# Patient Record
Sex: Female | Born: 1935 | Race: White | Hispanic: No | Smoking: Former smoker
Health system: Southern US, Community
[De-identification: ages and names within clinical notes are randomized; demographics above are authoritative.]

## PROBLEM LIST (undated history)

## (undated) DIAGNOSIS — F329 Major depressive disorder, single episode, unspecified: Secondary | ICD-10-CM

## (undated) DIAGNOSIS — Z860100 Personal history of colon polyps, unspecified: Secondary | ICD-10-CM

## (undated) DIAGNOSIS — Z87440 Personal history of urinary (tract) infections: Secondary | ICD-10-CM

## (undated) DIAGNOSIS — M1990 Unspecified osteoarthritis, unspecified site: Secondary | ICD-10-CM

## (undated) DIAGNOSIS — Z8619 Personal history of other infectious and parasitic diseases: Secondary | ICD-10-CM

## (undated) DIAGNOSIS — F32A Depression, unspecified: Secondary | ICD-10-CM

## (undated) DIAGNOSIS — H409 Unspecified glaucoma: Secondary | ICD-10-CM

## (undated) DIAGNOSIS — K219 Gastro-esophageal reflux disease without esophagitis: Secondary | ICD-10-CM

## (undated) HISTORY — DX: Major depressive disorder, single episode, unspecified: F32.9

## (undated) HISTORY — DX: Depression, unspecified: F32.A

## (undated) HISTORY — PX: OOPHORECTOMY: SHX86

## (undated) HISTORY — DX: Personal history of colonic polyps: Z86.010

## (undated) HISTORY — DX: Unspecified osteoarthritis, unspecified site: M19.90

## (undated) HISTORY — DX: Personal history of urinary (tract) infections: Z87.440

## (undated) HISTORY — DX: Personal history of colon polyps, unspecified: Z86.0100

## (undated) HISTORY — DX: Unspecified glaucoma: H40.9

## (undated) HISTORY — DX: Personal history of other infectious and parasitic diseases: Z86.19

## (undated) HISTORY — PX: EYE SURGERY: SHX253

## (undated) HISTORY — DX: Gastro-esophageal reflux disease without esophagitis: K21.9

---
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
## 1972-05-20 HISTORY — PX: APPENDECTOMY: SHX54

## 1972-05-20 HISTORY — PX: ABDOMINAL HYSTERECTOMY: SHX81

## 1986-05-20 HISTORY — PX: BREAST SURGERY: SHX581

## 1986-05-20 HISTORY — PX: BREAST BIOPSY: SHX20

## 1994-05-20 HISTORY — PX: CHOLECYSTECTOMY: SHX55

## 1994-05-20 HISTORY — PX: NISSEN FUNDOPLICATION: SHX2091

## 1996-05-20 HISTORY — PX: ANKLE SURGERY: SHX546

## 1998-05-20 HISTORY — PX: LAPAROSCOPIC GASTRIC BANDING WITH HIATAL HERNIA REPAIR: SHX6351

## 2004-03-27 ENCOUNTER — Ambulatory Visit: Payer: Self-pay | Admitting: Internal Medicine

## 2005-04-23 ENCOUNTER — Ambulatory Visit: Payer: Self-pay | Admitting: Internal Medicine

## 2005-09-09 ENCOUNTER — Ambulatory Visit: Payer: Self-pay | Admitting: Unknown Physician Specialty

## 2006-04-29 ENCOUNTER — Ambulatory Visit: Payer: Self-pay | Admitting: Internal Medicine

## 2007-05-04 ENCOUNTER — Ambulatory Visit: Payer: Self-pay | Admitting: Internal Medicine

## 2007-05-07 ENCOUNTER — Ambulatory Visit: Payer: Self-pay | Admitting: Internal Medicine

## 2007-11-06 ENCOUNTER — Ambulatory Visit: Payer: Self-pay | Admitting: Internal Medicine

## 2008-05-05 ENCOUNTER — Ambulatory Visit: Payer: Self-pay | Admitting: Internal Medicine

## 2008-12-23 ENCOUNTER — Ambulatory Visit: Payer: Self-pay | Admitting: Unknown Physician Specialty

## 2009-05-08 ENCOUNTER — Ambulatory Visit: Payer: Self-pay | Admitting: Internal Medicine

## 2009-12-26 ENCOUNTER — Ambulatory Visit: Payer: Self-pay | Admitting: Unknown Physician Specialty

## 2010-03-22 ENCOUNTER — Ambulatory Visit: Payer: Self-pay | Admitting: Otolaryngology

## 2010-04-25 ENCOUNTER — Encounter: Payer: Self-pay | Admitting: Otolaryngology

## 2010-05-20 ENCOUNTER — Encounter: Payer: Self-pay | Admitting: Otolaryngology

## 2010-06-11 ENCOUNTER — Ambulatory Visit: Payer: Self-pay | Admitting: Internal Medicine

## 2011-06-13 ENCOUNTER — Ambulatory Visit: Payer: Self-pay | Admitting: Internal Medicine

## 2012-06-15 ENCOUNTER — Ambulatory Visit: Payer: Self-pay | Admitting: Internal Medicine

## 2012-06-18 ENCOUNTER — Ambulatory Visit: Payer: Self-pay | Admitting: Internal Medicine

## 2013-01-15 ENCOUNTER — Ambulatory Visit (INDEPENDENT_AMBULATORY_CARE_PROVIDER_SITE_OTHER): Payer: Medicare Other | Admitting: Internal Medicine

## 2013-01-15 ENCOUNTER — Encounter: Payer: Self-pay | Admitting: Internal Medicine

## 2013-01-15 VITALS — BP 120/60 | HR 80 | Temp 98.2°F | Ht 64.0 in | Wt 178.0 lb

## 2013-01-15 DIAGNOSIS — H409 Unspecified glaucoma: Secondary | ICD-10-CM

## 2013-01-15 DIAGNOSIS — I6529 Occlusion and stenosis of unspecified carotid artery: Secondary | ICD-10-CM

## 2013-01-15 DIAGNOSIS — F3289 Other specified depressive episodes: Secondary | ICD-10-CM

## 2013-01-15 DIAGNOSIS — F329 Major depressive disorder, single episode, unspecified: Secondary | ICD-10-CM

## 2013-01-15 DIAGNOSIS — Z86010 Personal history of colonic polyps: Secondary | ICD-10-CM

## 2013-01-15 DIAGNOSIS — K219 Gastro-esophageal reflux disease without esophagitis: Secondary | ICD-10-CM

## 2013-01-15 DIAGNOSIS — F32A Depression, unspecified: Secondary | ICD-10-CM

## 2013-01-15 DIAGNOSIS — M129 Arthropathy, unspecified: Secondary | ICD-10-CM

## 2013-01-15 DIAGNOSIS — M1990 Unspecified osteoarthritis, unspecified site: Secondary | ICD-10-CM

## 2013-01-18 ENCOUNTER — Encounter: Payer: Self-pay | Admitting: Internal Medicine

## 2013-01-18 DIAGNOSIS — I6529 Occlusion and stenosis of unspecified carotid artery: Secondary | ICD-10-CM | POA: Insufficient documentation

## 2013-01-18 DIAGNOSIS — M1990 Unspecified osteoarthritis, unspecified site: Secondary | ICD-10-CM | POA: Insufficient documentation

## 2013-01-18 DIAGNOSIS — I779 Disorder of arteries and arterioles, unspecified: Secondary | ICD-10-CM | POA: Insufficient documentation

## 2013-01-18 DIAGNOSIS — Z860100 Personal history of colon polyps, unspecified: Secondary | ICD-10-CM | POA: Insufficient documentation

## 2013-01-18 DIAGNOSIS — F329 Major depressive disorder, single episode, unspecified: Secondary | ICD-10-CM | POA: Insufficient documentation

## 2013-01-18 DIAGNOSIS — H409 Unspecified glaucoma: Secondary | ICD-10-CM | POA: Insufficient documentation

## 2013-01-18 DIAGNOSIS — F32A Depression, unspecified: Secondary | ICD-10-CM | POA: Insufficient documentation

## 2013-01-18 DIAGNOSIS — K219 Gastro-esophageal reflux disease without esophagitis: Secondary | ICD-10-CM | POA: Insufficient documentation

## 2013-01-18 NOTE — Progress Notes (Signed)
Subjective:    Patient ID: Bridget Briggs, female    DOB: November 15, 1930, 79 y.o.   MRN: [id number]  HPI 79 year old female with past history of GERD, colonic polyps, glaucoma and depression. She comes in today to follow up on these issues as well as to establish care.  She has been followed by Dr Randa Lynn. Also sees Dr Markham Jordan.  Reflux is controlled.  She has some arthritis.  Right hand is worse.  Is exercising.  Goes to the [facility] three days per week and is walking.  No cardiac symptoms with increased activity or exertion.  Breathing stable.  Sees Dr Alvester Morin for her glaucoma.  No bowel change or urine change.     Past Medical History  Diagnosis Date  . GERD (gastroesophageal reflux disease)   . Arthritis   . Depression   . History of chicken pox   . Glaucoma   . Hx: UTI (urinary tract infection)   . History of colon polyps     Outpatient Encounter Prescriptions as of 01/15/2013  Medication Sig Dispense Refill  . beta carotene w/minerals (OCUVITE) tablet Take 1 tablet by mouth daily.      . calcium-vitamin D (OSCAL WITH D) 500-200 MG-UNIT per tablet Take 1 tablet by mouth daily.      . diphenhydramine-acetaminophen (TYLENOL PM) 25-500 MG TABS Take 1 tablet by mouth at bedtime as needed.      . dorzolamide-timolol (COSOPT) 22.3-6.8 MG/ML ophthalmic solution Place 1 drop into both eyes 2 (two) times daily.      . fluocinolone (SYNALAR) 0.01 % external solution Apply topically 2 (two) times daily as needed.      . latanoprost (XALATAN) 0.005 % ophthalmic solution Place 1 drop into both eyes at bedtime.      . raloxifene (EVISTA) 60 MG tablet Take 60 mg by mouth daily.      . vitamin D, CHOLECALCIFEROL, 400 UNITS tablet Take 400 Units by mouth daily.       No facility-administered encounter medications on file as of 01/15/2013.    Review of Systems Patient denies any headache, lightheadedness or dizziness. No sinus or allergy symptoms.   No chest pain, tightness or palpitations.  No increased shortness  of breath, cough or congestion.  No nausea or vomiting.  Acid reflux controlled.  No abdominal pain or cramping.  No bowel change, such as diarrhea, constipation, BRBPR or melana.  No urine change.   Exercising.  Sees Dr Alvester Morin for her glaucoma.  Previously smoked.  Quit smoking 24 years ago.  Last colonoscopy 2011.  Had one small polyp removed.       Objective:   Physical Exam Filed Vitals:   01/15/13 1535  BP: 120/60  Pulse: 80  Temp: 98.2 F (28.50 C)   79 year old female in no acute distress.   HEENT:  Nares- clear.  Oropharynx - without lesions. NECK:  Supple.  Nontender.  No audible bruit.  HEART:  Appears to be regular. LUNGS:  No crackles or wheezing audible.  Respirations even and unlabored.  RADIAL PULSE:  Equal bilaterally.   ABDOMEN:  Soft, nontender.  Bowel sounds present and normal.  No audible abdominal bruit.    EXTREMITIES:  No increased edema present.  DP pulses palpable and equal bilaterally.          Assessment & Plan:  HEALTH MAINTENANCE.  Schedule her for a physical when due.  Obtain outside records for review.  Mammogram 06/18/12 (per her report - ok).  I spent 30 minutes with this patient and more than 50% of the time was spent in consultation regarding the above.

## 2013-01-18 NOTE — Assessment & Plan Note (Signed)
Reflux controlled

## 2013-01-18 NOTE — Assessment & Plan Note (Signed)
Previously noted to have some carotid stenosis.  Will schedule a carotid ultrasound to further evaluate.

## 2013-01-18 NOTE — Assessment & Plan Note (Signed)
Followed by Dr Bell.   

## 2013-01-18 NOTE — Assessment & Plan Note (Signed)
Right hand is worse.  Tylenol as directed.  Desires no further w/up at this point.

## 2013-01-18 NOTE — Assessment & Plan Note (Signed)
Doing well on no medications.  Follow.

## 2013-01-18 NOTE — Assessment & Plan Note (Signed)
Last colonoscopy 2011.  One polyp removed.  Followed by Dr Elliot.   

## 2013-03-23 ENCOUNTER — Encounter: Payer: Self-pay | Admitting: Internal Medicine

## 2013-04-19 ENCOUNTER — Ambulatory Visit (INDEPENDENT_AMBULATORY_CARE_PROVIDER_SITE_OTHER): Payer: Medicare Other | Admitting: Adult Health

## 2013-04-19 ENCOUNTER — Telehealth: Payer: Self-pay | Admitting: *Deleted

## 2013-04-19 ENCOUNTER — Other Ambulatory Visit (INDEPENDENT_AMBULATORY_CARE_PROVIDER_SITE_OTHER): Payer: Medicare Other

## 2013-04-19 VITALS — BP 126/74 | HR 82 | Temp 98.3°F | Resp 16 | Wt 178.0 lb

## 2013-04-19 DIAGNOSIS — K219 Gastro-esophageal reflux disease without esophagitis: Secondary | ICD-10-CM

## 2013-04-19 DIAGNOSIS — F32A Depression, unspecified: Secondary | ICD-10-CM

## 2013-04-19 DIAGNOSIS — I6529 Occlusion and stenosis of unspecified carotid artery: Secondary | ICD-10-CM

## 2013-04-19 DIAGNOSIS — Z86010 Personal history of colonic polyps: Secondary | ICD-10-CM

## 2013-04-19 DIAGNOSIS — F329 Major depressive disorder, single episode, unspecified: Secondary | ICD-10-CM

## 2013-04-19 DIAGNOSIS — Z9109 Other allergy status, other than to drugs and biological substances: Secondary | ICD-10-CM | POA: Insufficient documentation

## 2013-04-19 DIAGNOSIS — J329 Chronic sinusitis, unspecified: Secondary | ICD-10-CM

## 2013-04-19 DIAGNOSIS — F3289 Other specified depressive episodes: Secondary | ICD-10-CM

## 2013-04-19 DIAGNOSIS — M1990 Unspecified osteoarthritis, unspecified site: Secondary | ICD-10-CM

## 2013-04-19 LAB — COMPREHENSIVE METABOLIC PANEL
ALT: 17 U/L (ref 0–35)
Alkaline Phosphatase: 55 U/L (ref 39–117)
BUN: 14 mg/dL (ref 6–23)
Chloride: 106 mEq/L (ref 96–112)
Creatinine, Ser: 0.9 mg/dL (ref 0.4–1.2)
Glucose, Bld: 91 mg/dL (ref 70–99)
Total Bilirubin: 0.6 mg/dL (ref 0.3–1.2)

## 2013-04-19 LAB — LIPID PANEL
Cholesterol: 191 mg/dL (ref 0–200)
HDL: 60.5 mg/dL (ref >39.00)
LDL Cholesterol: 107 mg/dL — ABNORMAL HIGH (ref 0–99)
VLDL: 23.2 mg/dL (ref 0.0–40.0)

## 2013-04-19 LAB — CBC WITH DIFFERENTIAL/PLATELET
Basophils Absolute: 0 10*3/uL (ref 0.0–0.1)
Basophils Relative: 0.4 % (ref 0.0–3.0)
Eosinophils Relative: 1.1 % (ref 0.0–5.0)
HCT: 43.9 % (ref 36.0–46.0)
Lymphs Abs: 2.1 10*3/uL (ref 0.7–4.0)
MCV: 87.3 fl (ref 78.0–100.0)
Monocytes Absolute: 0.6 10*3/uL (ref 0.1–1.0)
RBC: 5.03 Mil/uL (ref 3.87–5.11)
WBC: 9.1 10*3/uL (ref 4.5–10.5)

## 2013-04-19 LAB — TSH: TSH: 4.14 u[IU]/mL (ref 0.35–5.50)

## 2013-04-19 MED ORDER — FLUTICASONE PROPIONATE 50 MCG/ACT NA SUSP: 2.0000 | Freq: Every day | NASAL | Status: DC

## 2013-04-19 NOTE — Patient Instructions (Signed)
  Start flonase nasal spray 2 sprays into each nostril daily. Use only for one week.  Gargle with salt water solution or use chloraseptic spray for your throat. You can also try the lozenges.  Tylenol for general discomfort.  If you develop a cough you can use either Robitussin or Delsym  If you develop a fever or if your secretions become green colored please let us know.

## 2013-04-19 NOTE — Assessment & Plan Note (Signed)
Suspect post nasal drip causing sore throat. Salt water gargles, chloraseptic spray or lozenges for sore throat relief, tylenol for general discomfort. May take OTC cough medication. Will try flonase nasal spray for short term.

## 2013-04-19 NOTE — Progress Notes (Signed)
Pre visit review using our clinic review tool, if applicable. No additional management support is needed unless otherwise documented below in the visit note. 

## 2013-04-19 NOTE — Progress Notes (Signed)
   Subjective:    Patient ID: Bridget Briggs, female    DOB: 08-09-1930, 79 y.o.   MRN: [id number]  HPI Patient is a pleasant 79 year old female who presents to clinic with complaints of a sore throat and nasal congestion. She reports that the pain in her throat woke her up during the night. She has a mild cough. She denies fever or chills.   Current Outpatient Prescriptions on File Prior to Visit  Medication Sig Dispense Refill  . beta carotene w/minerals (OCUVITE) tablet Take 1 tablet by mouth daily.      . calcium-vitamin D (OSCAL WITH D) 500-200 MG-UNIT per tablet Take 1 tablet by mouth daily.      . diphenhydramine-acetaminophen (TYLENOL PM) 25-500 MG TABS Take 1 tablet by mouth at bedtime as needed.      . dorzolamide-timolol (COSOPT) 22.3-6.8 MG/ML ophthalmic solution Place 1 drop into both eyes 2 (two) times daily.      . fluocinolone (SYNALAR) 0.01 % external solution Apply topically 2 (two) times daily as needed.      . latanoprost (XALATAN) 0.005 % ophthalmic solution Place 1 drop into both eyes at bedtime.      . raloxifene (EVISTA) 60 MG tablet Take 60 mg by mouth daily.      . vitamin D, CHOLECALCIFEROL, 400 UNITS tablet Take 400 Units by mouth daily.       No current facility-administered medications on file prior to visit.    Review of Systems  Constitutional: Negative for fever and chills.  HENT: Positive for congestion, postnasal drip, rhinorrhea and sore throat.   Respiratory: Positive for cough. Negative for wheezing.        Objective:   Physical Exam  Constitutional: She is oriented to person, place, and time. She appears well-developed and well-nourished. No distress.  HENT:  Head: Normocephalic and atraumatic.  Left Ear: External ear normal.  Right ear canal with cerumen build up. TM not visualized. Mild pharyngeal erythema. No exudate.  Cardiovascular: Normal rate, regular rhythm and normal heart sounds.   Pulmonary/Chest: Effort normal and breath sounds  normal. No respiratory distress. She has no wheezes. She has no rales.  Lymphadenopathy:    She has no cervical adenopathy.  Neurological: She is alert and oriented to person, place, and time.  Skin: Skin is warm and dry.  Psychiatric: She has a normal mood and affect. Her behavior is normal. Judgment and thought content normal.    BP 126/74  Pulse 82  Temp(Src) 98.3 F (36.8 C) (Oral)  Resp 16  Wt 178 lb (80.74 kg)  SpO2 94%       Assessment & Plan:

## 2013-04-19 NOTE — Telephone Encounter (Signed)
What labs and dx?  

## 2013-04-19 NOTE — Telephone Encounter (Signed)
Orders placed for labs

## 2013-04-20 ENCOUNTER — Encounter: Payer: Self-pay | Admitting: *Deleted

## 2013-04-29 ENCOUNTER — Encounter: Payer: Self-pay | Admitting: Internal Medicine

## 2013-04-29 ENCOUNTER — Ambulatory Visit (INDEPENDENT_AMBULATORY_CARE_PROVIDER_SITE_OTHER): Payer: Medicare Other | Admitting: Internal Medicine

## 2013-04-29 VITALS — BP 130/60 | HR 86 | Temp 97.8°F | Ht 64.0 in | Wt 179.2 lb

## 2013-04-29 DIAGNOSIS — F3289 Other specified depressive episodes: Secondary | ICD-10-CM

## 2013-04-29 DIAGNOSIS — F329 Major depressive disorder, single episode, unspecified: Secondary | ICD-10-CM

## 2013-04-29 DIAGNOSIS — K219 Gastro-esophageal reflux disease without esophagitis: Secondary | ICD-10-CM

## 2013-04-29 DIAGNOSIS — Z9109 Other allergy status, other than to drugs and biological substances: Secondary | ICD-10-CM

## 2013-04-29 DIAGNOSIS — Z860100 Personal history of colon polyps, unspecified: Secondary | ICD-10-CM

## 2013-04-29 DIAGNOSIS — I6529 Occlusion and stenosis of unspecified carotid artery: Secondary | ICD-10-CM

## 2013-04-29 DIAGNOSIS — M129 Arthropathy, unspecified: Secondary | ICD-10-CM

## 2013-04-29 DIAGNOSIS — M1990 Unspecified osteoarthritis, unspecified site: Secondary | ICD-10-CM

## 2013-04-29 DIAGNOSIS — H409 Unspecified glaucoma: Secondary | ICD-10-CM

## 2013-04-29 DIAGNOSIS — F32A Depression, unspecified: Secondary | ICD-10-CM

## 2013-04-29 DIAGNOSIS — Z1239 Encounter for other screening for malignant neoplasm of breast: Secondary | ICD-10-CM

## 2013-04-29 MED ORDER — RALOXIFENE HCL 60 MG PO TABS: 60.0000 mg | ORAL_TABLET | Freq: Every day | ORAL | Status: DC

## 2013-04-29 NOTE — Progress Notes (Signed)
Pre-visit discussion using our clinic review tool. No additional management support is needed unless otherwise documented below in the visit note.  

## 2013-04-29 NOTE — Patient Instructions (Signed)
Robitussin as directed.  Saline nasal spray - flush nose at least 2-3x/day.  Flonase nasal spray.  2 sprays each nostril one time per day.  Take in the evening.

## 2013-04-29 NOTE — Progress Notes (Signed)
Subjective:    Patient ID: Bridget Briggs, female    DOB: 01-12-1931, 79 y.o.   MRN: [id number]  HPI 79 year old female with past history of GERD, colonic polyps, glaucoma and depression. She comes in today to follow up on these issues as well as for a complete physical exam.   Sees Dr Markham Jordan.  Reflux is controlled.  She has some arthritis.  Right hand is worse.  Is exercising.  Goes to the [facility] three days per week and is walking.  No cardiac symptoms with increased activity or exertion.  Breathing stable.  Sees Dr Alvester Morin for her glaucoma.  No bowel change or urine change.  Some increased drainage.  Minimal cough.  Clear mucus.  No fever.  No chest congestion or tightness.  No sob.  No wheezing.     Past Medical History  Diagnosis Date  . GERD (gastroesophageal reflux disease)   . Arthritis   . Depression   . History of chicken pox   . Glaucoma   . Hx: UTI (urinary tract infection)   . History of colon polyps     Outpatient Encounter Prescriptions as of 04/29/2013  Medication Sig  . beta carotene w/minerals (OCUVITE) tablet Take 1 tablet by mouth daily.  . calcium-vitamin D (OSCAL WITH D) 500-200 MG-UNIT per tablet Take 1 tablet by mouth daily.  . diphenhydramine-acetaminophen (TYLENOL PM) 25-500 MG TABS Take 1 tablet by mouth at bedtime as needed.  . dorzolamide-timolol (COSOPT) 22.3-6.8 MG/ML ophthalmic solution Place 1 drop into both eyes 2 (two) times daily.  . fluocinolone (SYNALAR) 0.01 % external solution Apply topically 2 (two) times daily as needed.  . fluticasone (FLONASE) 50 MCG/ACT nasal spray Place 2 sprays into both nostrils daily.  Marland Kitchen latanoprost (XALATAN) 0.005 % ophthalmic solution Place 1 drop into both eyes at bedtime.  . raloxifene (EVISTA) 60 MG tablet Take 60 mg by mouth daily.  . vitamin D, CHOLECALCIFEROL, 400 UNITS tablet Take 400 Units by mouth daily.    Review of Systems Patient denies any headache, lightheadedness or dizziness. Some drainage and cough as  outlined.   No chest pain, tightness or palpitations.  No increased shortness of breath.  No nausea or vomiting.  Acid reflux controlled.  No abdominal pain or cramping.  No bowel change, such as diarrhea, constipation, BRBPR or melana.  No urine change.   Exercising.  Sees Dr Alvester Morin for her glaucoma.  Previously smoked.  Quit smoking 24 years ago.  Last colonoscopy 2011.  Had one small polyp removed.       Objective:   Physical Exam  Filed Vitals:   04/29/13 1331  BP: 130/60  Pulse: 86  Temp: 97.8 F (36.6 C)   Blood pressure recheck:  132/64, pulse 41  79 year old female in no acute distress.   HEENT:  Nares- clear.  Oropharynx - without lesions. NECK:  Supple.  Nontender.  No audible bruit.  HEART:  Appears to be regular. LUNGS:  No crackles or wheezing audible.  Respirations even and unlabored.  RADIAL PULSE:  Equal bilaterally.    BREASTS:  No nipple discharge or nipple retraction present.  Could not appreciate any distinct nodules or axillary adenopathy.  ABDOMEN:  Soft, nontender.  Bowel sounds present and normal.  No audible abdominal bruit.  GU: not performed.  EXTREMITIES:  No increased edema present.  DP pulses palpable and equal bilaterally.          Assessment & Plan:  HEALTH MAINTENANCE.  Physical  today.  Mammogram 06/18/12 (per her report - ok).  Schedule a f/u mammogram.

## 2013-05-02 ENCOUNTER — Encounter: Payer: Self-pay | Admitting: Internal Medicine

## 2013-05-02 NOTE — Assessment & Plan Note (Signed)
Right hand is worse.  Tylenol as directed.  Desires no further w/up at this point.

## 2013-05-02 NOTE — Assessment & Plan Note (Signed)
Doing well on no medications.  Follow.

## 2013-05-02 NOTE — Assessment & Plan Note (Signed)
Followed by Dr Bell.   

## 2013-05-02 NOTE — Assessment & Plan Note (Signed)
Reflux controlled

## 2013-05-02 NOTE — Assessment & Plan Note (Signed)
Last colonoscopy 2011.  One polyp removed.  Followed by Dr Elliot.   

## 2013-05-02 NOTE — Assessment & Plan Note (Addendum)
Previously noted to have some carotid stenosis.  Follow up carotid ultrasound 02/06/13 - revealed no hemodynamically significant stenosis.  (left internal 1-49%).     

## 2013-05-02 NOTE — Assessment & Plan Note (Signed)
Robitussin as directed.  Saline nasal spray and Flonase as directed.  Follow.  Notify me if symptoms worsen or do not resolve.

## 2013-06-22 ENCOUNTER — Ambulatory Visit: Payer: Self-pay | Admitting: Internal Medicine

## 2013-06-22 LAB — HM MAMMOGRAPHY: HM Mammogram: NEGATIVE

## 2013-06-24 ENCOUNTER — Encounter: Payer: Self-pay | Admitting: Internal Medicine

## 2013-10-28 ENCOUNTER — Ambulatory Visit (INDEPENDENT_AMBULATORY_CARE_PROVIDER_SITE_OTHER): Payer: Medicare Other | Admitting: Internal Medicine

## 2013-10-28 ENCOUNTER — Encounter: Payer: Self-pay | Admitting: Internal Medicine

## 2013-10-28 ENCOUNTER — Ambulatory Visit: Payer: Self-pay | Admitting: Internal Medicine

## 2013-10-28 VITALS — BP 120/70 | HR 78 | Temp 98.2°F | Ht 64.0 in | Wt 177.0 lb

## 2013-10-28 DIAGNOSIS — Z860100 Personal history of colon polyps, unspecified: Secondary | ICD-10-CM

## 2013-10-28 DIAGNOSIS — R5381 Other malaise: Secondary | ICD-10-CM

## 2013-10-28 DIAGNOSIS — F3289 Other specified depressive episodes: Secondary | ICD-10-CM

## 2013-10-28 DIAGNOSIS — Z9109 Other allergy status, other than to drugs and biological substances: Secondary | ICD-10-CM

## 2013-10-28 DIAGNOSIS — R209 Unspecified disturbances of skin sensation: Secondary | ICD-10-CM

## 2013-10-28 DIAGNOSIS — R5383 Other fatigue: Secondary | ICD-10-CM

## 2013-10-28 DIAGNOSIS — R079 Chest pain, unspecified: Secondary | ICD-10-CM

## 2013-10-28 DIAGNOSIS — F32A Depression, unspecified: Secondary | ICD-10-CM

## 2013-10-28 DIAGNOSIS — K219 Gastro-esophageal reflux disease without esophagitis: Secondary | ICD-10-CM

## 2013-10-28 DIAGNOSIS — R208 Other disturbances of skin sensation: Secondary | ICD-10-CM

## 2013-10-28 DIAGNOSIS — F329 Major depressive disorder, single episode, unspecified: Secondary | ICD-10-CM

## 2013-10-28 DIAGNOSIS — I6529 Occlusion and stenosis of unspecified carotid artery: Secondary | ICD-10-CM

## 2013-10-28 MED ORDER — SERTRALINE HCL 50 MG PO TABS: 50.0000 mg | ORAL_TABLET | Freq: Every day | ORAL | Status: DC

## 2013-10-28 NOTE — Progress Notes (Signed)
Pre visit review using our clinic review tool, if applicable. No additional management support is needed unless otherwise documented below in the visit note. 

## 2013-10-28 NOTE — Patient Instructions (Signed)
Take 1/2 tablet x 1 week and then increase to 1 whole tablet daily

## 2013-10-29 LAB — CBC WITH DIFFERENTIAL/PLATELET
BASOS ABS: 0 10*3/uL (ref 0.0–0.1)
Basophils Relative: 0.6 % (ref 0.0–3.0)
EOS ABS: 0.1 10*3/uL (ref 0.0–0.7)
Eosinophils Relative: 1.1 % (ref 0.0–5.0)
HCT: 41.4 % (ref 36.0–46.0)
Hemoglobin: 13.8 g/dL (ref 12.0–15.0)
Lymphocytes Relative: 32.4 % (ref 12.0–46.0)
Lymphs Abs: 2.8 10*3/uL (ref 0.7–4.0)
MCHC: 33.3 g/dL (ref 30.0–36.0)
MCV: 86.3 fl (ref 78.0–100.0)
MONO ABS: 0.6 10*3/uL (ref 0.1–1.0)
Monocytes Relative: 6.5 % (ref 3.0–12.0)
NEUTROS PCT: 59.4 % (ref 43.0–77.0)
Neutro Abs: 5.1 10*3/uL (ref 1.4–7.7)
Platelets: 228 10*3/uL (ref 150.0–400.0)
RBC: 4.8 Mil/uL (ref 3.87–5.11)
RDW: 13.8 % (ref 11.5–15.5)
WBC: 8.5 10*3/uL (ref 4.0–10.5)

## 2013-10-29 LAB — COMPREHENSIVE METABOLIC PANEL
ALK PHOS: 62 U/L (ref 39–117)
ALT: 23 U/L (ref 0–35)
AST: 25 U/L (ref 0–37)
Albumin: 3.8 g/dL (ref 3.5–5.2)
BUN: 12 mg/dL (ref 6–23)
CO2: 28 mEq/L (ref 19–32)
Calcium: 9.4 mg/dL (ref 8.4–10.5)
Chloride: 103 mEq/L (ref 96–112)
Creatinine, Ser: 0.8 mg/dL (ref 0.4–1.2)
GFR: 75.39 mL/min (ref >60.00)
Glucose, Bld: 72 mg/dL (ref 70–99)
POTASSIUM: 4.6 meq/L (ref 3.5–5.1)
SODIUM: 137 meq/L (ref 135–145)
TOTAL PROTEIN: 6.7 g/dL (ref 6.0–8.3)
Total Bilirubin: 0.5 mg/dL (ref 0.2–1.2)

## 2013-10-29 LAB — TSH: TSH: 3.1 u[IU]/mL (ref 0.35–4.50)

## 2013-11-01 ENCOUNTER — Encounter: Payer: Self-pay | Admitting: Internal Medicine

## 2013-11-01 ENCOUNTER — Telehealth: Payer: Self-pay | Admitting: Internal Medicine

## 2013-11-01 DIAGNOSIS — R079 Chest pain, unspecified: Secondary | ICD-10-CM | POA: Insufficient documentation

## 2013-11-01 DIAGNOSIS — R5383 Other fatigue: Secondary | ICD-10-CM | POA: Insufficient documentation

## 2013-11-01 DIAGNOSIS — R208 Other disturbances of skin sensation: Secondary | ICD-10-CM | POA: Insufficient documentation

## 2013-11-01 DIAGNOSIS — R9389 Abnormal findings on diagnostic imaging of other specified body structures: Secondary | ICD-10-CM

## 2013-11-01 NOTE — Telephone Encounter (Signed)
Patient Information:  Caller Name: Sindhu  Phone: [phone]  Patient: Bridget Briggs, Bridget Briggs  Gender: Female  DOB: 1931/01/08  Age: 80 Years  PCP: Einar Pheasant  Office Follow Up:  Does the office need to follow up with this patient?: Yes  Instructions For The Office: Patient requesting call regarding recent chest xray  RN Note:  Patient calling regarding to follow up from visit on 10/28/13.  States "I told Dr. Nicki Reaper at that time I was having burning in my legs and chest, but now it's moved into my arms."  Denies SOB or any pain.  Describes as burning feeling that is mild.  Able to preform ADL's. EKG & Chest xray done 10/28/13.  Patient aware EKG WNL, but would like a call back regarding results of xray.  Symptoms  Reason For Call & Symptoms: follow up from visit dated 10/28/13.  Reviewed Health History In EMR: Yes  Reviewed Medications In EMR: Yes  Reviewed Allergies In EMR: Yes  Reviewed Surgeries / Procedures: Yes  Date of Onset of Symptoms: 10/04/2013  Guideline(s) Used:  Leg Pain  Disposition Per Guideline:   See Within 2 Weeks in Office  Reason For Disposition Reached:   Mild pain persists > 7 days  Advice Given:  Call Back If:  Moderate pain (e.g., limping) lasts more than 3 days  Mild pain lasts more than 7 days  You become worse.  Patient Will Follow Care Advice:  YES

## 2013-11-01 NOTE — Progress Notes (Signed)
Subjective:    Patient ID: Bridget Briggs, female    DOB: 1930/06/07, 80 y.o.   MRN: [id number]  HPI 80 year old female with past history of GERD, colonic polyps, glaucoma and depression. She comes in today for a scheduled follow up.  Sees Dr Tiffany Kocher.  Reflux is controlled.  She has some arthritis.  Has been exercising.  Going to the [facility] three days per week and has been walking.  She does report that starting a few weeks ago, she just hasn't felt as well.  Some increased fatigue.  No cardiac symptoms with increased activity or exertion.   Does report some chest pain.  Noticed on the way over here.  No pain currently.  Vague pain.  Appears to be reproducible.  Breathing stable.  No sob.  Sees Dr Gloriann Loan for her glaucoma.  No bowel change or urine change.  Went to [facility] a couple of weeks ago for evaluation.  Described a hot sensation in her legs.  States do not feel hot to touch, but to her they feel hot.  [facility] evaluated and instructed her to stop Evista.  Denies any increased warmth or erythema.  No increased swelling.  Does report some increased depression.  States she has been thinking about her husband more lately.  He died a while ago.  Does feel she needs something to help "level her off".  Sleeping ok.  Eating ok.      Past Medical History  Diagnosis Date  . GERD (gastroesophageal reflux disease)   . Arthritis   . Depression   . History of chicken pox   . Glaucoma   . Hx: UTI (urinary tract infection)   . History of colon polyps     Outpatient Encounter Prescriptions as of 10/28/2013  Medication Sig  . beta carotene w/minerals (OCUVITE) tablet Take 1 tablet by mouth daily.  . calcium-vitamin D (OSCAL WITH D) 500-200 MG-UNIT per tablet Take 1 tablet by mouth daily.  . diphenhydramine-acetaminophen (TYLENOL PM) 25-500 MG TABS Take 1 tablet by mouth at bedtime as needed.  . dorzolamide-timolol (COSOPT) 22.3-6.8 MG/ML ophthalmic solution Place 1 drop into both eyes 2 (two) times daily.  .  fluocinolone (SYNALAR) 0.01 % external solution Apply topically 2 (two) times daily as needed.  . fluticasone (FLONASE) 50 MCG/ACT nasal spray Place 2 sprays into both nostrils daily.  Marland Kitchen latanoprost (XALATAN) 0.005 % ophthalmic solution Place 1 drop into both eyes at bedtime.  . vitamin D, CHOLECALCIFEROL, 400 UNITS tablet Take 400 Units by mouth daily.  . sertraline (ZOLOFT) 50 MG tablet Take 1 tablet (50 mg total) by mouth daily.  . [DISCONTINUED] raloxifene (EVISTA) 60 MG tablet Take 1 tablet (60 mg total) by mouth daily.    Review of Systems Patient denies any headache, lightheadedness or dizziness.  No sinus or allergy symptoms.   No palpitations.  Chest pain as outlined.  No increased shortness of breath.  No nausea or vomiting.  Acid reflux controlled.  No abdominal pain or cramping.  No bowel change, such as diarrhea, constipation, BRBPR or melana.  No urine change.   Exercising.  Increased fatigue.  Hot sensation involving her legs.  No rash.  Sees Dr Gloriann Loan for her glaucoma.  Previously smoked.  Quit smoking 24 years ago.  Last colonoscopy 2011.  Had one small polyp removed.       Objective:   Physical Exam  Filed Vitals:   10/28/13 1431  BP: 120/70  Pulse: 78  Temp:  98.2 F (36.8 C)   Blood pressure recheck:  58/45  80 year old female in no acute distress.   HEENT:  Nares- clear.  Oropharynx - without lesions. NECK:  Supple.  Nontender.  No audible bruit.  HEART:  Appears to be regular. LUNGS:  No crackles or wheezing audible.  Respirations even and unlabored.  RADIAL PULSE:  Equal bilaterally.   ABDOMEN:  Soft, nontender.  Bowel sounds present and normal.  No audible abdominal bruit.  EXTREMITIES:  No increased edema present.   SKIN:  No increased erythema or warmth.            Assessment & Plan:  HEALTH MAINTENANCE.  Physical 04/29/13.  Mammogram 06/22/13 - [facility] I.    I spent 40 minutes with the patient and more than 50% of the time was spent in consultation  regarding the above (specifically plan for further treatment of her depression, chest pain and fatigue).

## 2013-11-01 NOTE — Assessment & Plan Note (Signed)
Previously noted to have some carotid stenosis.  Follow up carotid ultrasound 02/06/13 - revealed no hemodynamically significant stenosis.  (left internal 1-49%).

## 2013-11-01 NOTE — Assessment & Plan Note (Signed)
Last colonoscopy 2011.  One polyp removed.  Followed by Dr Elliot.   

## 2013-11-01 NOTE — Telephone Encounter (Signed)
Pt notified of cxr - revealed a vague nodular density in the left lung base.  CT of the chest is recommended for further evaluation.  Pt agrees.  Order placed for CT chest.

## 2013-11-01 NOTE — Assessment & Plan Note (Addendum)
Symptoms as outlined.  Does not occur with increased activity or exertion.  Somewhat reproducible on exam.  EKG obtained and revealed SR with non specific T wave changes.  Discussed further cardiac w/up.  She elects to follow for now.  Will follow.  Get her back in soon to reassess.  Any change or worsening symptoms, she is to be reevaluated.  Will check cxr.

## 2013-11-01 NOTE — Telephone Encounter (Signed)
Spoke to pt.  She denies any chest pain or tightness.  No sob.  Still exercising.  Did start zoloft.  Only on since 10/28/13.  Describes her legs feeling hot and states has now moved to her arms and chest.  Has fever.  No cough or congestion.  Declines evaluation this pm.  Explained I would not be in the office the rest of the week.  Agreed to someone else within [place].  Please schedule appt for further evaluation.  If no appt available - to acute care.  I did discuss her cxr results.  CT chest ordered.   Thanks.

## 2013-11-01 NOTE — Assessment & Plan Note (Signed)
Unclear etiology.  Is not like a numbness or tingling sensation.  Describes her legs as feeling hot.  Not hot to touch.  Check routine labs.  No evidence of decreased circulation.  No increased swelling, erythema or warmth.

## 2013-11-01 NOTE — Assessment & Plan Note (Signed)
Increased problems recently.  Sleeping ok.  Discussed at length with her today.  Will start zoloft 25mg  x 1 week and then increase to 50mg  q day.  Follow closely.  Get her back in soon to reassess.

## 2013-11-01 NOTE — Assessment & Plan Note (Signed)
Persistent.  Probably multifactorial.  Treat depression as outlined.  Check cbc, met c and tsh.  Check cxr.    

## 2013-11-01 NOTE — Assessment & Plan Note (Signed)
Controlled.  Follow.   

## 2013-11-01 NOTE — Assessment & Plan Note (Signed)
Reflux controlled

## 2013-11-02 ENCOUNTER — Ambulatory Visit (INDEPENDENT_AMBULATORY_CARE_PROVIDER_SITE_OTHER): Payer: Medicare Other | Admitting: Adult Health

## 2013-11-02 ENCOUNTER — Telehealth: Payer: Self-pay | Admitting: *Deleted

## 2013-11-02 ENCOUNTER — Encounter: Payer: Self-pay | Admitting: Adult Health

## 2013-11-02 VITALS — BP 124/68 | HR 60 | Temp 98.1°F | Resp 14 | Ht 64.0 in | Wt 176.2 lb

## 2013-11-02 DIAGNOSIS — R509 Fever, unspecified: Secondary | ICD-10-CM

## 2013-11-02 DIAGNOSIS — R9389 Abnormal findings on diagnostic imaging of other specified body structures: Secondary | ICD-10-CM | POA: Insufficient documentation

## 2013-11-02 MED ORDER — ONDANSETRON HCL 4 MG PO TABS: 4.0000 mg | ORAL_TABLET | Freq: Three times a day (TID) | ORAL | Status: DC | PRN

## 2013-11-02 MED ORDER — PROMETHAZINE HCL 12.5 MG PO TABS: 12.5000 mg | ORAL_TABLET | Freq: Three times a day (TID) | ORAL | Status: DC | PRN

## 2013-11-02 MED ORDER — AMOXICILLIN-POT CLAVULANATE 875-125 MG PO TABS: 1.0000 | ORAL_TABLET | Freq: Two times a day (BID) | ORAL | Status: DC

## 2013-11-02 NOTE — Telephone Encounter (Signed)
Spoke with pt today, temp 99.9 this morning. No change in other symptoms. Appt scheduled tomorrow with Raquel for evaluation per Dr. Nicki Reaper. Advised to call back today if symptoms worsen or change, and to be evaluated in UC today, pt verbalized understanding.

## 2013-11-02 NOTE — Progress Notes (Signed)
   Subjective:    Patient ID: Bridget Briggs, female    DOB: 1930-10-30, 81 y.o.   MRN: [id number]  HPI  Pleasant 80 yo caucasian female presents today for f/u of fever. States that she does "not feel real great, but has felt worse before." Temperature at home last night via ear was 100.1. She did take Tylenol last night. Temperature this am was 99.9. Has warm feeling in her legs that progresses upwards. Initially believed to be from Evista, however it was discontinued 3-4 weeks ago. Has been hurting in the chest, but EKG and labs were normal per Dr. Nicki Reaper. Does have new onset mild nausea and a small amount of congestion that she relates to possible allergies. Denies any additional treatments or things that make it better or worse. Recently began having post nasal drip.   Past Medical History  Diagnosis Date  . GERD (gastroesophageal reflux disease)   . Arthritis   . Depression   . History of chicken pox   . Glaucoma   . Hx: UTI (urinary tract infection)   . History of colon polyps     Current Outpatient Prescriptions on File Prior to Visit  Medication Sig Dispense Refill  . beta carotene w/minerals (OCUVITE) tablet Take 1 tablet by mouth daily.      . calcium-vitamin D (OSCAL WITH D) 500-200 MG-UNIT per tablet Take 1 tablet by mouth daily.      . diphenhydramine-acetaminophen (TYLENOL PM) 25-500 MG TABS Take 1 tablet by mouth at bedtime as needed.      . dorzolamide-timolol (COSOPT) 22.3-6.8 MG/ML ophthalmic solution Place 1 drop into both eyes 2 (two) times daily.      . fluocinolone (SYNALAR) 0.01 % external solution Apply topically 2 (two) times daily as needed.      . fluticasone (FLONASE) 50 MCG/ACT nasal spray Place 2 sprays into both nostrils daily.  16 g  6  . latanoprost (XALATAN) 0.005 % ophthalmic solution Place 1 drop into both eyes at bedtime.      . sertraline (ZOLOFT) 50 MG tablet Take 1 tablet (50 mg total) by mouth daily.  30 tablet  1  . vitamin D, CHOLECALCIFEROL, 400  UNITS tablet Take 400 Units by mouth daily.       No current facility-administered medications on file prior to visit.     Review of Systems  Constitutional: Positive for fever and fatigue.  HENT: Positive for postnasal drip.   Respiratory: Negative for cough, shortness of breath and wheezing.   Cardiovascular: Negative for chest pain.  Gastrointestinal: Positive for nausea.  All other systems reviewed and are negative.  See HPI. Other all negative.     Objective:  BP 124/68  Pulse 60  Temp(Src) 98.1 F (36.7 C) (Oral)  Resp 14  Wt 176 lb 4 oz (79.946 kg)  SpO2 97%   Physical Exam  Constitutional: She appears well-developed and well-nourished. No distress.  Cardiovascular: Normal rate, regular rhythm, normal heart sounds and intact distal pulses.   Pulmonary/Chest: Effort normal and breath sounds normal.  Skin: Skin is warm and dry.  Lower extremity skin temperature is body temperature.       Assessment & Plan:   1. Fever, unspecified Start Augmentin bid x 7 days. Tylenol for fever and general discomfort. Zofran for nausea. RTC if no improvement within 4-5 days.

## 2013-11-02 NOTE — Telephone Encounter (Signed)
Bridget Briggs had a cancellation for today, called pt and rescheduled appt to today at 10:15

## 2013-11-02 NOTE — Progress Notes (Signed)
Pre visit review using our clinic review tool, if applicable. No additional management support is needed unless otherwise documented below in the visit note. 

## 2013-11-02 NOTE — Telephone Encounter (Signed)
Received fax from [place] that PA needed for Zofran. Ok to change to Phenergan 12.5 mg per Raquel. Rx sent to pharmacy by escript

## 2013-11-02 NOTE — Patient Instructions (Signed)
  Start Augmentin twice a day for 7 days.  Zofran 4 mg every 8 hours as needed for nausea.  Continue Tylenol as needed for fever.  Drink plenty of fluids.  Call if her symptoms are not improved within 4 or 5 days otherwise followup with Dr. Nicki Reaper as planned on June 26.

## 2013-11-03 ENCOUNTER — Ambulatory Visit: Payer: Medicare Other | Admitting: Adult Health

## 2013-11-15 ENCOUNTER — Encounter: Payer: Self-pay | Admitting: Internal Medicine

## 2013-11-15 ENCOUNTER — Ambulatory Visit: Payer: Self-pay | Admitting: Internal Medicine

## 2013-11-15 ENCOUNTER — Ambulatory Visit (INDEPENDENT_AMBULATORY_CARE_PROVIDER_SITE_OTHER): Payer: Medicare Other | Admitting: Internal Medicine

## 2013-11-15 VITALS — BP 126/60 | HR 62 | Temp 97.9°F | Resp 16 | Ht 64.0 in | Wt 175.2 lb

## 2013-11-15 DIAGNOSIS — F3289 Other specified depressive episodes: Secondary | ICD-10-CM

## 2013-11-15 DIAGNOSIS — R208 Other disturbances of skin sensation: Secondary | ICD-10-CM

## 2013-11-15 DIAGNOSIS — R9389 Abnormal findings on diagnostic imaging of other specified body structures: Secondary | ICD-10-CM

## 2013-11-15 DIAGNOSIS — F329 Major depressive disorder, single episode, unspecified: Secondary | ICD-10-CM

## 2013-11-15 DIAGNOSIS — R079 Chest pain, unspecified: Secondary | ICD-10-CM

## 2013-11-15 DIAGNOSIS — K219 Gastro-esophageal reflux disease without esophagitis: Secondary | ICD-10-CM

## 2013-11-15 DIAGNOSIS — R209 Unspecified disturbances of skin sensation: Secondary | ICD-10-CM

## 2013-11-15 DIAGNOSIS — F32A Depression, unspecified: Secondary | ICD-10-CM

## 2013-11-15 DIAGNOSIS — R5383 Other fatigue: Secondary | ICD-10-CM

## 2013-11-15 DIAGNOSIS — R5381 Other malaise: Secondary | ICD-10-CM

## 2013-11-15 NOTE — Progress Notes (Signed)
Pre-visit discussion using our clinic review tool. No additional management support is needed unless otherwise documented below in the visit note.  

## 2013-11-19 ENCOUNTER — Encounter: Payer: Self-pay | Admitting: Internal Medicine

## 2013-11-19 NOTE — Assessment & Plan Note (Signed)
Now describes the hot sensation that extends from her lower legs up her body.  Feels similar to her previous hot flashes.  Recent labs including tsh and cbc wnl.  No fever.  No change with recent abx use.  No neuropathic changes.  Reports worsening.  Will have endocrinology evaluate.  Pt agreeable.

## 2013-11-19 NOTE — Assessment & Plan Note (Signed)
Reflux controlled

## 2013-11-19 NOTE — Assessment & Plan Note (Signed)
Found to have a "vague nodule" on cxr.  Has CT chest scheduled for today.

## 2013-11-19 NOTE — Assessment & Plan Note (Signed)
Not reported as an issue today.  No pain.  Follow.

## 2013-11-19 NOTE — Assessment & Plan Note (Signed)
Does feel better on the zoloft.  Follow.  Continue current dose.

## 2013-11-19 NOTE — Assessment & Plan Note (Signed)
On zoloft.  Doing better.  Continue regular exercise routine.

## 2013-11-19 NOTE — Progress Notes (Signed)
Subjective:    Patient ID: Bridget Briggs, female    DOB: 1930/06/01, 80 y.o.   MRN: [id number]  HPI 80 year old female with past history of GERD, colonic polyps, glaucoma and depression.  She comes in today for a scheduled follow up.  Sees Dr Tiffany Kocher.  Reflux is controlled.  She has some arthritis.  Last visit she had reported not feeling well.  Increased fatigue.  See last note for details.  Was started on zoloft.  Feels better.  Reports no cardiac symptoms with increased activity or exertion.   No chest pain now.  Breathing stable.  Does report noticing a hot sensation that starts in her lower legs and extends up her body.  No redness of her legs.  No swelling.  States it feels similar to her hot flashes that she used to experience.  Sees Dr Gloriann Loan for her glaucoma.  No bowel change or urine change.  Went to [facility] several weeks ago for evaluation.  Described a hot sensation in her legs.  States do not feel hot to touch, but to her they feel hot.  [facility] evaluated and instructed her to stop Evista.   Sleeping ok.  Eating ok.  Overall does feel better.      Past Medical History  Diagnosis Date  . GERD (gastroesophageal reflux disease)   . Arthritis   . Depression   . History of chicken pox   . Glaucoma   . Hx: UTI (urinary tract infection)   . History of colon polyps     Outpatient Encounter Prescriptions as of 11/15/2013  Medication Sig  . beta carotene w/minerals (OCUVITE) tablet Take 1 tablet by mouth daily.  . calcium-vitamin D (OSCAL WITH D) 500-200 MG-UNIT per tablet Take 1 tablet by mouth daily.  . diphenhydramine-acetaminophen (TYLENOL PM) 25-500 MG TABS Take 1 tablet by mouth at bedtime as needed.  . dorzolamide-timolol (COSOPT) 22.3-6.8 MG/ML ophthalmic solution Place 1 drop into both eyes 2 (two) times daily.  . fluocinolone (SYNALAR) 0.01 % external solution Apply topically 2 (two) times daily as needed.  . latanoprost (XALATAN) 0.005 % ophthalmic solution Place 1 drop into  both eyes at bedtime.  . sertraline (ZOLOFT) 50 MG tablet Take 1 tablet (50 mg total) by mouth daily.  . vitamin D, CHOLECALCIFEROL, 400 UNITS tablet Take 400 Units by mouth daily.  Marland Kitchen amoxicillin-clavulanate (AUGMENTIN) 875-125 MG per tablet Take 1 tablet by mouth 2 (two) times daily.  . fluticasone (FLONASE) 50 MCG/ACT nasal spray Place 2 sprays into both nostrils daily.  . ondansetron (ZOFRAN) 4 MG tablet Take 1 tablet (4 mg total) by mouth every 8 (eight) hours as needed for nausea or vomiting.  . promethazine (PHENERGAN) 12.5 MG tablet Take 1 tablet (12.5 mg total) by mouth every 8 (eight) hours as needed for nausea or vomiting.    Review of Systems Patient denies any headache, lightheadedness or dizziness.  No sinus or allergy symptoms.   No palpitations.  Denies any chest pain.  No increased shortness of breath.  No nausea or vomiting.  Acid reflux controlled.  No abdominal pain or cramping.  No bowel change, such as diarrhea, constipation, BRBPR or melana.  No urine change.   Increased fatigue.  Hot sensation involving her legs and extending up her body.   No rash.  Previously smoked.  Quit smoking 24 years ago.     Objective:   Physical Exam  Filed Vitals:   11/15/13 1124  BP: 126/60  Pulse: 62  Temp: 97.9 F (36.6 C)  Resp: 16   Blood pressure recheck:  128/78, pulse 38  80 year old female in no acute distress.   HEENT:  Nares- clear.  Oropharynx - without lesions. NECK:  Supple.  Nontender.  No audible bruit.  HEART:  Appears to be regular. LUNGS:  No crackles or wheezing audible.  Respirations even and unlabored.  RADIAL PULSE:  Equal bilaterally.   ABDOMEN:  Soft, nontender.  Bowel sounds present and normal.  No audible abdominal bruit.  EXTREMITIES:  No increased edema present.   SKIN:  No increased erythema or warmth.            Assessment & Plan:  HEALTH MAINTENANCE.  Physical 04/29/13.  Mammogram 06/22/13 - Birads I.    I spent 25 minutes with the patient and  more than 50% of the time was spent in consultation regarding the above (specifically plan for further treatment of her warm sensation and depression).

## 2013-12-02 ENCOUNTER — Ambulatory Visit: Payer: Medicare Other | Admitting: Endocrinology

## 2013-12-05 ENCOUNTER — Telehealth: Payer: Self-pay | Admitting: Internal Medicine

## 2013-12-05 DIAGNOSIS — R9389 Abnormal findings on diagnostic imaging of other specified body structures: Secondary | ICD-10-CM

## 2013-12-05 NOTE — Telephone Encounter (Signed)
Pt notified of CT chest.  Needs appt with pulmonary.  Order placed for referral.  Thanks.

## 2013-12-07 ENCOUNTER — Ambulatory Visit (INDEPENDENT_AMBULATORY_CARE_PROVIDER_SITE_OTHER): Payer: Medicare Other | Admitting: Endocrinology

## 2013-12-07 ENCOUNTER — Encounter: Payer: Self-pay | Admitting: Endocrinology

## 2013-12-07 VITALS — BP 138/68 | HR 64 | Temp 98.1°F | Resp 16 | Ht 64.25 in | Wt 174.5 lb

## 2013-12-07 DIAGNOSIS — Z13820 Encounter for screening for osteoporosis: Secondary | ICD-10-CM

## 2013-12-07 DIAGNOSIS — R209 Unspecified disturbances of skin sensation: Secondary | ICD-10-CM

## 2013-12-07 DIAGNOSIS — R208 Other disturbances of skin sensation: Secondary | ICD-10-CM

## 2013-12-07 LAB — VITAMIN B12: VITAMIN B 12: 230 pg/mL (ref 211–911)

## 2013-12-07 LAB — VITAMIN D 25 HYDROXY (VIT D DEFICIENCY, FRACTURES): VITD: 30.19 ng/mL

## 2013-12-07 NOTE — Progress Notes (Signed)
Patient ID: Bridget Briggs, female   DOB: 12-28-1930, 80 y.o.   MRN: [id number]   Reason for visit: Burning sensation in feet and sensation of feeling hot.   HPI:  Bridget Briggs is a 80 y.o. female who is referred here by Dr. Alisa Graff, MD for evaluation of burning sensation in feet and sensation of feeling hot. Patient reports that she recently noticing this symptom for the past several months and was evaluated by [facility] and was asked to stop Evista. She was put on this medication 5+ years ago for " bone building". She was tolerating it well till recently.  She reports that around this time, she was also diagnosed with depression and started on Zoloft by her PCP. She thinks that her " sensation of feeling hot" is probably slightly worse now that she has been off Evista. It is manageable and now seems to be happening mainly at night time, but lasts all day too. Now this sensation is all over her body, not just till her calves as before. Sensation arises in her feet.  The patient takes calcium and Vitamin D daily. She recalls her last DXA was over 2+ years ago. She denies any history of fragility fractures. She does have a history of left wrist traumatic fracture in the 1980s after falling down from her attic.  There have been no recent change in medications other than those mentioned above.  There are no factors that make the symptoms better or worse per her report.  Denies alcohol use. Denies history of diabetes. Denies OTC niacin use. Denies prior thyroid history. Not a known HTN> denies episodic fluctuations in BP, HAs, flushing. Denies rashes, wheezing, diarrrhea. Menopause was over 30 years ago and not surgically induced. Denies altered sensation in her feet.  Past Medical History  Diagnosis Date  . GERD (gastroesophageal reflux disease)   . Arthritis   . Depression   . History of chicken pox   . Glaucoma   . Hx: UTI (urinary tract infection)   . History of colon polyps    Past  Surgical History  Procedure Laterality Date  . Cholecystectomy  1996  . Appendectomy  1974  . Tonsillectomy and adenoidectomy  1940  . Breast surgery Right 1988    biopsy  . Abdominal hysterectomy  1974    left ovary removed  . Ankle surgery Left 1998  . Nissen fundoplication  4098  . Eye surgery Bilateral 2005/2006    cataract  . Laparoscopic gastric banding with hiatal hernia repair  2000   History   Social History  . Marital Status: Widowed    Spouse Name: N/A    Number of Children: N/A  . Years of Education: N/A   Occupational History  . Not on file.   Social History Main Topics  . Smoking status: Former Research scientist (life sciences)  . Smokeless tobacco: Never Used  . Alcohol Use: No  . Drug Use: No  . Sexual Activity: Not on file   Other Topics Concern  . Not on file   Social History Narrative  . No narrative on file   Current Outpatient Prescriptions on File Prior to Visit  Medication Sig Dispense Refill  . beta carotene w/minerals (OCUVITE) tablet Take 1 tablet by mouth daily.      . calcium-vitamin D (OSCAL WITH D) 500-200 MG-UNIT per tablet Take 1 tablet by mouth daily.      . diphenhydramine-acetaminophen (TYLENOL PM) 25-500 MG TABS Take 1 tablet by mouth at bedtime  as needed.      . dorzolamide-timolol (COSOPT) 22.3-6.8 MG/ML ophthalmic solution Place 1 drop into both eyes 2 (two) times daily.      Marland Kitchen latanoprost (XALATAN) 0.005 % ophthalmic solution Place 1 drop into both eyes at bedtime.      . sertraline (ZOLOFT) 50 MG tablet Take 1 tablet (50 mg total) by mouth daily.  30 tablet  1  . vitamin D, CHOLECALCIFEROL, 400 UNITS tablet Take 400 Units by mouth daily.      Marland Kitchen amoxicillin-clavulanate (AUGMENTIN) 875-125 MG per tablet Take 1 tablet by mouth 2 (two) times daily.  14 tablet  0  . fluocinolone (SYNALAR) 0.01 % external solution Apply topically 2 (two) times daily as needed.      . fluticasone (FLONASE) 50 MCG/ACT nasal spray Place 2 sprays into both nostrils daily.  16 g   6  . ondansetron (ZOFRAN) 4 MG tablet Take 1 tablet (4 mg total) by mouth every 8 (eight) hours as needed for nausea or vomiting.  20 tablet  0  . promethazine (PHENERGAN) 12.5 MG tablet Take 1 tablet (12.5 mg total) by mouth every 8 (eight) hours as needed for nausea or vomiting.  20 tablet  0   No current facility-administered medications on file prior to visit.   No Known Allergies Family History  Problem Relation Age of Onset  . Arthritis Mother   . Leukemia Mother   . Lung cancer Father     Review of Systems: General: Denies weight change, loss of appetite. Reports fatigue.  ENT: Denies hearing difficulty, or difficulty swallowing. Eyes: Denies Vision difficulty or eye pain.  Resp: Denies Frequent cough, SOB, wheezing. CVS: Denies palpitations, SOB when flat or leg swelling. Reports minor chest pain at times.  GI: Denies heartburn, N/V, diarrhea, constipation or abdominal pain. GU: Denies polyuria or nocturia Skin/hair/nails: Denies rash, abnormal stretch marks, itching or hair loss, abnormal hair growth Bone/joints: Denies muscle aches or bone pain. Reports joint pain.  Reproductive: Denies low libido, breast discharge CNS: Denies frequent headaches, blurry vision, tremors, seizures, loss of consciousness, localized weakness Endocrine: Denies polydipsia, cold intolerance. Reports heat intolerance Allergies: Denies food or environmental allergies.  Heme: Denies easy bruising or enlarged glands/lumps in neck.   Filed Vitals:   12/07/13 1003  BP: 138/68  Pulse: 64  Temp: 98.1 F (36.7 C)  Resp: 16   Physical Exam:  HEENT: [place]/AT, EOMI, no icterus, no proptosis, no chemosis, no mild lid lag, no retraction, eyes close completely Neck: thyroid gland - smooth, non-tender, no erythema, no tracheal deviation; negative Pemberton's sign; no lympahadenopathy; no bruits Lungs: good air entry, clear bilaterally Heart: S1&S2 normal, regular rate & rhythm; no murmurs, rubs or  gallops Abd: soft, NT, ND, no HSM, +BS Ext: mild tremor in hands bilaterally, no edema, 2+ DP/PT pulses, good muscle mass Neuro: normal gait, 2+ reflexes bilaterally, normal 5/5 strength, no proximal myopathy, normal MF testing with shoes and socks removed.  Derm: no pretibial myxoedema/skin dryness    Chemistry      Component Value Date/Time   NA 137 10/28/2013 1555   K 4.6 10/28/2013 1555   CL 103 10/28/2013 1555   CO2 28 10/28/2013 1555   BUN 12 10/28/2013 1555   CREATININE 0.8 10/28/2013 1555      Component Value Date/Time   CALCIUM 9.4 10/28/2013 1555   ALKPHOS 62 10/28/2013 1555   AST 25 10/28/2013 1555   ALT 23 10/28/2013 1555   BILITOT 0.5 10/28/2013 1555  Lab Results  Component Value Date   TSH 3.10 10/28/2013    Assessment and Plan:  1. Burning sensation over extremities 2. ? Hot flashes 3. Screening for bone strength/osteoporosis  Plan:  This is a pleasant 80 year female who has vague paresthesias and sensation of feeling heat throughout the body. Etiologies could be broad including medication related, Vitamin B12 deficiency or neuropathy and other rare etiologies like carcinoid and pheochromocytoma ( low probablity given lack of associated symptoms and not a known HTN patient).  Etiology is not clear to me at this time.  The patient has been on SERM ( Evista) for a long time. This medication could be associated with worsening hot flashes. Evista is a weaker option for bone protection than the alternatives, and given her age and cardiovascular risk and risk of blood clots with this medication, I agree that she should stay off this medication. It is possible that she is getting adjusted to coming off this medication.  In the meantime, will assess for Vitamin B12 deficiency given her paresthesias.  Will check Vitamin D levels given her fatigue and joint pains.  She will follow back with her PCP for follow up DXA and management of her bone strength after this test. Continue  current calcium and Vitamin D for now.  If the patient continues to be symptomatic after repletion of any Vitamin deficiencies, then could consider a neurology evaluation for possible neuropathy. Alternatively, assuming the symptomatology is hot flashes, consider switching Zoloft to other SSRI like citalopram or paroxetine or SNRI like venlafaxine, which could be more effective with this symptom.   Bridget Briggs [facility] 12/07/2013  2:17 PM

## 2013-12-07 NOTE — Patient Instructions (Signed)
Sensation of feeling hot all over the body-  Unclear etiology. Could be medication related as seems to be slightly worse after coming Evista. She seems to be adjusting to coming off the medication. Could substitute Zoloft for another agent to see if this symptom improves- check with PCP.  Keep watch over symptoms.  Check B12 and Vitamin D levels today. Replete if low.  Follow up with PCP for osteoporosis screening and getting follow up DXA/management for bones.   Return to clinic as needed.

## 2013-12-07 NOTE — Progress Notes (Signed)
Pre-visit discussion using our clinic review tool. No additional management support is needed unless otherwise documented below in the visit note.  

## 2013-12-08 ENCOUNTER — Encounter: Payer: Self-pay | Admitting: Internal Medicine

## 2013-12-08 ENCOUNTER — Encounter: Payer: Self-pay | Admitting: *Deleted

## 2013-12-08 NOTE — Progress Notes (Signed)
Quick Note:  Wells Guiles-  Please could you call the patient with the results.   Vitamin D and B12 levels are normal at this time. Continue current supplementation.   ______

## 2013-12-27 ENCOUNTER — Other Ambulatory Visit: Payer: Self-pay | Admitting: Internal Medicine

## 2014-01-03 ENCOUNTER — Institutional Professional Consult (permissible substitution): Payer: Medicare Other | Admitting: Pulmonary Disease

## 2014-01-14 ENCOUNTER — Encounter: Payer: Self-pay | Admitting: Internal Medicine

## 2014-01-20 ENCOUNTER — Ambulatory Visit (INDEPENDENT_AMBULATORY_CARE_PROVIDER_SITE_OTHER): Payer: Medicare Other | Admitting: Pulmonary Disease

## 2014-01-20 ENCOUNTER — Encounter: Payer: Self-pay | Admitting: Pulmonary Disease

## 2014-01-20 VITALS — BP 132/68 | HR 60 | Ht 64.0 in | Wt 168.0 lb

## 2014-01-20 DIAGNOSIS — J479 Bronchiectasis, uncomplicated: Secondary | ICD-10-CM

## 2014-01-20 DIAGNOSIS — R911 Solitary pulmonary nodule: Secondary | ICD-10-CM

## 2014-01-20 DIAGNOSIS — Z23 Encounter for immunization: Secondary | ICD-10-CM

## 2014-01-20 DIAGNOSIS — R918 Other nonspecific abnormal finding of lung field: Secondary | ICD-10-CM

## 2014-01-20 NOTE — Progress Notes (Signed)
Subjective:    Patient ID: Bridget Briggs, female    DOB: 1930/07/24, 80 y.o.   MRN: [id number]  HPI  Bridget Briggs is here to see me because she recntly had a Chest X-ray that had some scarring and this led to a CT that showed more scarring.  She has never had any respiratory problems in the past other than one episode of pneumonia.  This occurred in 15 to 20 years ago and she was never hospitalized for it.  She was told that she had walking pneumonia and thinks that she was treated with prednisone.  Recently she had some pain in her chest and so she was sent for the chest x0ray. She still occasionally has a rare chest pain which does happen with exercise.  It typically occurs while she is at rest.  Sometimes she will have this feeling of being hot in her legs and then it moves up into her chest and then leads to squeezing in her chest.  She says it is mild and occurs when lying down.  This is not associated with cough or dyspnea.  She says it has been going on for 6 months or more and is not getting.  She exercises regularly at the [facility] and walking at home and she has not slowed down lately.  This includes at least 20 minutes of aerobic exercise.  Past Medical History  Diagnosis Date  . GERD (gastroesophageal reflux disease)   . Arthritis   . Depression   . History of chicken pox   . Glaucoma   . Hx: UTI (urinary tract infection)   . History of colon polyps      Family History  Problem Relation Age of Onset  . Arthritis Mother   . Leukemia Mother   . Lung cancer Father   . Osteoporosis Other      History   Social History  . Marital Status: Widowed    Spouse Name: N/A    Number of Children: N/A  . Years of Education: N/A   Occupational History  . Not on file.   Social History Main Topics  . Smoking status: Former Smoker -- 1.00 packs/day for 30 years    Types: Cigarettes    Quit date: 05/20/1988  . Smokeless tobacco: Never Used  . Alcohol Use: No  . Drug Use: No  . Sexual  Activity: Not on file   Other Topics Concern  . Not on file   Social History Narrative  . No narrative on file     No Known Allergies   Outpatient Prescriptions Prior to Visit  Medication Sig Dispense Refill  . amoxicillin-clavulanate (AUGMENTIN) 875-125 MG per tablet Take 1 tablet by mouth 2 (two) times daily.  14 tablet  0  . beta carotene w/minerals (OCUVITE) tablet Take 1 tablet by mouth daily.      . calcium-vitamin D (OSCAL WITH D) 500-200 MG-UNIT per tablet Take 1 tablet by mouth daily.      . diphenhydramine-acetaminophen (TYLENOL PM) 25-500 MG TABS Take 1 tablet by mouth at bedtime as needed.      . dorzolamide-timolol (COSOPT) 22.3-6.8 MG/ML ophthalmic solution Place 1 drop into both eyes 2 (two) times daily.      . fluocinolone (SYNALAR) 0.01 % external solution Apply topically 2 (two) times daily as needed.      . latanoprost (XALATAN) 0.005 % ophthalmic solution Place 1 drop into both eyes at bedtime.      . sertraline (ZOLOFT) 50  MG tablet TAKE ONE (1) TABLET EACH DAY  30 tablet  1  . vitamin D, CHOLECALCIFEROL, 400 UNITS tablet Take 400 Units by mouth daily.      . fluticasone (FLONASE) 50 MCG/ACT nasal spray Place 2 sprays into both nostrils daily.  16 g  6  . ondansetron (ZOFRAN) 4 MG tablet Take 1 tablet (4 mg total) by mouth every 8 (eight) hours as needed for nausea or vomiting.  20 tablet  0  . promethazine (PHENERGAN) 12.5 MG tablet Take 1 tablet (12.5 mg total) by mouth every 8 (eight) hours as needed for nausea or vomiting.  20 tablet  0   No facility-administered medications prior to visit.     Review of Systems  Constitutional: Negative for fever and unexpected weight change.  HENT: Positive for rhinorrhea. Negative for congestion, dental problem, ear pain, nosebleeds, postnasal drip, sinus pressure, sneezing, sore throat and trouble swallowing.   Eyes: Negative for redness and itching.  Respiratory: Positive for chest tightness. Negative for cough,  shortness of breath and wheezing.   Cardiovascular: Negative for palpitations and leg swelling.  Gastrointestinal: Negative for nausea and vomiting.  Genitourinary: Negative for dysuria.  Musculoskeletal: Negative for joint swelling.  Skin: Negative for rash.  Neurological: Negative for headaches.  Hematological: Does not bruise/bleed easily.  Psychiatric/Behavioral: Negative for dysphoric mood. The patient is not nervous/anxious.        Objective:   Physical Exam Filed Vitals:   01/20/14 1107  BP: 132/68  Pulse: 60  Height: 5\' 4"  (1.626 m)  Weight: 168 lb (76.204 kg)  SpO2: 96%  RA  Gen: well appearing, no acute distress HEENT: NCAT, PERRL, EOMi, OP clear, neck supple without masses PULM: CTA B CV: RRR, slight systolic murmur, no JVD AB: BS+, soft, nontender, no hsm Ext: warm, no edema, no clubbing, no cyanosis Derm: no rash or skin breakdown Neuro: A&Ox4, CN II-XII intact, strength 5/5 in all 4 extremities   11/15/2013 CT chest> mild centrilobular emphysema bilaterally, there is mild lingular bronchiectasis as well as right middle lobe bronchiectasis., There is also subpleural nodularity in the right lower lobe which is worrisome for Mycobacterium avium complex infection.     Assessment & Plan:   Bronchiectasis without acute exacerbation The CT scan of her chest from this year shows evidence of lingular and right middle lobe bronchiectasis. I believe that this is do to the severe respiratory infection she had 15-20 years ago. She says she was treated with antibiotics and prednisone at that time. She has noted it least one and a lengthy episode of bronchitis since then. However, I do not believe that this is an explanation for the very strange chest pain sensation she has been experiencing.  I explained to her at length today that the most important thing she needs to do is to get a flu shot every year, keep her pneumonia shot up-to-date, and to stay active.  Currently she  has very minimal symptoms from her bronchiectasis I do not recommend inhaler therapy.  If and when she gets an episode of bronchitis she should have a sputum culture sent and then she should be treated with Cipro 750 mg twice a day for 10 days.  Plan: -Followup with me in 6 months - Flu shot today, Prevnar vaccine today  Multiple pulmonary nodules She had multiple pulmonary nodules. The radiologist raised the possibility of Mycobacterium avium infection. She does not have clinical signs or symptoms of this right now. Therefore I see no  need to proceed with a further workup right now. However, I think that she should have a repeat CT scan of her chest in 6 months to ensure that these nodules are not growing.  Plan: -Repeat CT chest in 6 months and then followup with me afterwards.   Updated Medication List Outpatient Encounter Prescriptions as of 01/20/2014  Medication Sig  . amoxicillin-clavulanate (AUGMENTIN) 875-125 MG per tablet Take 1 tablet by mouth 2 (two) times daily.  . beta carotene w/minerals (OCUVITE) tablet Take 1 tablet by mouth daily.  . calcium-vitamin D (OSCAL WITH D) 500-200 MG-UNIT per tablet Take 1 tablet by mouth daily.  . diphenhydramine-acetaminophen (TYLENOL PM) 25-500 MG TABS Take 1 tablet by mouth at bedtime as needed.  . dorzolamide-timolol (COSOPT) 22.3-6.8 MG/ML ophthalmic solution Place 1 drop into both eyes 2 (two) times daily.  . fluocinolone (SYNALAR) 0.01 % external solution Apply topically 2 (two) times daily as needed.  . latanoprost (XALATAN) 0.005 % ophthalmic solution Place 1 drop into both eyes at bedtime.  . sertraline (ZOLOFT) 50 MG tablet TAKE ONE (1) TABLET EACH DAY  . vitamin D, CHOLECALCIFEROL, 400 UNITS tablet Take 400 Units by mouth daily.  . [DISCONTINUED] fluticasone (FLONASE) 50 MCG/ACT nasal spray Place 2 sprays into both nostrils daily.  . [DISCONTINUED] ondansetron (ZOFRAN) 4 MG tablet Take 1 tablet (4 mg total) by mouth every 8 (eight)  hours as needed for nausea or vomiting.  . [DISCONTINUED] promethazine (PHENERGAN) 12.5 MG tablet Take 1 tablet (12.5 mg total) by mouth every 8 (eight) hours as needed for nausea or vomiting.

## 2014-01-20 NOTE — Patient Instructions (Signed)
We will order another CT chest in 6 months at [facility] for your pulmonary nodule We will see you back in 6 months after the CT

## 2014-01-20 NOTE — Assessment & Plan Note (Signed)
She had multiple pulmonary nodules. The radiologist raised the possibility of Mycobacterium avium infection. She does not have clinical signs or symptoms of this right now. Therefore I see no need to proceed with a further workup right now. However, I think that she should have a repeat CT scan of her chest in 6 months to ensure that these nodules are not growing.  Plan: -Repeat CT chest in 6 months and then followup with me afterwards.

## 2014-01-20 NOTE — Assessment & Plan Note (Addendum)
The CT scan of her chest from this year shows evidence of lingular and right middle lobe bronchiectasis. I believe that this is do to the severe respiratory infection she had 15-20 years ago. She says she was treated with antibiotics and prednisone at that time. She has noted it least one and a lengthy episode of bronchitis since then. However, I do not believe that this is an explanation for the very strange chest pain sensation she has been experiencing.  I explained to her at length today that the most important thing she needs to do is to get a flu shot every year, keep her pneumonia shot up-to-date, and to stay active.  Currently she has very minimal symptoms from her bronchiectasis I do not recommend inhaler therapy.  If and when she gets an episode of bronchitis she should have a sputum culture sent and then she should be treated with Cipro 750 mg twice a day for 10 days.  Plan: -Followup with me in 6 months - Flu shot today, Prevnar vaccine today

## 2014-01-25 ENCOUNTER — Encounter: Payer: Self-pay | Admitting: Internal Medicine

## 2014-01-25 ENCOUNTER — Ambulatory Visit (INDEPENDENT_AMBULATORY_CARE_PROVIDER_SITE_OTHER): Payer: Medicare Other | Admitting: Internal Medicine

## 2014-01-25 VITALS — BP 124/60 | HR 62 | Temp 98.0°F | Resp 14 | Ht 64.0 in | Wt 174.5 lb

## 2014-01-25 DIAGNOSIS — F3289 Other specified depressive episodes: Secondary | ICD-10-CM

## 2014-01-25 DIAGNOSIS — R209 Unspecified disturbances of skin sensation: Secondary | ICD-10-CM

## 2014-01-25 DIAGNOSIS — K219 Gastro-esophageal reflux disease without esophagitis: Secondary | ICD-10-CM

## 2014-01-25 DIAGNOSIS — R5383 Other fatigue: Secondary | ICD-10-CM

## 2014-01-25 DIAGNOSIS — I6529 Occlusion and stenosis of unspecified carotid artery: Secondary | ICD-10-CM

## 2014-01-25 DIAGNOSIS — R5381 Other malaise: Secondary | ICD-10-CM

## 2014-01-25 DIAGNOSIS — R9389 Abnormal findings on diagnostic imaging of other specified body structures: Secondary | ICD-10-CM

## 2014-01-25 DIAGNOSIS — R208 Other disturbances of skin sensation: Secondary | ICD-10-CM

## 2014-01-25 DIAGNOSIS — F329 Major depressive disorder, single episode, unspecified: Secondary | ICD-10-CM

## 2014-01-25 DIAGNOSIS — H409 Unspecified glaucoma: Secondary | ICD-10-CM

## 2014-01-25 DIAGNOSIS — Z86010 Personal history of colonic polyps: Secondary | ICD-10-CM

## 2014-01-25 DIAGNOSIS — F32A Depression, unspecified: Secondary | ICD-10-CM

## 2014-01-25 NOTE — Progress Notes (Signed)
Subjective:    Patient ID: Bridget Briggs, female    DOB: [birth year], 80 y.o.   MRN: [id number]  HPI 80 year old female with past history of GERD, colonic polyps, glaucoma and depression.  She comes in today for a scheduled follow up.  Sees Dr Tiffany Kocher.  Reflux is controlled.  She has some arthritis.  Previous recent visits she had reported not feeling well.  Increased fatigue.  See previous notes for details.  Was started on zoloft.  Feels better. Energy better. Reports no cardiac symptoms with increased activity or exertion.   No chest pain.  Breathing stable.  Exercising.  prevoiusly reported noticing a hot sensation that starts in her lower legs and extends up her body.  No redness of her legs. No swelling.  States it feels similar to her hot flashes that she used to experience.  Saw endocrinology.  No clear etiology.  We discussed further evaluation.  She wants to monitor for now.  Overall she feels she is doing well.  Sees Dr Gloriann Loan for her glaucoma.  No bowel change or urine change.        Past Medical History  Diagnosis Date  . GERD (gastroesophageal reflux disease)   . Arthritis   . Depression   . History of chicken pox   . Glaucoma   . Hx: UTI (urinary tract infection)   . History of colon polyps     Outpatient Encounter Prescriptions as of 01/25/2014  Medication Sig  . beta carotene w/minerals (OCUVITE) tablet Take 1 tablet by mouth daily.  . calcium-vitamin D (OSCAL WITH D) 500-200 MG-UNIT per tablet Take 1 tablet by mouth daily.  . diphenhydramine-acetaminophen (TYLENOL PM) 25-500 MG TABS Take 1 tablet by mouth at bedtime as needed.  . dorzolamide-timolol (COSOPT) 22.3-6.8 MG/ML ophthalmic solution Place 1 drop into both eyes 2 (two) times daily.  . fluocinolone (SYNALAR) 0.01 % external solution Apply topically 2 (two) times daily as needed.  . latanoprost (XALATAN) 0.005 % ophthalmic solution Place 1 drop into both eyes at bedtime.  . sertraline (ZOLOFT) 50 MG tablet TAKE ONE (1)  TABLET EACH DAY  . vitamin D, CHOLECALCIFEROL, 400 UNITS tablet Take 400 Units by mouth daily.  . [DISCONTINUED] amoxicillin-clavulanate (AUGMENTIN) 875-125 MG per tablet Take 1 tablet by mouth 2 (two) times daily.    Review of Systems Patient denies any headache, lightheadedness or dizziness.  No sinus or allergy symptoms.   No palpitations.  Denies any chest pain.  No increased shortness of breath.  No nausea or vomiting.  Acid reflux controlled.  No abdominal pain or cramping.  No bowel change, such as diarrhea, constipation, BRBPR or melana.  No urine change.   Fatigue better.  Is exercising regularly.    Hot sensation involving her legs and extending up her body.  No rash.      Objective:   Physical Exam  Filed Vitals:   01/25/14 1130  BP: 124/60  Pulse: 62  Temp: 98 F (36.7 C)  Resp: 14   Pulse recheck:  5  80 year old female in no acute distress.   HEENT:  Nares- clear.  Oropharynx - without lesions. NECK:  Supple.  Nontender.  No audible bruit.  HEART:  Appears to be regular. LUNGS:  No crackles or wheezing audible.  Respirations even and unlabored.  RADIAL PULSE:  Equal bilaterally.   ABDOMEN:  Soft, nontender.  Bowel sounds present and normal.  No audible abdominal bruit.  EXTREMITIES:  No increased  edema present.   SKIN:  No increased erythema or warmth.            Assessment & Plan:  HEALTH MAINTENANCE.  Physical 04/29/13.  Mammogram 06/22/13 - Birads I.

## 2014-01-25 NOTE — Progress Notes (Signed)
Pre visit review using our clinic review tool, if applicable. No additional management support is needed unless otherwise documented below in the visit note. 

## 2014-01-27 ENCOUNTER — Encounter: Payer: Self-pay | Admitting: Internal Medicine

## 2014-01-27 NOTE — Assessment & Plan Note (Signed)
Describes the hot sensation that extends from her lower legs up her body.  Feels similar to her previous hot flashes.  Recent labs including tsh and cbc wnl.  No fever.  No change with recent abx use.  No neuropathic changes.  Reports worsening.  Endocrinology evaluated.  No clear etiology.  Discussed with her regarding further evaluation and w/up.  She wants to monitor for now.  Follow.

## 2014-01-27 NOTE — Assessment & Plan Note (Signed)
Previously noted to have some carotid stenosis.  Follow up carotid ultrasound 02/06/13 - revealed no hemodynamically significant stenosis.  (left internal 1-49%).

## 2014-01-27 NOTE — Assessment & Plan Note (Signed)
Last colonoscopy 2011.  One polyp removed.  Followed by Dr Elliot.   

## 2014-01-27 NOTE — Assessment & Plan Note (Signed)
Does feel better on the zoloft.  Follow.  Continue current dose.

## 2014-01-27 NOTE — Assessment & Plan Note (Signed)
Followed by Dr Gloriann Loan.

## 2014-01-27 NOTE — Assessment & Plan Note (Signed)
Better  

## 2014-01-27 NOTE — Assessment & Plan Note (Signed)
Reflux controlled

## 2014-01-27 NOTE — Assessment & Plan Note (Signed)
Found to have a "vague nodule" on cxr.  Had CT scan and subsequent f/u with pulmonary.  See his note for details.  Plan for chest CT in 6 months.

## 2014-04-28 ENCOUNTER — Ambulatory Visit (INDEPENDENT_AMBULATORY_CARE_PROVIDER_SITE_OTHER): Payer: Medicare Other | Admitting: Internal Medicine

## 2014-04-28 ENCOUNTER — Encounter: Payer: Self-pay | Admitting: Internal Medicine

## 2014-04-28 ENCOUNTER — Encounter (INDEPENDENT_AMBULATORY_CARE_PROVIDER_SITE_OTHER): Payer: Self-pay

## 2014-04-28 VITALS — BP 118/70 | HR 61 | Temp 98.1°F | Ht 64.0 in | Wt 168.0 lb

## 2014-04-28 DIAGNOSIS — F32A Depression, unspecified: Secondary | ICD-10-CM

## 2014-04-28 DIAGNOSIS — E2839 Other primary ovarian failure: Secondary | ICD-10-CM

## 2014-04-28 DIAGNOSIS — K219 Gastro-esophageal reflux disease without esophagitis: Secondary | ICD-10-CM

## 2014-04-28 DIAGNOSIS — Z860100 Personal history of colon polyps, unspecified: Secondary | ICD-10-CM

## 2014-04-28 DIAGNOSIS — R938 Abnormal findings on diagnostic imaging of other specified body structures: Secondary | ICD-10-CM

## 2014-04-28 DIAGNOSIS — Z1239 Encounter for other screening for malignant neoplasm of breast: Secondary | ICD-10-CM

## 2014-04-28 DIAGNOSIS — F329 Major depressive disorder, single episode, unspecified: Secondary | ICD-10-CM

## 2014-04-28 DIAGNOSIS — R9389 Abnormal findings on diagnostic imaging of other specified body structures: Secondary | ICD-10-CM

## 2014-04-28 DIAGNOSIS — M1990 Unspecified osteoarthritis, unspecified site: Secondary | ICD-10-CM

## 2014-04-28 NOTE — Progress Notes (Signed)
Pre visit review using our clinic review tool, if applicable. No additional management support is needed unless otherwise documented below in the visit note. 

## 2014-05-08 ENCOUNTER — Encounter: Payer: Self-pay | Admitting: Internal Medicine

## 2014-05-08 ENCOUNTER — Telehealth: Payer: Self-pay | Admitting: Internal Medicine

## 2014-05-08 DIAGNOSIS — R918 Other nonspecific abnormal finding of lung field: Secondary | ICD-10-CM

## 2014-05-08 NOTE — Telephone Encounter (Signed)
Per Dr Anastasia Pall last note, pt was supposed to be scheduled a CT f/u in 6 months and f/u with him in 6 months.  (last CT was 10/2013).  Please contact pulmonary and see if they are going to arrange f/u for her.  Thanks.

## 2014-05-08 NOTE — Progress Notes (Signed)
Subjective:    Patient ID: Bridget Briggs, female    DOB: 1930/10/25, 80 y.o.   MRN: [id number]  HPI 80 year old female with past history of GERD, colonic polyps, glaucoma and depression.  She comes in today to follow up on these issues s well as for a complete physical exam.   Sees Dr Tiffany Kocher.  Reflux is controlled.  She has some arthritis.  Has had some low back pain.  Noticed over the last 2-3 weeks.  No pain radiating down her legs.  Taking tylenol.  Better.  On zoloft.  Feels better.  Energy better.  Overall she feels she is doing well. Sees Dr Gloriann Loan for her glaucoma.  No bowel change or urine change.      Past Medical History  Diagnosis Date  . GERD (gastroesophageal reflux disease)   . Arthritis   . Depression   . History of chicken pox   . Glaucoma   . Hx: UTI (urinary tract infection)   . History of colon polyps     Outpatient Encounter Prescriptions as of 04/28/2014  Medication Sig  . beta carotene w/minerals (OCUVITE) tablet Take 1 tablet by mouth daily.  . calcium-vitamin D (OSCAL WITH D) 500-200 MG-UNIT per tablet Take 1 tablet by mouth daily.  . diphenhydramine-acetaminophen (TYLENOL PM) 25-500 MG TABS Take 1 tablet by mouth at bedtime as needed.  . dorzolamide-timolol (COSOPT) 22.3-6.8 MG/ML ophthalmic solution Place 1 drop into both eyes 2 (two) times daily.  . fluocinolone (SYNALAR) 0.01 % external solution Apply topically 2 (two) times daily as needed.  . latanoprost (XALATAN) 0.005 % ophthalmic solution Place 1 drop into both eyes at bedtime.  . vitamin D, CHOLECALCIFEROL, 400 UNITS tablet Take 400 Units by mouth daily.  . [DISCONTINUED] sertraline (ZOLOFT) 50 MG tablet TAKE ONE (1) TABLET EACH DAY    Review of Systems Patient denies any headache, lightheadedness or dizziness.  No sinus or allergy symptoms.   No palpitations.  Denies any chest pain.  No increased shortness of breath.  No nausea or vomiting.  Acid reflux controlled.  No abdominal pain or cramping.  No  bowel change, such as diarrhea, constipation, BRBPR or melana.  No urine change.   Fatigue better.  Is exercising regularly.   Some previous back pain.       Objective:   Physical Exam  Filed Vitals:   04/28/14 1328  BP: 118/70  Pulse: 61  Temp: 98.1 F (5.62 C)   80 year old female in no acute distress.   HEENT:  Nares- clear.  Oropharynx - without lesions. NECK:  Supple.  Nontender.  No audible bruit.  HEART:  Appears to be regular. LUNGS:  No crackles or wheezing audible.  Respirations even and unlabored.  RADIAL PULSE:  Equal bilaterally.    BREASTS:  No nipple discharge or nipple retraction present.  Could not appreciate any distinct nodules or axillary adenopathy.  ABDOMEN:  Soft, nontender.  Bowel sounds present and normal.  No audible abdominal bruit.  GU:  Not performed.   EXTREMITIES:  No increased edema present.  DP pulses palpable and equal bilaterally.          Assessment & Plan:  1. Breast cancer screening - MM DIGITAL SCREENING BILATERAL; Future  2. Gastroesophageal reflux disease, esophagitis presence not specified Symptoms controlled.    3. Arthritis Stable.  Recent back pain.  Better now.    4. Depression Stable on zoloft.    5. History of colonic polyps  Last colonoscopy 2011.  One polyp removed.  Followed by Dr Tiffany Kocher.   6. Abnormal CXR Previous found to have multiple pulmonary nodules.  Was referred to Dr Lake Bells.  See his note for details.  Recommend f/u CT in 6 months and f/u with him after CT.  Last CT 6/15.  Contact Dr Anastasia Pall office for question of f/u.    7. Estrogen deficiency - DG Bone Density; Future  HEALTH MAINTENANCE.  Physical today.  Mammogram 06/22/13 - Birads I.  Schedule f/u mammogram.  Schedule bone density.  Colonoscopy as outlined.

## 2014-05-09 NOTE — Telephone Encounter (Signed)
lmtcb x1 for pt to schedule rov and make her aware of ct.  Will order ct after speaking to pt.

## 2014-05-09 NOTE — Telephone Encounter (Signed)
Bridget Briggs, Please arrange CT chest without contrast for 1-2 weeks from now and have a f/u appointment afterwards Thanks B

## 2014-05-10 ENCOUNTER — Telehealth: Payer: Self-pay | Admitting: Pulmonary Disease

## 2014-05-10 NOTE — Telephone Encounter (Signed)
Duplicate message. 

## 2014-05-10 NOTE — Telephone Encounter (Signed)
Spoke with pt, scheduled for 06/22/14 in [place], ct ordered.  Nothing further needed.

## 2014-05-11 ENCOUNTER — Encounter: Payer: Self-pay | Admitting: Internal Medicine

## 2014-06-16 ENCOUNTER — Telehealth: Payer: Self-pay | Admitting: Pulmonary Disease

## 2014-06-16 NOTE — Telephone Encounter (Signed)
lmtcb x1 

## 2014-06-16 NOTE — Telephone Encounter (Signed)
Per the 9.3.15 ov w/ BQ: Patient Instructions       We will order another CT chest in 6 months at [facility] for your pulmonary nodule We will see you back in 6 months after the CT   Pt is scheduled for ov w/ BQ on 2.3.16 Per pt's chart, our office scheduled her CT for 3.10.16: Referral Notes     Type Date User   General 05/30/2014 4:18 PM ROGERS, ALIDA L        Note   Ct sched 07/28/14 @ [place] location 2pm, pt is aware. precert pending//Libby .Alison Stalling, CMA     Advised pt will attempt to reschedule her CT to closer to the appt date/time with BQ [facility] and spoke with Santiago Glad >> CT rescheduled to Monday 2.1.16 @ 1pm, arrive at 1245 at the [place] location (original location).  Staff message sent to [facility] to see if this needs to be precert-ed.  Will call patient once everything is completed.  Will hold in triage.

## 2014-06-16 NOTE — Telephone Encounter (Signed)
Pt returned call - [phone]

## 2014-06-17 NOTE — Telephone Encounter (Signed)
LMOMTCB x 1 

## 2014-06-17 NOTE — Telephone Encounter (Signed)
Per staff message sent by [facility]: RE: CT rescheduled for [facility]  Received: [place] Bridget Briggs, CMA            Obtained pre cert and faxed order to [facility]-pt aware  Thanks,  Rhonda     [facility] TCB x1 - would like to verify with patient that she is aware of her moved appt for CT at [facility]. Will route to my inbasket as pt was asked to speak with me personally or triage.

## 2014-06-17 NOTE — Telephone Encounter (Signed)
Pt returned call. CB at [phone]

## 2014-06-20 ENCOUNTER — Ambulatory Visit: Payer: Self-pay | Admitting: Pulmonary Disease

## 2014-06-20 NOTE — Telephone Encounter (Signed)
lmtcb for pt.  

## 2014-06-21 NOTE — Telephone Encounter (Signed)
Pt has already went for her CT scan on 06/20/14. Reminded her of her appointment with Dr. Lake Bells tomorrow in [place]. Nothing further was needed.

## 2014-06-22 ENCOUNTER — Ambulatory Visit (INDEPENDENT_AMBULATORY_CARE_PROVIDER_SITE_OTHER): Payer: Medicare Other | Admitting: Pulmonary Disease

## 2014-06-22 ENCOUNTER — Encounter: Payer: Self-pay | Admitting: Pulmonary Disease

## 2014-06-22 ENCOUNTER — Encounter (INDEPENDENT_AMBULATORY_CARE_PROVIDER_SITE_OTHER): Payer: Self-pay

## 2014-06-22 VITALS — BP 118/66 | HR 70 | Ht 64.0 in | Wt 183.0 lb

## 2014-06-22 DIAGNOSIS — R918 Other nonspecific abnormal finding of lung field: Secondary | ICD-10-CM

## 2014-06-22 DIAGNOSIS — J479 Bronchiectasis, uncomplicated: Secondary | ICD-10-CM

## 2014-06-22 MED ORDER — ALBUTEROL SULFATE HFA 108 (90 BASE) MCG/ACT IN AERS: 2.0000 | INHALATION_SPRAY | Freq: Four times a day (QID) | RESPIRATORY_TRACT | Status: DC | PRN

## 2014-06-22 NOTE — Progress Notes (Signed)
Subjective:    Patient ID: Bridget Briggs, female    DOB: 01/18/1931, 81 y.o.   MRN: [id number]  Synopsis: Referred in 2015 for evaluation of a pulmonary nodule. Found to have lingular and right middle lobe bronchiectasis which she attributes to her prior episode of pneumonia. Repeat CT chest doing in February 2016 did not show changes in the small nodules which could represent indolent mycobacterial disease.  HPI  Chief Complaint  Patient presents with  . Follow-up    Pt had follow up ct chest on 06/20/14.  pt has no other breathing complaints at this time.    Bridget Briggs says that she has been working out at [employer] three times a week and she doesn't have dyspnea with exercise.  She has been doing fine.  No cough, no mucus production.  She denies night sweats.  She walks for exercise on days she doesn't have trouble.    Past Medical History  Diagnosis Date  . GERD (gastroesophageal reflux disease)   . Arthritis   . Depression   . History of chicken pox   . Glaucoma   . Hx: UTI (urinary tract infection)   . History of colon polyps         Review of Systems  Constitutional: Negative for fever and unexpected weight change.  HENT: Negative for congestion, dental problem, ear pain, nosebleeds, postnasal drip, rhinorrhea, sinus pressure, sneezing, sore throat and trouble swallowing.   Eyes: Negative for redness and itching.  Respiratory: Negative for cough, chest tightness, shortness of breath and wheezing.   Cardiovascular: Negative for palpitations and leg swelling.       Objective:   Physical Exam Filed Vitals:   06/22/14 1023  BP: 118/66  Pulse: 70  Height: 5\' 4"  (1.626 m)  Weight: 183 lb (83.008 kg)  SpO2: 99%  RA  Gen: well appearing, no acute distress HEENT: NCAT, EOMi, OP clear, neck supple without masses PULM: CTA B CV: RRR, slight systolic murmur, no JVD AB: BS+, soft, nontender Ext: warm, no edema, no clubbing, no cyanosis Derm: no rash or skin  breakdown Neuro: A&Ox4, MAEW   11/15/2013 CT chest> mild centrilobular emphysema bilaterally, there is mild lingular bronchiectasis as well as right middle lobe bronchiectasis., There is also subpleural nodularity in the right lower lobe which is worrisome for Mycobacterium avium complex infection. February 2016 CT chest images reviewed today in clinic> no significant change in the right middle lobe nodules, and fact they're slightly smaller, right middle lobe and lingular bronchiectasis again noted. Mild centrilobular emphysema.     Assessment & Plan:   Bronchiectasis without acute exacerbation This has been a stable interval for Bridget Briggs. She has not had an exacerbation. She remains asymptomatic despite the finding of bronchiectasis on her CT chest so I do not see indication for an intervention at this time.  Plan: -I explained to her today that should she have a respiratory infection with a cough productive of purulent sputum and she should be treated with an antibiotic for a minimum of 7 days -Continue to get flu shots annually   Multiple pulmonary nodules The pulmonary nodule seen on the recent CT chest did not change after a six-month interval. Based on their location and proximity to the bronchiectasis I agree with radiology that this is likely an inflammatory process related to bronchiectasis. Though it does appear as if she could have Mycobacterium avium intracellular infection, there is no clear evidence of active disease based on her history so  I see no indication for further treatment or workup.  Plan: No further imaging at this time.     Updated Medication List Outpatient Encounter Prescriptions as of 06/22/2014  Medication Sig  . beta carotene w/minerals (OCUVITE) tablet Take 1 tablet by mouth daily.  . calcium-vitamin D (OSCAL WITH D) 500-200 MG-UNIT per tablet Take 1 tablet by mouth daily.  . diphenhydramine-acetaminophen (TYLENOL PM) 25-500 MG TABS Take 1 tablet by mouth  at bedtime as needed.  . dorzolamide-timolol (COSOPT) 22.3-6.8 MG/ML ophthalmic solution Place 1 drop into both eyes 2 (two) times daily.  . fluocinolone (SYNALAR) 0.01 % external solution Apply topically 2 (two) times daily as needed.  . latanoprost (XALATAN) 0.005 % ophthalmic solution Place 1 drop into both eyes at bedtime.  . vitamin D, CHOLECALCIFEROL, 400 UNITS tablet Take 400 Units by mouth daily.  Marland Kitchen albuterol (PROVENTIL HFA;VENTOLIN HFA) 108 (90 BASE) MCG/ACT inhaler Inhale 2 puffs into the lungs every 6 (six) hours as needed for wheezing or shortness of breath.

## 2014-06-22 NOTE — Patient Instructions (Signed)
We will see you back in 1 year or sooner if needed If you get a cold and a cough with mucus production you should se a doctor for an antibiotic

## 2014-06-22 NOTE — Assessment & Plan Note (Signed)
The pulmonary nodule seen on the recent CT chest did not change after a six-month interval. Based on their location and proximity to the bronchiectasis I agree with radiology that this is likely an inflammatory process related to bronchiectasis. Though it does appear as if she could have Mycobacterium avium intracellular infection, there is no clear evidence of active disease based on her history so I see no indication for further treatment or workup.  Plan: No further imaging at this time.

## 2014-06-22 NOTE — Assessment & Plan Note (Signed)
This has been a stable interval for Bridget Briggs. She has not had an exacerbation. She remains asymptomatic despite the finding of bronchiectasis on her CT chest so I do not see indication for an intervention at this time.  Plan: -I explained to her today that should she have a respiratory infection with a cough productive of purulent sputum and she should be treated with an antibiotic for a minimum of 7 days -Continue to get flu shots annually

## 2014-06-23 ENCOUNTER — Ambulatory Visit: Payer: Self-pay | Admitting: Internal Medicine

## 2014-06-23 LAB — HM DEXA SCAN

## 2014-06-23 LAB — HM MAMMOGRAPHY: HM MAMMO: NEGATIVE

## 2014-06-27 ENCOUNTER — Encounter: Payer: Self-pay | Admitting: *Deleted

## 2014-07-15 ENCOUNTER — Encounter: Payer: Self-pay | Admitting: Pulmonary Disease

## 2014-07-28 ENCOUNTER — Encounter: Payer: Self-pay | Admitting: Internal Medicine

## 2014-07-28 ENCOUNTER — Ambulatory Visit (INDEPENDENT_AMBULATORY_CARE_PROVIDER_SITE_OTHER): Payer: Medicare Other | Admitting: Internal Medicine

## 2014-07-28 VITALS — BP 138/80 | HR 72 | Temp 98.5°F | Ht 64.0 in | Wt 184.5 lb

## 2014-07-28 DIAGNOSIS — J479 Bronchiectasis, uncomplicated: Secondary | ICD-10-CM

## 2014-07-28 DIAGNOSIS — F329 Major depressive disorder, single episode, unspecified: Secondary | ICD-10-CM

## 2014-07-28 DIAGNOSIS — K219 Gastro-esophageal reflux disease without esophagitis: Secondary | ICD-10-CM

## 2014-07-28 DIAGNOSIS — M810 Age-related osteoporosis without current pathological fracture: Secondary | ICD-10-CM | POA: Insufficient documentation

## 2014-07-28 DIAGNOSIS — Z0000 Encounter for general adult medical examination without abnormal findings: Secondary | ICD-10-CM

## 2014-07-28 DIAGNOSIS — M79645 Pain in left finger(s): Secondary | ICD-10-CM

## 2014-07-28 DIAGNOSIS — Z86010 Personal history of colonic polyps: Secondary | ICD-10-CM

## 2014-07-28 DIAGNOSIS — Z13220 Encounter for screening for lipoid disorders: Secondary | ICD-10-CM

## 2014-07-28 DIAGNOSIS — F32A Depression, unspecified: Secondary | ICD-10-CM

## 2014-07-28 NOTE — Progress Notes (Signed)
Patient ID: Bridget Briggs, female   DOB: 1930-12-29, 81 y.o.   MRN: [id number]   Subjective:    Patient ID: Bridget Briggs, female    DOB: [birth year], 81 y.o.   MRN: [id number]  HPI  Patient here for a scheduled follow up.  Overall she feels she is doing relatively well.  Tries to stay active.  Breathing stable.  Is having pain - base of left thumb.  Increasing.  No injury.  Eating and drinking well.  Bowels stable.  Had bone density.  Revealed osteoporosis.  Discussed treatment options.     Past Medical History  Diagnosis Date  . GERD (gastroesophageal reflux disease)   . Arthritis   . Depression   . History of chicken pox   . Glaucoma   . Hx: UTI (urinary tract infection)   . History of colon polyps     Current Outpatient Prescriptions on File Prior to Visit  Medication Sig Dispense Refill  . beta carotene w/minerals (OCUVITE) tablet Take 1 tablet by mouth daily.    . calcium-vitamin D (OSCAL WITH D) 500-200 MG-UNIT per tablet Take 1 tablet by mouth daily.    . diphenhydramine-acetaminophen (TYLENOL PM) 25-500 MG TABS Take 1 tablet by mouth at bedtime as needed.    . dorzolamide-timolol (COSOPT) 22.3-6.8 MG/ML ophthalmic solution Place 1 drop into both eyes 2 (two) times daily.    . fluocinolone (SYNALAR) 0.01 % external solution Apply topically 2 (two) times daily as needed.    . latanoprost (XALATAN) 0.005 % ophthalmic solution Place 1 drop into both eyes at bedtime.    . vitamin D, CHOLECALCIFEROL, 400 UNITS tablet Take 400 Units by mouth daily.     No current facility-administered medications on file prior to visit.    Review of Systems  Constitutional: Negative for appetite change and unexpected weight change.  HENT: Negative for congestion and sinus pressure.   Respiratory: Negative for cough, chest tightness and shortness of breath.   Cardiovascular: Negative for chest pain, palpitations and leg swelling.  Gastrointestinal: Negative for nausea, vomiting, abdominal  pain and diarrhea.  Musculoskeletal:       Pain at the base of her thumb - left.  Increased.    Neurological: Negative for dizziness, light-headedness and headaches.  Psychiatric/Behavioral: Negative for dysphoric mood and agitation.       Objective:    Physical Exam  Constitutional: She appears well-developed and well-nourished. No distress.  HENT:  Nose: Nose normal.  Mouth/Throat: Oropharynx is clear and moist.  Neck: Neck supple. No thyromegaly present.  Cardiovascular: Normal rate and regular rhythm.   Pulmonary/Chest: Breath sounds normal. No respiratory distress. She has no wheezes.  Abdominal: Soft. Bowel sounds are normal. There is no tenderness.  Musculoskeletal: She exhibits no edema or tenderness.  Pain in the base of the left thumb.    Lymphadenopathy:    She has no cervical adenopathy.    BP 138/80 mmHg  Pulse 72  Temp(Src) 98.5 F (36.9 C) (Oral)  Ht 5\' 4"  (1.626 m)  Wt 184 lb 8 oz (83.689 kg)  BMI 31.65 kg/m2  SpO2 96% Wt Readings from Last 3 Encounters:  07/28/14 184 lb 8 oz (83.689 kg)  06/22/14 183 lb (83.008 kg)  04/28/14 168 lb (76.204 kg)     Lab Results  Component Value Date   WBC 8.5 10/28/2013   HGB 13.8 10/28/2013   HCT 41.4 10/28/2013   PLT 228.0 10/28/2013   GLUCOSE 72 10/28/2013   CHOL 191  04/19/2013   TRIG 116.0 04/19/2013   HDL 60.50 04/19/2013   LDLCALC 107* 04/19/2013   ALT 23 10/28/2013   AST 25 10/28/2013   NA 137 10/28/2013   K 4.6 10/28/2013   CL 103 10/28/2013   CREATININE 0.8 10/28/2013   BUN 12 10/28/2013   CO2 28 10/28/2013   TSH 3.10 10/28/2013       Assessment & Plan:   Problem List Items Addressed This Visit    Bronchiectasis without acute exacerbation    Breathing stable.  Seeing pulmonary.        Relevant Orders   Comprehensive metabolic panel   TSH   Depression    Doing better.  Follow.        GERD (gastroesophageal reflux disease)    Symptoms controlled.  Follow.       Health care  maintenance    Physical 04/28/14.  Mammogram 06/23/14 - Birads I.  Colonoscopy 2011.       History of colonic polyps    Last colonoscopy 2011.  One polyp removed.  Followed by Dr Tiffany Kocher.        Osteoporosis - Primary    Pt had bone density 06/23/14 - osteoporosis (hip -3.0).  Discussed treatment options.  Agreed to IV reclast.  Will get arranged.        Relevant Orders   Urinalysis, Routine w reflex microscopic   Pain of left thumb    Persistent pain - base of thumb.  Thumb spica.  Follow.         Other Visit Diagnoses    Screening cholesterol level        Relevant Orders    Lipid panel      I spent 25 minutes with the patient and more than 50% of the time was spent in consultation regarding the above.     Einar Pheasant, MD

## 2014-07-28 NOTE — Progress Notes (Signed)
Pre visit review using our clinic review tool, if applicable. No additional management support is needed unless otherwise documented below in the visit note. 

## 2014-08-06 ENCOUNTER — Encounter: Payer: Self-pay | Admitting: Internal Medicine

## 2014-08-06 DIAGNOSIS — M79645 Pain in left finger(s): Secondary | ICD-10-CM | POA: Insufficient documentation

## 2014-08-06 DIAGNOSIS — Z0000 Encounter for general adult medical examination without abnormal findings: Secondary | ICD-10-CM | POA: Insufficient documentation

## 2014-08-06 NOTE — Assessment & Plan Note (Addendum)
Physical 04/28/14.  Mammogram 06/23/14 - Birads I.  Colonoscopy 2011.

## 2014-08-06 NOTE — Assessment & Plan Note (Signed)
Symptoms controlled.  Follow.   

## 2014-08-06 NOTE — Assessment & Plan Note (Signed)
Pt had bone density 06/23/14 - osteoporosis (hip -3.0).  Discussed treatment options.  Agreed to IV reclast.  Will get arranged.

## 2014-08-06 NOTE — Assessment & Plan Note (Signed)
Breathing stable.  Seeing pulmonary.  ?

## 2014-08-06 NOTE — Assessment & Plan Note (Signed)
Doing better.  Follow.   

## 2014-08-06 NOTE — Assessment & Plan Note (Signed)
Last colonoscopy 2011.  One polyp removed.  Followed by Dr Tiffany Kocher.

## 2014-08-06 NOTE — Assessment & Plan Note (Signed)
Persistent pain - base of thumb.  Thumb spica.  Follow.

## 2014-09-14 ENCOUNTER — Other Ambulatory Visit (INDEPENDENT_AMBULATORY_CARE_PROVIDER_SITE_OTHER): Payer: Medicare Other

## 2014-09-14 DIAGNOSIS — J479 Bronchiectasis, uncomplicated: Secondary | ICD-10-CM

## 2014-09-14 DIAGNOSIS — Z13220 Encounter for screening for lipoid disorders: Secondary | ICD-10-CM

## 2014-09-14 DIAGNOSIS — M810 Age-related osteoporosis without current pathological fracture: Secondary | ICD-10-CM | POA: Diagnosis not present

## 2014-09-14 LAB — COMPREHENSIVE METABOLIC PANEL
ALT: 17 U/L (ref 0–35)
AST: 19 U/L (ref 0–37)
Albumin: 3.9 g/dL (ref 3.5–5.2)
Alkaline Phosphatase: 74 U/L (ref 39–117)
BUN: 13 mg/dL (ref 6–23)
CO2: 29 meq/L (ref 19–32)
Calcium: 9.3 mg/dL (ref 8.4–10.5)
Chloride: 107 mEq/L (ref 96–112)
Creatinine, Ser: 1.28 mg/dL — ABNORMAL HIGH (ref 0.40–1.20)
GFR: 42.47 mL/min — ABNORMAL LOW (ref >60.00)
GLUCOSE: 89 mg/dL (ref 70–99)
Potassium: 4.4 mEq/L (ref 3.5–5.1)
Sodium: 140 mEq/L (ref 135–145)
TOTAL PROTEIN: 6.5 g/dL (ref 6.0–8.3)
Total Bilirubin: 0.6 mg/dL (ref 0.2–1.2)

## 2014-09-14 LAB — URINALYSIS, ROUTINE W REFLEX MICROSCOPIC
Bilirubin Urine: NEGATIVE
Hgb urine dipstick: NEGATIVE
Ketones, ur: NEGATIVE
Nitrite: NEGATIVE
RBC / HPF: NONE SEEN (ref 0–?)
Specific Gravity, Urine: 1.015 (ref 1.000–1.030)
TOTAL PROTEIN, URINE-UPE24: NEGATIVE
URINE GLUCOSE: NEGATIVE
Urobilinogen, UA: 0.2 (ref 0.0–1.0)
pH: 7 (ref 5.0–8.0)

## 2014-09-14 LAB — LIPID PANEL
CHOL/HDL RATIO: 3
Cholesterol: 185 mg/dL (ref 0–200)
HDL: 62 mg/dL (ref >39.00)
LDL CALC: 98 mg/dL (ref 0–99)
NONHDL: 123
Triglycerides: 123 mg/dL (ref 0.0–149.0)
VLDL: 24.6 mg/dL (ref 0.0–40.0)

## 2014-09-14 LAB — TSH: TSH: 4.43 u[IU]/mL (ref 0.35–4.50)

## 2014-09-15 ENCOUNTER — Other Ambulatory Visit: Payer: Self-pay | Admitting: Internal Medicine

## 2014-09-15 ENCOUNTER — Other Ambulatory Visit: Payer: Self-pay | Admitting: *Deleted

## 2014-09-15 DIAGNOSIS — N289 Disorder of kidney and ureter, unspecified: Secondary | ICD-10-CM

## 2014-09-15 DIAGNOSIS — R8281 Pyuria: Secondary | ICD-10-CM

## 2014-09-15 NOTE — Progress Notes (Signed)
Order placed for f/u labs.  

## 2014-09-18 LAB — CULTURE, URINE COMPREHENSIVE: Colony Count: >=100000

## 2014-09-19 ENCOUNTER — Telehealth: Payer: Self-pay | Admitting: *Deleted

## 2014-09-19 ENCOUNTER — Other Ambulatory Visit: Payer: Self-pay | Admitting: *Deleted

## 2014-09-19 MED ORDER — AMOXICILLIN 875 MG PO TABS: 875.0000 mg | ORAL_TABLET | Freq: Two times a day (BID) | ORAL | Status: DC

## 2014-09-19 NOTE — Telephone Encounter (Signed)
Pt notified of urine cx results & Abx sent to pharmacy

## 2014-09-20 ENCOUNTER — Other Ambulatory Visit (INDEPENDENT_AMBULATORY_CARE_PROVIDER_SITE_OTHER): Payer: Medicare Other

## 2014-09-20 DIAGNOSIS — N289 Disorder of kidney and ureter, unspecified: Secondary | ICD-10-CM

## 2014-09-20 LAB — BASIC METABOLIC PANEL
BUN: 14 mg/dL (ref 6–23)
CALCIUM: 10 mg/dL (ref 8.4–10.5)
CHLORIDE: 100 meq/L (ref 96–112)
CO2: 28 meq/L (ref 19–32)
Creatinine, Ser: 0.82 mg/dL (ref 0.40–1.20)
GFR: 71 mL/min (ref >60.00)
Glucose, Bld: 86 mg/dL (ref 70–99)
Potassium: 4.5 mEq/L (ref 3.5–5.1)
Sodium: 133 mEq/L — ABNORMAL LOW (ref 135–145)

## 2014-09-21 ENCOUNTER — Other Ambulatory Visit: Payer: Self-pay | Admitting: Internal Medicine

## 2014-09-21 ENCOUNTER — Encounter: Payer: Self-pay | Admitting: *Deleted

## 2014-09-21 DIAGNOSIS — E871 Hypo-osmolality and hyponatremia: Secondary | ICD-10-CM

## 2014-09-21 NOTE — Progress Notes (Signed)
Order placed for f/u sodium.  ?

## 2014-09-27 ENCOUNTER — Other Ambulatory Visit: Payer: Self-pay | Admitting: *Deleted

## 2014-09-27 ENCOUNTER — Telehealth: Payer: Self-pay | Admitting: *Deleted

## 2014-09-27 MED ORDER — LEVOFLOXACIN 500 MG PO TABS: 500.0000 mg | ORAL_TABLET | Freq: Every day | ORAL | Status: DC

## 2014-09-27 NOTE — Telephone Encounter (Signed)
Spoke with pt, she states she does not have an allergy to Levaquin or Cipro.  Levaquin sent to pharmacy as requested.

## 2014-09-27 NOTE — Telephone Encounter (Signed)
I am not sure if the aching is coming from the amoxicillin.  Can stop it and see.  I believe she is still having urinary symptoms is because her infection has not been fully treated.  Confirm no allergy to levaquin (or cipro).  If no, then levaquin 500mg  q day x 5 days.  If any worsening problems or change in sx or persistent sx, will need to be evaluated.

## 2014-09-27 NOTE — Telephone Encounter (Signed)
Pt called states after taking Amoxicillin for only 2 days she began having terrible joint pain.  Pt states she called then to report it and the representative said they would sent you the message.  Pt further states she is still having urinary symptoms.  Please advise.

## 2014-10-05 ENCOUNTER — Other Ambulatory Visit (INDEPENDENT_AMBULATORY_CARE_PROVIDER_SITE_OTHER): Payer: Medicare Other

## 2014-10-05 ENCOUNTER — Other Ambulatory Visit: Payer: Self-pay | Admitting: Internal Medicine

## 2014-10-05 DIAGNOSIS — E871 Hypo-osmolality and hyponatremia: Secondary | ICD-10-CM

## 2014-10-05 LAB — SODIUM: Sodium: 133 mEq/L — ABNORMAL LOW (ref 135–145)

## 2014-10-05 NOTE — Progress Notes (Signed)
Order placed for f/u sodium.  ?

## 2014-10-06 ENCOUNTER — Encounter: Payer: Self-pay | Admitting: *Deleted

## 2014-10-10 DIAGNOSIS — M810 Age-related osteoporosis without current pathological fracture: Secondary | ICD-10-CM | POA: Insufficient documentation

## 2014-11-03 ENCOUNTER — Other Ambulatory Visit: Payer: Medicare Other

## 2014-11-10 ENCOUNTER — Other Ambulatory Visit (INDEPENDENT_AMBULATORY_CARE_PROVIDER_SITE_OTHER): Payer: Medicare Other

## 2014-11-10 DIAGNOSIS — E871 Hypo-osmolality and hyponatremia: Secondary | ICD-10-CM | POA: Diagnosis not present

## 2014-11-10 LAB — SODIUM: Sodium: 137 mEq/L (ref 135–145)

## 2014-11-11 ENCOUNTER — Encounter: Payer: Self-pay | Admitting: *Deleted

## 2014-11-28 ENCOUNTER — Encounter: Payer: Self-pay | Admitting: *Deleted

## 2014-11-28 ENCOUNTER — Encounter: Payer: Self-pay | Admitting: Internal Medicine

## 2014-11-28 ENCOUNTER — Ambulatory Visit (INDEPENDENT_AMBULATORY_CARE_PROVIDER_SITE_OTHER): Payer: Medicare Other | Admitting: Internal Medicine

## 2014-11-28 VITALS — BP 120/64 | HR 65 | Temp 98.2°F | Ht 64.0 in | Wt 184.5 lb

## 2014-11-28 DIAGNOSIS — Z9109 Other allergy status, other than to drugs and biological substances: Secondary | ICD-10-CM

## 2014-11-28 DIAGNOSIS — F329 Major depressive disorder, single episode, unspecified: Secondary | ICD-10-CM

## 2014-11-28 DIAGNOSIS — M810 Age-related osteoporosis without current pathological fracture: Secondary | ICD-10-CM

## 2014-11-28 DIAGNOSIS — IMO0001 Reserved for inherently not codable concepts without codable children: Secondary | ICD-10-CM

## 2014-11-28 DIAGNOSIS — K219 Gastro-esophageal reflux disease without esophagitis: Secondary | ICD-10-CM

## 2014-11-28 DIAGNOSIS — I6529 Occlusion and stenosis of unspecified carotid artery: Secondary | ICD-10-CM | POA: Diagnosis not present

## 2014-11-28 DIAGNOSIS — Z0000 Encounter for general adult medical examination without abnormal findings: Secondary | ICD-10-CM

## 2014-11-28 DIAGNOSIS — E871 Hypo-osmolality and hyponatremia: Secondary | ICD-10-CM | POA: Diagnosis not present

## 2014-11-28 DIAGNOSIS — Z91048 Other nonmedicinal substance allergy status: Secondary | ICD-10-CM

## 2014-11-28 DIAGNOSIS — R938 Abnormal findings on diagnostic imaging of other specified body structures: Secondary | ICD-10-CM

## 2014-11-28 DIAGNOSIS — R9389 Abnormal findings on diagnostic imaging of other specified body structures: Secondary | ICD-10-CM

## 2014-11-28 DIAGNOSIS — Z860100 Personal history of colon polyps, unspecified: Secondary | ICD-10-CM

## 2014-11-28 DIAGNOSIS — F32A Depression, unspecified: Secondary | ICD-10-CM

## 2014-11-28 DIAGNOSIS — J479 Bronchiectasis, uncomplicated: Secondary | ICD-10-CM | POA: Diagnosis not present

## 2014-11-28 DIAGNOSIS — R143 Flatulence: Secondary | ICD-10-CM

## 2014-11-28 LAB — BASIC METABOLIC PANEL
BUN: 13 mg/dL (ref 6–23)
CHLORIDE: 102 meq/L (ref 96–112)
CO2: 27 mEq/L (ref 19–32)
CREATININE: 0.8 mg/dL (ref 0.40–1.20)
Calcium: 9.5 mg/dL (ref 8.4–10.5)
GFR: 73.02 mL/min (ref >60.00)
GLUCOSE: 75 mg/dL (ref 70–99)
POTASSIUM: 4.5 meq/L (ref 3.5–5.1)
SODIUM: 137 meq/L (ref 135–145)

## 2014-11-28 NOTE — Patient Instructions (Signed)
Align - one per day 

## 2014-11-28 NOTE — Progress Notes (Signed)
Patient ID: Bridget Briggs, female   DOB: [birth year], 81 y.o.   MRN: [id number]   Subjective:    Patient ID: Bridget Briggs, female    DOB: August 01, 1930, 81 y.o.   MRN: [id number]  HPI  Patient here for a scheduled follow up.  She is exercising.  Goes to the [facility] three days per week.  She is also walking.  No cardiac symptoms with increased activity or exertion.  No sob.  Eating and drinking well.  Does report increased gas.  No abdominal pain.  Bowels stable.  Had reclast infusion.  Tolerated.     Past Medical History  Diagnosis Date  . GERD (gastroesophageal reflux disease)   . Arthritis   . Depression   . History of chicken pox   . Glaucoma   . Hx: UTI (urinary tract infection)   . History of colon polyps     Outpatient Encounter Prescriptions as of 11/28/2014  Medication Sig  . beta carotene w/minerals (OCUVITE) tablet Take 1 tablet by mouth daily.  . calcium-vitamin D (OSCAL WITH D) 500-200 MG-UNIT per tablet Take 1 tablet by mouth daily.  . diphenhydramine-acetaminophen (TYLENOL PM) 25-500 MG TABS Take 1 tablet by mouth at bedtime as needed.  . dorzolamide-timolol (COSOPT) 22.3-6.8 MG/ML ophthalmic solution Place 1 drop into both eyes 2 (two) times daily.  . fluocinolone (SYNALAR) 0.01 % external solution Apply topically 2 (two) times daily as needed.  . latanoprost (XALATAN) 0.005 % ophthalmic solution Place 1 drop into both eyes at bedtime.  . vitamin D, CHOLECALCIFEROL, 400 UNITS tablet Take 400 Units by mouth daily.  . [DISCONTINUED] levofloxacin (LEVAQUIN) 500 MG tablet Take 1 tablet (500 mg total) by mouth daily.   No facility-administered encounter medications on file as of 11/28/2014.    Review of Systems  Constitutional: Negative for appetite change and unexpected weight change.  HENT: Negative for congestion and sinus pressure.   Respiratory: Negative for cough, chest tightness and shortness of breath.   Cardiovascular: Negative for chest pain, palpitations and leg  swelling.  Gastrointestinal: Negative for nausea, vomiting, abdominal pain and diarrhea.  Genitourinary: Negative for dysuria and difficulty urinating.  Skin: Negative for color change and rash.  Neurological: Negative for dizziness, light-headedness and headaches.  Psychiatric/Behavioral: Negative for dysphoric mood and agitation.       Objective:    Physical Exam  Constitutional: She appears well-developed and well-nourished. No distress.  HENT:  Nose: Nose normal.  Mouth/Throat: Oropharynx is clear and moist.  Neck: Neck supple. No thyromegaly present.  Cardiovascular: Normal rate and regular rhythm.   Pulmonary/Chest: Breath sounds normal. No respiratory distress. She has no wheezes.  Abdominal: Soft. Bowel sounds are normal. There is no tenderness.  Musculoskeletal: She exhibits no edema or tenderness.  Lymphadenopathy:    She has no cervical adenopathy.  Skin: No rash noted. No erythema.  Psychiatric: She has a normal mood and affect. Her behavior is normal.    BP 120/64 mmHg  Pulse 65  Temp(Src) 98.2 F (36.8 C) (Oral)  Ht 5\' 4"  (1.626 m)  Wt 184 lb 8 oz (83.689 kg)  BMI 31.65 kg/m2  SpO2 96% Wt Readings from Last 3 Encounters:  11/28/14 184 lb 8 oz (83.689 kg)  07/28/14 184 lb 8 oz (83.689 kg)  06/22/14 183 lb (83.008 kg)     Lab Results  Component Value Date   WBC 8.5 10/28/2013   HGB 13.8 10/28/2013   HCT 41.4 10/28/2013   PLT 228.0 10/28/2013  GLUCOSE 75 11/28/2014   CHOL 185 09/14/2014   TRIG 123.0 09/14/2014   HDL 62.00 09/14/2014   LDLCALC 98 09/14/2014   ALT 17 09/14/2014   AST 19 09/14/2014   NA 137 11/28/2014   K 4.5 11/28/2014   CL 102 11/28/2014   CREATININE 0.80 11/28/2014   BUN 13 11/28/2014   CO2 27 11/28/2014   TSH 4.43 09/14/2014       Assessment & Plan:   Problem List Items Addressed This Visit    Abnormal CXR    Follow up CT - stable to slightly improved.  Seeing Dr Lake Bells.  Breathing stable.        Bronchiectasis  without acute exacerbation    No sob.  Has seen pulmonary.  Breathing stable.        Carotid stenosis    F/u carotid ultrasound 02/06/13 - no hemodynamically significant stenosis.  (left internal 1-49%).        Depression    Doing well.       Environmental allergies    Controlled.       Gas    Increased gas as outlined.  Start align daily.  Follow.       GERD (gastroesophageal reflux disease)    No symptoms.        Health care maintenance    Schedule 04/28/14.  Mammogram 06/23/14 - Birads I.  Colonoscopy 2011.        History of colonic polyps    Last colonoscopy 2011.  One polyp removed.  Followed by Dr Tiffany Kocher.       Osteoporosis    Bone density 2//4/16 - osteoporosis (hip -3.0).  Received reclast.         Other Visit Diagnoses    Hyponatremia    -  Primary    Relevant Orders    Basic metabolic panel (Completed)      I spent 25 minutes with the patient and more than 50% of the time was spent in consultation regarding the above.     Einar Pheasant, MD

## 2014-11-28 NOTE — Progress Notes (Signed)
Pre visit review using our clinic review tool, if applicable. No additional management support is needed unless otherwise documented below in the visit note. 

## 2014-12-04 ENCOUNTER — Encounter: Payer: Self-pay | Admitting: Internal Medicine

## 2014-12-04 NOTE — Assessment & Plan Note (Signed)
Controlled.  

## 2014-12-04 NOTE — Assessment & Plan Note (Signed)
Follow up CT - stable to slightly improved.  Seeing Dr Lake Bells.  Breathing stable.

## 2014-12-04 NOTE — Assessment & Plan Note (Signed)
No symptoms 

## 2014-12-04 NOTE — Assessment & Plan Note (Signed)
Last colonoscopy 2011.  One polyp removed.  Followed by Dr Tiffany Kocher.

## 2014-12-04 NOTE — Assessment & Plan Note (Signed)
Schedule 04/28/14.  Mammogram 06/23/14 - Birads I.  Colonoscopy 2011.

## 2014-12-04 NOTE — Assessment & Plan Note (Signed)
No sob.  Has seen pulmonary.  Breathing stable.

## 2014-12-04 NOTE — Assessment & Plan Note (Signed)
F/u carotid ultrasound 02/06/13 - no hemodynamically significant stenosis.  (left internal 1-49%).

## 2014-12-04 NOTE — Assessment & Plan Note (Signed)
Bone density 2//4/16 - osteoporosis (hip -3.0).  Received reclast.

## 2014-12-04 NOTE — Assessment & Plan Note (Signed)
Doing well 

## 2014-12-04 NOTE — Assessment & Plan Note (Signed)
Increased gas as outlined.  Start align daily.  Follow.

## 2015-02-06 ENCOUNTER — Encounter: Payer: Self-pay | Admitting: Family Medicine

## 2015-02-06 ENCOUNTER — Encounter (INDEPENDENT_AMBULATORY_CARE_PROVIDER_SITE_OTHER): Payer: Self-pay

## 2015-02-06 ENCOUNTER — Ambulatory Visit (INDEPENDENT_AMBULATORY_CARE_PROVIDER_SITE_OTHER)
Admission: RE | Admit: 2015-02-06 | Discharge: 2015-02-06 | Disposition: A | Discharge status: Home / Self Care | Payer: Medicare Other | Source: Ambulatory Visit | Attending: Family Medicine | Admitting: Family Medicine

## 2015-02-06 ENCOUNTER — Ambulatory Visit (INDEPENDENT_AMBULATORY_CARE_PROVIDER_SITE_OTHER): Payer: Medicare Other | Admitting: Family Medicine

## 2015-02-06 VITALS — BP 148/72 | HR 83 | Temp 97.9°F | Ht 64.0 in | Wt 186.1 lb

## 2015-02-06 DIAGNOSIS — R079 Chest pain, unspecified: Secondary | ICD-10-CM | POA: Diagnosis not present

## 2015-02-06 DIAGNOSIS — R05 Cough: Secondary | ICD-10-CM

## 2015-02-06 DIAGNOSIS — R059 Cough, unspecified: Secondary | ICD-10-CM

## 2015-02-06 MED ORDER — LEVOFLOXACIN 500 MG PO TABS: 500.0000 mg | ORAL_TABLET | Freq: Every day | ORAL | Status: DC

## 2015-02-06 NOTE — Progress Notes (Signed)
Pre visit review using our clinic review tool, if applicable. No additional management support is needed unless otherwise documented below in the visit note. 

## 2015-02-06 NOTE — Patient Instructions (Signed)
Take the antibiotic as prescribed.  Please be sure to follow up later this week or early next week.  If you worsen, please go to the hospital.  Take care  Dr. Lacinda Axon

## 2015-02-07 NOTE — Assessment & Plan Note (Signed)
Patient history of bronchiectasis. X-ray obtained and revealed a lower left lung infiltrate. Treating with Levaquin.

## 2015-02-07 NOTE — Assessment & Plan Note (Signed)
EKG was obtained and was unchanged. Unlikely to be cardiac in nature given history and exam. Advised the patient to go to the emergency room if it worsens.

## 2015-02-07 NOTE — Progress Notes (Signed)
Subjective:  Patient ID: AREONA HOMER, female    DOB: Apr 21, 1931  Age: 81 y.o. MRN: [id number]  CC: Cough, chest pressure  HPI:  81 year old female with a PMH of bronchiectasis presents with the above complaints.  Patient reports that she has been sick for approximately one week. She has been experiencing productive cough and associated chest "pressure". She was seen at a local urgent care and was diagnosed with bronchitis and treated with azithromycin. She states that she has completed the course but still remains symptomatic.  No relieving factors. Regarding her chest pressure she states that it is constant. Is located in the center of the chest. She describes it as a tightness/pressure. No association with exertion. No radiation. No associated nausea, vomiting, diaphoresis. No associated shortness of breath.  Social Hx   Social History   Social History  . Marital Status: Married    Spouse Name: N/A  . Number of Children: N/A  . Years of Education: N/A   Social History Main Topics  . Smoking status: Former Smoker -- 1.00 packs/day for 30 years    Types: Cigarettes    Quit date: 05/20/1988  . Smokeless tobacco: Never Used  . Alcohol Use: No  . Drug Use: No  . Sexual Activity: Not Asked   Other Topics Concern  . None   Social History Narrative   Review of Systems Per HPI Objective:  BP 148/72 mmHg  Pulse 83  Temp(Src) 97.9 F (36.6 C) (Oral)  Ht 5\' 4"  (1.626 m)  Wt 186 lb 2 oz (84.426 kg)  BMI 31.93 kg/m2  SpO2 97%  BP/Weight 02/06/2015 11/28/2014 11/17/1599  Systolic BP 093 235 573  Diastolic BP 72 64 80  Wt. (Lbs) 186.13 184.5 184.5  BMI 31.93 31.65 31.65   Physical Exam  Constitutional: She is oriented to person, place, and time. No distress.  HENT:  Head: Normocephalic and atraumatic.  Cardiovascular: Normal rate and regular rhythm.   Pulmonary/Chest: Effort normal and breath sounds normal. No respiratory distress. She has no wheezes. She has no rales.  She exhibits tenderness.    Neurological: She is alert and oriented to person, place, and time.  Psychiatric: She has a normal mood and affect.  Vitals reviewed.  Lab Results  Component Value Date   WBC 8.5 10/28/2013   HGB 13.8 10/28/2013   HCT 41.4 10/28/2013   PLT 228.0 10/28/2013   GLUCOSE 75 11/28/2014   CHOL 185 09/14/2014   TRIG 123.0 09/14/2014   HDL 62.00 09/14/2014   LDLCALC 98 09/14/2014   ALT 17 09/14/2014   AST 19 09/14/2014   NA 137 11/28/2014   K 4.5 11/28/2014   CL 102 11/28/2014   CREATININE 0.80 11/28/2014   BUN 13 11/28/2014   CO2 27 11/28/2014   TSH 4.43 09/14/2014   EKG: unchanged from previous tracings, nonspecific ST and T waves changes. Normal sinus rhythm.  Assessment & Plan:   Problem List Items Addressed This Visit    Chest pain - Primary    EKG was obtained and was unchanged. Unlikely to be cardiac in nature given history and exam. Advised the patient to go to the emergency room if it worsens.      Relevant Orders   EKG 12-Lead (Completed)   Cough    Patient history of bronchiectasis. X-ray obtained and revealed a lower left lung infiltrate. Treating with Levaquin.      Relevant Orders   DG Chest 2 View (Completed)  Meds ordered this encounter  Medications  . benzonatate (TESSALON) 100 MG capsule    Sig: TAKE 1 CAPSULE BY MOUTH 3 TIMES A DAY AS NEEDED *NOT COVER*    Refill:  0  . levofloxacin (LEVAQUIN) 500 MG tablet    Sig: Take 1 tablet (500 mg total) by mouth daily.    Dispense:  7 tablet    Refill:  0    Follow-up: PRN  Thersa Salt, DO

## 2015-02-14 ENCOUNTER — Telehealth: Payer: Self-pay

## 2015-02-14 NOTE — Telephone Encounter (Signed)
Pt was seen by Dr. Lacinda Axon and he ordered a DG of her chest and he told her is was likely pneumonia. She is still have chest pain and has been through two rounds of antibiotic. Please advise?

## 2015-02-14 NOTE — Telephone Encounter (Signed)
Xray revealed LLL infiltrate. If she is still having symptoms she needs to see PCP.

## 2015-02-14 NOTE — Telephone Encounter (Signed)
Spoke to pt.  Will see her tomorrow.  Pt aware.  Please put her on the schedule for tomorrow at 1:00.  Work in f/u pneumonia.

## 2015-02-14 NOTE — Telephone Encounter (Signed)
She feels pressure on her chest, she said the same from when she was here, and is coughing with nasal discharge nothing to serious. Robitussin got some of it up and is working on her second bottle. She still cant sleep and is feeling bad.

## 2015-02-14 NOTE — Telephone Encounter (Signed)
Need to know what specifically is going on.  Is she better than when here?  Any cough or congestion?  Need to know how acute - to know where need to work in.  Thanks

## 2015-02-15 ENCOUNTER — Encounter: Payer: Self-pay | Admitting: Internal Medicine

## 2015-02-15 ENCOUNTER — Ambulatory Visit (INDEPENDENT_AMBULATORY_CARE_PROVIDER_SITE_OTHER): Payer: Medicare Other | Admitting: Internal Medicine

## 2015-02-15 VITALS — BP 110/62 | HR 85 | Temp 98.2°F | Resp 18 | Wt 184.2 lb

## 2015-02-15 DIAGNOSIS — R938 Abnormal findings on diagnostic imaging of other specified body structures: Secondary | ICD-10-CM | POA: Diagnosis not present

## 2015-02-15 DIAGNOSIS — J479 Bronchiectasis, uncomplicated: Secondary | ICD-10-CM | POA: Diagnosis not present

## 2015-02-15 DIAGNOSIS — J189 Pneumonia, unspecified organism: Secondary | ICD-10-CM | POA: Diagnosis not present

## 2015-02-15 DIAGNOSIS — R0789 Other chest pain: Secondary | ICD-10-CM

## 2015-02-15 DIAGNOSIS — R9389 Abnormal findings on diagnostic imaging of other specified body structures: Secondary | ICD-10-CM

## 2015-02-15 LAB — CBC WITH DIFFERENTIAL/PLATELET
BASOS PCT: 0.5 % (ref 0.0–3.0)
Basophils Absolute: 0 10*3/uL (ref 0.0–0.1)
EOS ABS: 0.2 10*3/uL (ref 0.0–0.7)
Eosinophils Relative: 1.5 % (ref 0.0–5.0)
HEMATOCRIT: 44.1 % (ref 36.0–46.0)
Hemoglobin: 14.4 g/dL (ref 12.0–15.0)
LYMPHS ABS: 2.9 10*3/uL (ref 0.7–4.0)
LYMPHS PCT: 28.4 % (ref 12.0–46.0)
MCHC: 32.7 g/dL (ref 30.0–36.0)
MCV: 87.1 fl (ref 78.0–100.0)
Monocytes Absolute: 0.8 10*3/uL (ref 0.1–1.0)
Monocytes Relative: 7.3 % (ref 3.0–12.0)
NEUTROS ABS: 6.4 10*3/uL (ref 1.4–7.7)
Neutrophils Relative %: 62.3 % (ref 43.0–77.0)
PLATELETS: 289 10*3/uL (ref 150.0–400.0)
RBC: 5.07 Mil/uL (ref 3.87–5.11)
RDW: 14.4 % (ref 11.5–15.5)
WBC: 10.3 10*3/uL (ref 4.0–10.5)

## 2015-02-15 LAB — BASIC METABOLIC PANEL
BUN: 12 mg/dL (ref 6–23)
CO2: 31 mEq/L (ref 19–32)
CREATININE: 0.78 mg/dL (ref 0.40–1.20)
Calcium: 9.8 mg/dL (ref 8.4–10.5)
Chloride: 101 mEq/L (ref 96–112)
GFR: 75.14 mL/min (ref >60.00)
Glucose, Bld: 82 mg/dL (ref 70–99)
POTASSIUM: 4.8 meq/L (ref 3.5–5.1)
Sodium: 137 mEq/L (ref 135–145)

## 2015-02-15 MED ORDER — LEVOFLOXACIN 500 MG PO TABS: 500.0000 mg | ORAL_TABLET | Freq: Every day | ORAL | Status: DC

## 2015-02-15 NOTE — Progress Notes (Signed)
Pre visit review using our clinic review tool, if applicable. No additional management support is needed unless otherwise documented below in the visit note. 

## 2015-02-15 NOTE — Assessment & Plan Note (Signed)
EKG as outlined.  Persistent pressure.  Feel related to pulmonary etiology.  Treat infection.  Check CT chest.  Will have cardiology review to confirm no cardiac etiology.

## 2015-02-15 NOTE — Progress Notes (Signed)
Patient ID: Bridget Briggs, female   DOB: 04-13-1931, 81 y.o.   MRN: [id number]   Subjective:    Patient ID: Bridget Briggs, female    DOB: [birth year], 81 y.o.   MRN: [id number]  HPI  Patient here as a work in with persistent chest heaviness.  Was evaluated on 02/06/15 by Dr Lacinda Axon.  See his note for details.  EKG without significant changes.  CXR with concerns over pneumonia.  Treated with Levaquin.  Has been taking robitussin.  Has been coughing up yellow mucus.  Not coughing up as much now.  Clear mucus now.  Still with chest pressure.  Woke her up from sleep a few days ago.  She is weak when trying to walk or do increased activity.   Is eating.  No vomiting.  Bowels stable.  Subjective fever.  No headache.  No rash.   Past Medical History  Diagnosis Date  . GERD (gastroesophageal reflux disease)   . Arthritis   . Depression   . History of chicken pox   . Glaucoma   . Hx: UTI (urinary tract infection)   . History of colon polyps    Past Surgical History  Procedure Laterality Date  . Cholecystectomy  1996  . Appendectomy  1974  . Tonsillectomy and adenoidectomy  1940  . Breast surgery Right 1988    biopsy  . Abdominal hysterectomy  1974    left ovary removed  . Ankle surgery Left 1998  . Nissen fundoplication  2992  . Eye surgery Bilateral 2005/2006    cataract  . Laparoscopic gastric banding with hiatal hernia repair  2000   Family History  Problem Relation Age of Onset  . Arthritis Mother   . Leukemia Mother   . Lung cancer Father   . Osteoporosis Other    Social History   Social History  . Marital Status: Married    Spouse Name: N/A  . Number of Children: N/A  . Years of Education: N/A   Social History Main Topics  . Smoking status: Former Smoker -- 1.00 packs/day for 30 years    Types: Cigarettes    Quit date: 05/20/1988  . Smokeless tobacco: Never Used  . Alcohol Use: No  . Drug Use: No  . Sexual Activity: Not Asked   Other Topics Concern  . None    Social History Narrative    Outpatient Encounter Prescriptions as of 02/15/2015  Medication Sig  . benzonatate (TESSALON) 100 MG capsule TAKE 1 CAPSULE BY MOUTH 3 TIMES A DAY AS NEEDED *NOT COVER*  . beta carotene w/minerals (OCUVITE) tablet Take 1 tablet by mouth daily.  . calcium-vitamin D (OSCAL WITH D) 500-200 MG-UNIT per tablet Take 1 tablet by mouth daily.  . diphenhydramine-acetaminophen (TYLENOL PM) 25-500 MG TABS Take 1 tablet by mouth at bedtime as needed.  . dorzolamide-timolol (COSOPT) 22.3-6.8 MG/ML ophthalmic solution Place 1 drop into both eyes 2 (two) times daily.  . fluocinolone (SYNALAR) 0.01 % external solution Apply topically 2 (two) times daily as needed.  . latanoprost (XALATAN) 0.005 % ophthalmic solution Place 1 drop into both eyes at bedtime.  . vitamin D, CHOLECALCIFEROL, 400 UNITS tablet Take 400 Units by mouth daily.  Marland Kitchen levofloxacin (LEVAQUIN) 500 MG tablet Take 1 tablet (500 mg total) by mouth daily.  . [DISCONTINUED] levofloxacin (LEVAQUIN) 500 MG tablet Take 1 tablet (500 mg total) by mouth daily. (Patient not taking: Reported on 02/15/2015)   No facility-administered encounter medications on file as of 02/15/2015.  Review of Systems  Constitutional: Positive for fever (subjective fever. ) and fatigue. Negative for appetite change and unexpected weight change.  HENT: Positive for congestion (runny nose.  ). Negative for sore throat.   Respiratory: Positive for cough and chest tightness. Negative for shortness of breath.   Cardiovascular: Negative for palpitations and leg swelling.  Gastrointestinal: Negative for nausea, vomiting, abdominal pain and diarrhea.  Skin: Negative for color change and rash.  Neurological: Negative for dizziness and headaches.       Objective:    Physical Exam  Constitutional: She appears well-developed and well-nourished.  HENT:  Nose: Nose normal.  Mouth/Throat: Oropharynx is clear and moist.  Eyes: Conjunctivae are  normal. Right eye exhibits no discharge. Left eye exhibits no discharge.  Neck: Neck supple.  Cardiovascular: Normal rate and regular rhythm.   Pulmonary/Chest: Breath sounds normal. No respiratory distress. She has no wheezes.  Some minimal increased cough with forced expiration.    Abdominal: Soft. Bowel sounds are normal. There is no tenderness.  Musculoskeletal: She exhibits no edema or tenderness.  Lymphadenopathy:    She has no cervical adenopathy.  Skin: No rash noted. No erythema.    BP 110/62 mmHg  Pulse 85  Temp(Src) 98.2 F (36.8 C) (Oral)  Resp 18  Wt 184 lb 4 oz (83.575 kg)  SpO2 99% Wt Readings from Last 3 Encounters:  02/15/15 184 lb 4 oz (83.575 kg)  02/06/15 186 lb 2 oz (84.426 kg)  11/28/14 184 lb 8 oz (83.689 kg)     Lab Results  Component Value Date   WBC 10.3 02/15/2015   HGB 14.4 02/15/2015   HCT 44.1 02/15/2015   PLT 289.0 02/15/2015   GLUCOSE 82 02/15/2015   CHOL 185 09/14/2014   TRIG 123.0 09/14/2014   HDL 62.00 09/14/2014   LDLCALC 98 09/14/2014   ALT 17 09/14/2014   AST 19 09/14/2014   NA 137 02/15/2015   K 4.8 02/15/2015   CL 101 02/15/2015   CREATININE 0.78 02/15/2015   BUN 12 02/15/2015   CO2 31 02/15/2015   TSH 4.43 09/14/2014    Dg Chest 2 View  02/06/2015   CLINICAL DATA:  Former smoker with cough.  EXAM: CHEST  2 VIEW  COMPARISON:  None.  FINDINGS: Streaky densities at the left lung base are identified on the frontal view. These densities are not clearly identified on the lateral view. Upper lungs are clear. Heart and mediastinum are within normal limits. Surgical clips in the right upper abdomen.  IMPRESSION: Streaky densities in the left lower chest could represent a small focus of infection. Followup PA and lateral chest X-ray is recommended in 3-4 weeks following trial of antibiotic therapy to ensure resolution and exclude underlying malignancy.   Electronically Signed   By: Markus Daft M.D.   On: 02/06/2015 16:53         Assessment & Plan:   Problem List Items Addressed This Visit    Abnormal CXR    CXR as outlined.  Given persistent chest pressure, will check CT scan.  Treat infection.        Relevant Orders   CT Angio Chest W/Cm &/Or Wo Cm   Bronchiectasis without acute exacerbation    Has seen pulmonary and diagnosed with bronchiectasis.  With chest heaviness.  Unclear as to the exact etiology.  May all be related to the infection, etc.  Extend the abx - levaquin daily.  Continue robitussin.  Check CT scan.  Relevant Orders   CT Angio Chest W/Cm &/Or Wo Cm   Chest pressure - Primary    EKG as outlined.  Persistent pressure.  Feel related to pulmonary etiology.  Treat infection.  Check CT chest.  Will have cardiology review to confirm no cardiac etiology.        Relevant Orders   Ambulatory referral to Cardiology   CT Angio Chest W/Cm &/Or Wo Cm    Other Visit Diagnoses    Pneumonia, organism unspecified        Relevant Medications    levofloxacin (LEVAQUIN) 500 MG tablet    Other Relevant Orders    CBC with Differential/Platelet (Completed)    Basic metabolic panel (Completed)    CT Angio Chest W/Cm &/Or Wo Cm        Einar Pheasant, MD

## 2015-02-15 NOTE — Assessment & Plan Note (Signed)
CXR as outlined.  Given persistent chest pressure, will check CT scan.  Treat infection.

## 2015-02-15 NOTE — Assessment & Plan Note (Signed)
Has seen pulmonary and diagnosed with bronchiectasis.  With chest heaviness.  Unclear as to the exact etiology.  May all be related to the infection, etc.  Extend the abx - levaquin daily.  Continue robitussin.  Check CT scan.

## 2015-02-16 ENCOUNTER — Ambulatory Visit
Admission: RE | Admit: 2015-02-16 | Discharge: 2015-02-16 | Disposition: A | Discharge status: Home / Self Care | Payer: Medicare Other | Source: Ambulatory Visit | Attending: Internal Medicine | Admitting: Internal Medicine

## 2015-02-16 ENCOUNTER — Telehealth: Payer: Self-pay

## 2015-02-16 DIAGNOSIS — D1803 Hemangioma of intra-abdominal structures: Secondary | ICD-10-CM | POA: Diagnosis not present

## 2015-02-16 DIAGNOSIS — I2510 Atherosclerotic heart disease of native coronary artery without angina pectoris: Secondary | ICD-10-CM | POA: Diagnosis not present

## 2015-02-16 DIAGNOSIS — R918 Other nonspecific abnormal finding of lung field: Secondary | ICD-10-CM | POA: Insufficient documentation

## 2015-02-16 DIAGNOSIS — R938 Abnormal findings on diagnostic imaging of other specified body structures: Secondary | ICD-10-CM | POA: Diagnosis present

## 2015-02-16 DIAGNOSIS — J9811 Atelectasis: Secondary | ICD-10-CM | POA: Insufficient documentation

## 2015-02-16 DIAGNOSIS — J479 Bronchiectasis, uncomplicated: Secondary | ICD-10-CM

## 2015-02-16 DIAGNOSIS — R9389 Abnormal findings on diagnostic imaging of other specified body structures: Secondary | ICD-10-CM

## 2015-02-16 DIAGNOSIS — R0789 Other chest pain: Secondary | ICD-10-CM

## 2015-02-16 DIAGNOSIS — J189 Pneumonia, unspecified organism: Secondary | ICD-10-CM

## 2015-02-16 MED ORDER — IOHEXOL 350 MG/ML SOLN
100.0000 mL | Freq: Once | INTRAVENOUS | Status: AC | PRN
Start: 2015-02-16 — End: 2015-02-16
  Administered 2015-02-16: 100 mL via INTRAVENOUS

## 2015-02-16 NOTE — Telephone Encounter (Signed)
See result note.  

## 2015-02-16 NOTE — Telephone Encounter (Signed)
Olivia Mackie from [facility] radiology called.  Results of test: no noted demonstratable Pulmonary Emboli.  Would like a follow up non contrast Chest ct in 3 months. Notes in Epic available for review.

## 2015-02-17 ENCOUNTER — Telehealth: Payer: Self-pay

## 2015-02-17 NOTE — Telephone Encounter (Signed)
lmtcb X1 to schedule appt in BT office

## 2015-02-17 NOTE — Telephone Encounter (Signed)
-----   Message from Juanito Doom, MD sent at 02/16/2015  8:40 AM EDT ----- Regarding: RE: question Hi Charlene,  It sounds like we need to see her again.  I will cc Caryl Pina who works with me to see if she can help get her an appointment in [place] with one of our providers up there.  Bridget Briggs ----- Message -----    From: Einar Pheasant, MD    Sent: 02/15/2015   8:18 PM      To: Juanito Doom, MD Subject: question                                       Bridget Briggs is a patient you have seen previously.  She has been diagnosed with bronchiectasis.  She has been having persistent chest pressure.  CXR revealed concerns over pneumonia.  Has had levaquin.  She does feel better, but the chest pressure is persistent - has even woke her from sleep.  She still does not feel well.   I have extended out the levaquin.  I have ordered a CT scan.  EKG unrevealing.  Do you have any other suggestions.  I may need to get her back in to pulmonary for evaluation.    Thank you for your help.   Einar Pheasant

## 2015-02-17 NOTE — Telephone Encounter (Signed)
Made her an appt on 10/13 with [facility]

## 2015-02-18 ENCOUNTER — Other Ambulatory Visit: Payer: Self-pay | Admitting: Internal Medicine

## 2015-02-18 DIAGNOSIS — R0789 Other chest pain: Secondary | ICD-10-CM

## 2015-02-18 DIAGNOSIS — R9389 Abnormal findings on diagnostic imaging of other specified body structures: Secondary | ICD-10-CM

## 2015-02-18 NOTE — Progress Notes (Signed)
Order placed for pulmonary referral.  

## 2015-03-02 ENCOUNTER — Encounter: Payer: Self-pay | Admitting: Internal Medicine

## 2015-03-02 ENCOUNTER — Ambulatory Visit (INDEPENDENT_AMBULATORY_CARE_PROVIDER_SITE_OTHER): Payer: Medicare Other | Admitting: Internal Medicine

## 2015-03-02 VITALS — BP 120/72 | HR 74 | Ht 64.0 in | Wt 183.0 lb

## 2015-03-02 DIAGNOSIS — J471 Bronchiectasis with (acute) exacerbation: Secondary | ICD-10-CM | POA: Diagnosis not present

## 2015-03-02 DIAGNOSIS — Z23 Encounter for immunization: Secondary | ICD-10-CM | POA: Diagnosis not present

## 2015-03-02 NOTE — Progress Notes (Signed)
Subjective:    Patient ID: Bridget Briggs, female    DOB: January 10, 1931, 81 y.o.   MRN: [id number]  Synopsis: Referred in 2015 for evaluation of a pulmonary nodule. Found to have lingular and right middle lobe bronchiectasis which she attributes to her prior episode of pneumonia.  Repeat CT chest doing in February 2016 did not show changes in the small nodules which could represent indolent mycobacterial disease. CT chest 02/13/15 shows new GGO RML-likely related to recent pneumonia  HPI  Chief Complaint  Patient presents with  . Follow-up    prev BQ pt; chest pressure; cough at times; finished abx;   Patient here for follow up for therapy for pneumonia Patient had been on ABX for last several weeks, patient had fevers, chest congestion and CT scan Patient has been seen previously by Dr Sherrin Daisy for Bronchiectasis  AT this time, she has no fevers, chills. No signs or symptoms of pneumonia at this time She has no SOB,DOE,  Patient has had some chest pressure and has seen Dr. Shary Key underwent stress test ECHO  Past Medical History  Diagnosis Date  . GERD (gastroesophageal reflux disease)   . Arthritis   . Depression   . History of chicken pox   . Glaucoma   . Hx: UTI (urinary tract infection)   . History of colon polyps         Review of Systems  Constitutional: Negative for fever and unexpected weight change.  HENT: Negative for congestion, dental problem, ear pain, nosebleeds, postnasal drip, rhinorrhea, sinus pressure, sneezing, sore throat and trouble swallowing.   Eyes: Negative for redness and itching.  Respiratory: Negative for cough, chest tightness, shortness of breath and wheezing.   Cardiovascular: Negative for palpitations and leg swelling.       Objective:   Physical Exam Filed Vitals:   03/02/15 1127  BP: 120/72  Pulse: 74  Height: 5\' 4"  (1.626 m)  Weight: 183 lb (83.008 kg)  SpO2: 95%  RA  Gen: well appearing, no acute distress HEENT:  NCAT, EOMi, OP clear, neck supple without masses PULM: CTA B CV: RRR, slight systolic murmur, no JVD AB: BS+, soft, nontender Ext: warm, no edema, no clubbing, no cyanosis Derm: no rash or skin breakdown Neuro: A&Ox4, MAEW   11/15/2013 CT chest> mild centrilobular emphysema bilaterally, there is mild lingular bronchiectasis as well as right middle lobe bronchiectasis., There is also subpleural nodularity in the right lower lobe which is worrisome for Mycobacterium avium complex infection. February 2016 CT chest images reviewed today in clinic> no significant change in the right middle lobe nodules, and fact they're slightly smaller, right middle lobe and lingular bronchiectasis again noted. Mild centrilobular emphysema.     Assessment & Plan:  81 yo white female with h/o Bronchiectasis with recent bout of pneumonia   Bronchiectasis with acute exacerbation-resolved -no need for abx at this time,completed Levaquin several days ago -she has no symptoms of productive cough-no need for abx or flutter valve at this time   Multiple pulmonary nodules -likely related to infectious etiology-Ct chest reviewed with Patient  If Patient were to have another pneumonia, I would recommend Bronchoscopy for airway examination and BAL sampling   I have personally obtained a history, examined the patient, evaluated Pertinent laboratory and RadioGraphic/imaging results, and  formulated the assessment and plan The Patient requires high complexity decision making for assessment and support, frequent evaluation and titration of therapies.   Patient satisfied with Plan of action and management. All  questions answered  Corrin Parker, M.D.  [facility]  Medical Director [place] Director [facility] Cardio-Pulmonary Department

## 2015-03-02 NOTE — Patient Instructions (Signed)
Bronchiectasis Bronchiectasis is a condition in which the airways (bronchi) are damaged and widened. This makes it difficult for the lungs to get rid of mucus. As a result, mucus gathers in the airways, and this often leads to lung infections. Infection can cause inflammation in the airways, which may further weaken and damage the bronchi.  CAUSES  Bronchiectasis may be present at birth (congenital) or may develop later in life. Sometimes there is no apparent cause. Some common causes include:  Cystic fibrosis.   Recurrent lung infections (such as pneumonia, tuberculosis, or fungal infections).  Foreign bodies or other blockages in the lungs.  Breathing in fluid, food, or other foreign objects (aspiration). SIGNS AND SYMPTOMS  Common symptoms include:  A daily cough that brings up mucus and lasts for more than 3 weeks.  Frequent lung infections (such as pneumonia, tuberculosis, or fungal infections).  Shortness of breath and wheezing.   Weakness and fatigue. DIAGNOSIS  Various tests may be done to help diagnose bronchiectasis. Tests may include:  Chest X-rays or CT scans.   Breathing tests to help determine how your lungs are working.   Sputum cultures to check for infection.   Blood tests and other tests to check for related diseases or causes, such as cystic fibrosis. TREATMENT  Treatment varies depending on the severity of the condition. Medicines may be given to loosen the mucus to be coughed up (expectorants), to relax the muscles of the air passages (bronchodilators), or to prevent or treat infections (antibiotics). Physical therapy methods may be recommended to help clear mucus from the lungs. For severe cases, surgery may be done to remove the affected part of the lung. HOME CARE INSTRUCTIONS   Get plenty of rest.   Only take over-the-counter or prescription medicines as directed by your health care provider. If antibiotic medicines were prescribed, take them as  directed. Finish them even if you start to feel better.  Avoid sedatives and antihistamines unless otherwise directed by your health care provider. These medicines tend to thicken the mucus in the lungs.   Perform any breathing exercises or techniques to clear the lungs as directed by your health care provider.  Drink enough fluids to keep your urine clear or pale yellow.  Consider using a cold steam vaporizer or humidifier in your room or home to help loosen secretions.   If the cough is worse at night, try sleeping in a semi-upright position in a recliner or using a couple of pillows.   Avoid cigarette smoke and lung irritants. If you smoke, quit.  Stay inside when pollution and ozone levels are high.   Stay current with vaccinations and immunizations.   Follow up with your health care provider as directed.  SEEK MEDICAL CARE IF:  You cough up more thick, discolored mucus (sputum) that is yellow to green in color.  You have a fever or persistent symptoms for more than 2-3 days.  You cannot control your cough and are losing sleep. SEEK IMMEDIATE MEDICAL CARE IF:   You cough up blood.   You have chest pain or increasing shortness of breath.   You have pain that is getting worse or is uncontrolled with medicines.   You have a fever and your symptoms suddenly get worse. MAKE SURE YOU:  Understand these instructions.   Will watch your condition.   Will get help right away if you are not doing well or get worse.    This information is not intended to replace advice given to   you by your health care provider. Make sure you discuss any questions you have with your health care provider.   Document Released: 03/03/2007 Document Revised: 05/11/2013 Document Reviewed: 11/11/2012 Elsevier Interactive Patient Education 2016 [employer]  

## 2015-03-23 ENCOUNTER — Telehealth: Payer: Self-pay | Admitting: Internal Medicine

## 2015-03-23 NOTE — Telephone Encounter (Signed)
Left msg for pt to call office to schedule AWV.msn °

## 2015-04-05 ENCOUNTER — Other Ambulatory Visit: Payer: Self-pay | Admitting: Internal Medicine

## 2015-04-05 ENCOUNTER — Ambulatory Visit
Admission: RE | Admit: 2015-04-05 | Discharge: 2015-04-05 | Disposition: A | Discharge status: Home / Self Care | Payer: Medicare Other | Source: Ambulatory Visit | Attending: Internal Medicine | Admitting: Internal Medicine

## 2015-04-05 ENCOUNTER — Encounter: Payer: Self-pay | Admitting: Internal Medicine

## 2015-04-05 ENCOUNTER — Ambulatory Visit (INDEPENDENT_AMBULATORY_CARE_PROVIDER_SITE_OTHER): Payer: Medicare Other | Admitting: Internal Medicine

## 2015-04-05 VITALS — BP 110/64 | HR 63 | Temp 98.1°F | Resp 18 | Ht 64.0 in | Wt 185.5 lb

## 2015-04-05 DIAGNOSIS — K219 Gastro-esophageal reflux disease without esophagitis: Secondary | ICD-10-CM | POA: Diagnosis not present

## 2015-04-05 DIAGNOSIS — R079 Chest pain, unspecified: Secondary | ICD-10-CM

## 2015-04-05 DIAGNOSIS — R918 Other nonspecific abnormal finding of lung field: Secondary | ICD-10-CM

## 2015-04-05 DIAGNOSIS — M25552 Pain in left hip: Secondary | ICD-10-CM | POA: Insufficient documentation

## 2015-04-05 DIAGNOSIS — F329 Major depressive disorder, single episode, unspecified: Secondary | ICD-10-CM

## 2015-04-05 DIAGNOSIS — F32A Depression, unspecified: Secondary | ICD-10-CM

## 2015-04-05 DIAGNOSIS — Z86010 Personal history of colonic polyps: Secondary | ICD-10-CM

## 2015-04-05 DIAGNOSIS — M1612 Unilateral primary osteoarthritis, left hip: Secondary | ICD-10-CM | POA: Diagnosis not present

## 2015-04-05 DIAGNOSIS — M810 Age-related osteoporosis without current pathological fracture: Secondary | ICD-10-CM

## 2015-04-05 NOTE — Progress Notes (Signed)
Pre-visit discussion using our clinic review tool. No additional management support is needed unless otherwise documented below in the visit note.  

## 2015-04-05 NOTE — Progress Notes (Signed)
Patient ID: Bridget Briggs, female   DOB: [birth year], 81 y.o.   MRN: [id number]   Subjective:    Patient ID: Bridget Briggs, female    DOB: December 05, 1930, 81 y.o.   MRN: [id number]  HPI  Patient with past history of GERD, depression and documented arthritis.  She comes in today for a scheduled follow up.  She was recently evaluated by Dr Clayborn Bigness.  ECHO and Lexiscan - ok.  She feels her breathing is overall stable.  No increased cough or congestion.  No acid reflux.  No nausea or vomiting.  Bowels stable.  She does report left hip pain.  Hurts if she lies on that side.  Hurts to walk.  No back pain.  Has been present for one month.    Past Medical History  Diagnosis Date  . GERD (gastroesophageal reflux disease)   . Arthritis   . Depression   . History of chicken pox   . Glaucoma   . Hx: UTI (urinary tract infection)   . History of colon polyps    Past Surgical History  Procedure Laterality Date  . Cholecystectomy  1996  . Appendectomy  1974  . Tonsillectomy and adenoidectomy  1940  . Breast surgery Right 1988    biopsy  . Abdominal hysterectomy  1974    left ovary removed  . Ankle surgery Left 1998  . Nissen fundoplication  99991111  . Eye surgery Bilateral 2005/2006    cataract  . Laparoscopic gastric banding with hiatal hernia repair  2000   Family History  Problem Relation Age of Onset  . Arthritis Mother   . Leukemia Mother   . Lung cancer Father   . Osteoporosis Other    Social History   Social History  . Marital Status: Married    Spouse Name: N/A  . Number of Children: N/A  . Years of Education: N/A   Social History Main Topics  . Smoking status: Former Smoker -- 1.00 packs/day for 30 years    Types: Cigarettes    Quit date: 05/20/1988  . Smokeless tobacco: Never Used  . Alcohol Use: No  . Drug Use: No  . Sexual Activity: Not Asked   Other Topics Concern  . None   Social History Narrative    Outpatient Encounter Prescriptions as of 04/05/2015    Medication Sig  . beta carotene w/minerals (OCUVITE) tablet Take 1 tablet by mouth daily.  . calcium-vitamin D (OSCAL WITH D) 500-200 MG-UNIT per tablet Take 1 tablet by mouth daily.  . diphenhydramine-acetaminophen (TYLENOL PM) 25-500 MG TABS Take 1 tablet by mouth at bedtime as needed.  . dorzolamide-timolol (COSOPT) 22.3-6.8 MG/ML ophthalmic solution Place 1 drop into both eyes 2 (two) times daily.  . fluocinolone (SYNALAR) 0.01 % external solution Apply topically 2 (two) times daily as needed.  . latanoprost (XALATAN) 0.005 % ophthalmic solution Place 1 drop into both eyes at bedtime.  . vitamin D, CHOLECALCIFEROL, 400 UNITS tablet Take 400 Units by mouth daily.  . [DISCONTINUED] benzonatate (TESSALON) 100 MG capsule TAKE 1 CAPSULE BY MOUTH 3 TIMES A DAY AS NEEDED *NOT COVER*   No facility-administered encounter medications on file as of 04/05/2015.    Review of Systems  Constitutional: Negative for appetite change and unexpected weight change.  HENT: Negative for congestion and sinus pressure.   Respiratory: Negative for cough and chest tightness.        Breathing stable.   Cardiovascular: Negative for chest pain, palpitations and leg swelling.  Gastrointestinal: Negative for nausea, vomiting, abdominal pain and diarrhea.  Genitourinary: Negative for dysuria and difficulty urinating.  Musculoskeletal:       Left hip pain.  Hurts to walk.  Hurts to lie on that side.   Skin: Negative for color change and rash.  Neurological: Negative for dizziness, light-headedness and headaches.  Psychiatric/Behavioral: Negative for dysphoric mood and agitation.       Objective:    Physical Exam  Constitutional: She appears well-developed and well-nourished. No distress.  HENT:  Nose: Nose normal.  Mouth/Throat: Oropharynx is clear and moist.  Eyes: Conjunctivae are normal. Right eye exhibits no discharge. Left eye exhibits no discharge.  Neck: Neck supple. No thyromegaly present.   Cardiovascular: Normal rate and regular rhythm.   Pulmonary/Chest: Breath sounds normal. No respiratory distress. She has no wheezes.  Abdominal: Soft. Bowel sounds are normal. There is no tenderness.  Musculoskeletal: She exhibits no edema or tenderness.  Some increased pain/pulling sensation with resistance against full extension.  Increased pain with weight beating.   Lymphadenopathy:    She has no cervical adenopathy.  Skin: No rash noted. No erythema.  Psychiatric: She has a normal mood and affect. Her behavior is normal.    BP 110/64 mmHg  Pulse 63  Temp(Src) 98.1 F (36.7 C) (Oral)  Resp 18  Ht 5\' 4"  (1.626 m)  Wt 185 lb 8 oz (84.142 kg)  BMI 31.83 kg/m2  SpO2 96% Wt Readings from Last 3 Encounters:  04/05/15 185 lb 8 oz (84.142 kg)  03/02/15 183 lb (83.008 kg)  02/15/15 184 lb 4 oz (83.575 kg)     Lab Results  Component Value Date   WBC 10.3 02/15/2015   HGB 14.4 02/15/2015   HCT 44.1 02/15/2015   PLT 289.0 02/15/2015   GLUCOSE 82 02/15/2015   CHOL 185 09/14/2014   TRIG 123.0 09/14/2014   HDL 62.00 09/14/2014   LDLCALC 98 09/14/2014   ALT 17 09/14/2014   AST 19 09/14/2014   NA 137 02/15/2015   K 4.8 02/15/2015   CL 101 02/15/2015   CREATININE 0.78 02/15/2015   BUN 12 02/15/2015   CO2 31 02/15/2015   TSH 4.43 09/14/2014    Ct Angio Chest W/cm &/or Wo Cm  02/16/2015  CLINICAL DATA:  Chest pain/ pressure with cough and congestion EXAM: CT ANGIOGRAPHY CHEST WITH CONTRAST TECHNIQUE: Multidetector CT imaging of the chest was performed using the standard protocol during bolus administration of intravenous contrast. Multiplanar CT image reconstructions and MIPs were obtained to evaluate the vascular anatomy. CONTRAST:  137mL OMNIPAQUE IOHEXOL 350 MG/ML SOLN COMPARISON:  Chest CT June 20, 2014; chest radiograph February 06, 2015 ; chest CT November 15, 2013 FINDINGS: There is no demonstrable pulmonary embolus. There is atherosclerotic change in the thoracic aorta  but no aneurysm or dissection in the thoracic region. There is atherosclerotic change at the origins of the great vessels without high-grade obstruction. In comparison with the prior CT examination, there are several 3-4 mm nodular opacities in the lateral segment right middle lobe. There is atelectatic change in the medial segment right middle lobe. On axial slice 88 series 6, there is a new 7 x 5 mm nodular opacity in the medial segment right middle lobe. There is also atelectatic change in the anterior left base which appears stable. There is associated bronchiectatic change in this area. Mild bronchiolectasis in both lower lobes is a stable finding. There is no frank airspace consolidation apparent currently. There is mild scarring in  each apex. Thyroid appears normal. No adenopathy is appreciable. The pericardium is not thickened. There are scattered foci of coronary artery calcification. In the visualized upper abdomen, there is a stable mass in the dome of the liver which shows peripheral enhancement consistent with a cavernous hemangioma. This lesion measures 3.7 x 2.7 cm, stable. Small apparent cysts in the left and right lobes of the liver also are stable and have a benign appearance. No new liver lesions are identified in the visualized portions of the liver. There is a cyst arising from the upper pole of the left kidney measuring 2.1 x 1.8 cm. Adrenals appear stable and within normal limits bilaterally. There is atherosclerotic change in the visualized upper abdominal aorta. There are no blastic or lytic bone lesions. Review of the MIP images confirms the above findings. IMPRESSION: No demonstrable pulmonary embolus. New 7 x 5 mm nodular opacity in the medial segment right middle lobe. Given this change compared to prior study, a followup noncontrast enhanced chest CT in 3 months is advised to further assess. Other nodular opacities appear stable. There are patchy areas of atelectasis and probable  peripheral mucoid impaction. Localized bronchiectasis in the inferior lingula is stable. Milder bronchiolectasis in both lower lobes is stable. Suspect underlying chronic infection such as Mycobacterium avium intracellulare as well as discussed on the most recent prior CT report. No new edema or consolidation present. No appreciable adenopathy. Apparent hemangioma dome of liver, stable. Areas of atherosclerotic calcification. Areas of coronary artery calcification noted. Electronically Signed   By: Lowella Grip III M.D.   On: 02/16/2015 11:17       Assessment & Plan:   Problem List Items Addressed This Visit    Chest pain    Was recently evaluated by Dr Clayborn Bigness.  ECHO and Lexiscan ok.  CT chest as outlined.  Pain stable.  Discussed further w/up.  She declines.  Will monitor.        Depression    Doing well.  Follow.        GERD (gastroesophageal reflux disease)    No upper symptoms reported.        History of colonic polyps    Last colonoscopy 2011.  One polyp removed.  Followed by Dr Tiffany Kocher.      Left hip pain - Primary    Pain as outlined.  Hurts with weight bearing.  No pain in back.  Tylenol as directed.  Check xray.        Relevant Orders   DG HIP UNILAT WITH PELVIS 2-3 VIEWS LEFT (Completed)   Multiple pulmonary nodules    Recent CT scan as outlined.  Saw pulmonary.  See their note.  Felt no further imaging warranted at this time.  Follow.  Breathing stable.       Osteoporosis    Bone density 06/23/14 - osteoporosis.  Received reclast.           Einar Pheasant, MD

## 2015-04-07 ENCOUNTER — Other Ambulatory Visit: Payer: Self-pay | Admitting: Internal Medicine

## 2015-04-07 DIAGNOSIS — M25552 Pain in left hip: Secondary | ICD-10-CM

## 2015-04-07 NOTE — Progress Notes (Signed)
Order placed for physical therapy.  

## 2015-04-10 ENCOUNTER — Encounter: Payer: Self-pay | Admitting: Internal Medicine

## 2015-04-10 NOTE — Assessment & Plan Note (Signed)
Last colonoscopy 2011.  One polyp removed.  Followed by Dr Elliot.   

## 2015-04-10 NOTE — Assessment & Plan Note (Signed)
Was recently evaluated by Dr Clayborn Bigness.  ECHO and Lexiscan ok.  CT chest as outlined.  Pain stable.  Discussed further w/up.  She declines.  Will monitor.

## 2015-04-10 NOTE — Assessment & Plan Note (Signed)
Doing well.  Follow.  

## 2015-04-10 NOTE — Assessment & Plan Note (Signed)
Recent CT scan as outlined.  Saw pulmonary.  See their note.  Felt no further imaging warranted at this time.  Follow.  Breathing stable.

## 2015-04-10 NOTE — Assessment & Plan Note (Signed)
Bone density 06/23/14 - osteoporosis.  Received reclast.

## 2015-04-10 NOTE — Assessment & Plan Note (Signed)
Pain as outlined.  Hurts with weight bearing.  No pain in back.  Tylenol as directed.  Check xray.

## 2015-04-10 NOTE — Assessment & Plan Note (Signed)
No upper symptoms reported.   

## 2015-04-20 ENCOUNTER — Ambulatory Visit: Payer: Medicare Other | Attending: Internal Medicine | Admitting: Physical Therapy

## 2015-04-20 DIAGNOSIS — M25552 Pain in left hip: Secondary | ICD-10-CM | POA: Diagnosis present

## 2015-04-20 DIAGNOSIS — M6289 Other specified disorders of muscle: Secondary | ICD-10-CM | POA: Insufficient documentation

## 2015-04-20 NOTE — Therapy (Signed)
[facility] [address] [place], [address] Phone: [phone]   Fax:  [phone]  Physical Therapy Evaluation  Patient Details  Name: Bridget Briggs MRN: [id number] Date of Birth: 11/27/1930 Referring Provider: Nicki Reaper  Encounter Date: 04/20/2015      PT End of Session - 04/20/15 1111    Visit Number 1   Number of Visits 9   Date for PT Re-Evaluation 05/18/15   PT Start Time 1020   PT Stop Time 1100   PT Time Calculation (min) 40 min   Activity Tolerance Patient tolerated treatment well;No increased pain   Behavior During Therapy [facility] for tasks assessed/performed      Past Medical History  Diagnosis Date  . GERD (gastroesophageal reflux disease)   . Arthritis   . Depression   . History of chicken pox   . Glaucoma   . Hx: UTI (urinary tract infection)   . History of colon polyps     Past Surgical History  Procedure Laterality Date  . Cholecystectomy  1996  . Appendectomy  1974  . Tonsillectomy and adenoidectomy  1940  . Breast surgery Right 1988    biopsy  . Abdominal hysterectomy  1974    left ovary removed  . Ankle surgery Left 1998  . Nissen fundoplication  99991111  . Eye surgery Bilateral 2005/2006    cataract  . Laparoscopic gastric banding with hiatal hernia repair  2000    There were no vitals filed for this visit.  Visit Diagnosis:  Hip joint pain, left - Plan: PT plan of care cert/re-cert      Subjective Assessment - 04/20/15 1058    Subjective Patient reports left hip pain when she is sleeping. She does not typically have pain otherwise. Patient has tenderness with palpation over proximal femur on the left.    Limitations Sitting;Other (comment)  lying on left side   How long can you walk comfortably? 20 min   Diagnostic tests xrays taken which per pt show arthritis   Patient Stated Goals to have less pain when trying to sleep on left side.    Currently in Pain? No/denies    Multiple Pain Sites No            [facility] PT Assessment - 04/20/15 0001    Assessment   Medical Diagnosis left hip pain   Referring Provider Scott   Onset Date/Surgical Date 11/18/14   Prior Therapy no   Precautions   Precautions None   Restrictions   Weight Bearing Restrictions No   Balance Screen   Has the patient fallen in the past 6 months No   Has the patient had a decrease in activity level because of a fear of falling?  No   Is the patient reluctant to leave their home because of a fear of falling?  No   Home Environment   Living Environment Private residence   Living Arrangements Alone   Type of Home Apartment   Home Access Level entry   Home Equipment Grab bars - toilet;Grab bars - tub/shower   Prior Function   Level of Independence Independent   Vocation Retired   Leisure exercises at the [employer]   Overall Cognitive Status Within Functional Limits for tasks assessed   Attention Focused   Observation/Other Assessments   Lower Extremity Functional Scale  69/80   Sensation   Light Touch Appears Intact   ROM / Strength  AROM / PROM / Strength AROM;Strength   AROM   Overall AROM  Within functional limits for tasks performed   Strength   Overall Strength Within functional limits for tasks performed   Bed Mobility   Bed Mobility --  independent   Ambulation/Gait   Ambulation Distance (Feet) 200 Feet         Treatment to include: moist heat to left hip x 7 min for pain relief.  Clam shells 2x10 reps for hip strengthening, cues provided.  Manual therapy to left hip over proximal femur for muscle tension and pain control.            PT Education - 04/20/15 1111    Education provided Yes   Education Details POC, try heat and tylenol at night for pain relief.   Person(s) Educated Patient   Methods Explanation   Comprehension Verbalized understanding             PT Long Term Goals - 04/20/15 1114    PT LONG TERM GOAL #1   Title  Patient will report decreased pain when lying on left side to < 2/10   Baseline 4/10   Time 4   Period Weeks   Status New   PT LONG TERM GOAL #2   Title Patient will demonstrate improved functional tolerance and activity with improved LEFS of 72/80   Baseline 69/80   Time 4   Period Weeks   Status New               Plan - 04/20/15 1112    Clinical Impression Statement Patient is an 81 year old female who reports left hip pain with lying on left side and with palpation over proximal femur. Patient does not have pain when performing daily activities.    Pt will benefit from skilled therapeutic intervention in order to improve on the following deficits Pain;Decreased strength   Rehab Potential Fair   PT Frequency 2x / week   PT Duration 4 weeks   PT Treatment/Interventions Therapeutic exercise;Manual techniques;Moist Heat;Ultrasound   PT Home Exercise Plan continue with exercises at the [facility] as usual and walking.   Consulted and Agree with Plan of Care Patient         Problem List Patient Active Problem List   Diagnosis Date Noted  . Left hip pain 04/05/2015  . Chest pressure 02/15/2015  . Cough 02/07/2015  . Gas 12/04/2014  . Pain of left thumb 08/06/2014  . Health care maintenance 08/06/2014  . Osteoporosis 07/28/2014  . Bronchiectasis without acute exacerbation ([place]) 01/20/2014  . Multiple pulmonary nodules 01/20/2014  . Abnormal CXR 11/02/2013  . Chest pain 11/01/2013  . Fatigue 11/01/2013  . Burning sensation in lower extremity 11/01/2013  . Environmental allergies 04/19/2013  . Arthritis 01/18/2013  . Depression 01/18/2013  . GERD (gastroesophageal reflux disease) 01/18/2013  . Glaucoma 01/18/2013  . History of colonic polyps 01/18/2013  . Carotid stenosis 01/18/2013    Staci Carver, PT, MPT, GCS 04/20/2015, 11:18 AM  [facility] [address] [place], [address] Phone:  [phone]   Fax:  [phone]  Name: RAHEEMA JAQUEZ MRN: [id number] Date of Birth: 79-26-1932

## 2015-04-24 ENCOUNTER — Ambulatory Visit: Payer: Medicare Other | Admitting: Physical Therapy

## 2015-04-24 DIAGNOSIS — M25552 Pain in left hip: Secondary | ICD-10-CM | POA: Diagnosis not present

## 2015-04-24 NOTE — Therapy (Signed)
[facility] [address], [place], [address] Phone: [phone]   Fax:  [phone]  Physical Therapy Treatment  Patient Details  Name: Bridget Briggs MRN: [id number] Date of Birth: 26-Dec-1930 Referring Provider: Nicki Reaper  Encounter Date: 04/24/2015      PT End of Session - 04/24/15 1138    Visit Number 2   Number of Visits 9   Date for PT Re-Evaluation 05/18/15   PT Start Time 1100   PT Stop Time 1135   PT Time Calculation (min) 35 min   Activity Tolerance Patient tolerated treatment well   Behavior During Therapy [facility] for tasks assessed/performed      Past Medical History  Diagnosis Date  . GERD (gastroesophageal reflux disease)   . Arthritis   . Depression   . History of chicken pox   . Glaucoma   . Hx: UTI (urinary tract infection)   . History of colon polyps     Past Surgical History  Procedure Laterality Date  . Cholecystectomy  1996  . Appendectomy  1974  . Tonsillectomy and adenoidectomy  1940  . Breast surgery Right 1988    biopsy  . Abdominal hysterectomy  1974    left ovary removed  . Ankle surgery Left 1998  . Nissen fundoplication  99991111  . Eye surgery Bilateral 2005/2006    cataract  . Laparoscopic gastric banding with hiatal hernia repair  2000    There were no vitals filed for this visit.  Visit Diagnosis:  Hip joint pain, left      Subjective Assessment - 04/24/15 1136    Subjective Patient reports her left hip pain has improved since evaluation. Pt reports she is able to sleep better.    Limitations Sitting;Other (comment)   How long can you walk comfortably? 20 min   Diagnostic tests xrays taken which per pt show arthritis   Patient Stated Goals to have less pain when trying to sleep on left side.    Currently in Pain? No/denies   Multiple Pain Sites No           Clam shells 2x10 prone hip extension 2x10, standing hip extension using machine with 55# 2x10  reps for hip/glute strengthening, cues provided.  Manual therapy to left hip over proximal femur and IT band also using "the stick" roller for muscle tension and pain control.            PT Education - 04/24/15 1137    Education provided Yes   Education Details new exercises for glute strengthening.   Person(s) Educated Patient   Methods Explanation;Demonstration   Comprehension Verbalized understanding;Returned demonstration             PT Long Term Goals - 04/20/15 1114    PT LONG TERM GOAL #1   Title Patient will report decreased pain when lying on left side to < 2/10   Baseline 4/10   Time 4   Period Weeks   Status New   PT LONG TERM GOAL #2   Title Patient will demonstrate improved functional tolerance and activity with improved LEFS of 72/80   Baseline 69/80   Time 4   Period Weeks   Status New               Plan - 04/24/15 1138    Clinical Impression Statement Patient reports good results from soft tissue work and exercises from last session. Patient continues to have left hip/IT  band sensitivity with manual work.    Pt will benefit from skilled therapeutic intervention in order to improve on the following deficits Pain;Decreased strength   Rehab Potential Fair   PT Frequency 2x / week   PT Duration 4 weeks   PT Treatment/Interventions Therapeutic exercise;Manual techniques;Moist Heat;Ultrasound   PT Home Exercise Plan continue with exercises at the [facility] as usual and walking.   Consulted and Agree with Plan of Care Patient        Problem List Patient Active Problem List   Diagnosis Date Noted  . Left hip pain 04/05/2015  . Chest pressure 02/15/2015  . Cough 02/07/2015  . Gas 12/04/2014  . Pain of left thumb 08/06/2014  . Health care maintenance 08/06/2014  . Osteoporosis 07/28/2014  . Bronchiectasis without acute exacerbation ([place]) 01/20/2014  . Multiple pulmonary nodules 01/20/2014  . Abnormal CXR 11/02/2013  . Chest pain 11/01/2013   . Fatigue 11/01/2013  . Burning sensation in lower extremity 11/01/2013  . Environmental allergies 04/19/2013  . Arthritis 01/18/2013  . Depression 01/18/2013  . GERD (gastroesophageal reflux disease) 01/18/2013  . Glaucoma 01/18/2013  . History of colonic polyps 01/18/2013  . Carotid stenosis 01/18/2013    Shields Pautz, PT, MPT, GCS 04/24/2015, 11:41 AM  [facility] [address], [place], [address] Phone: [phone]   Fax:  [phone]  Name: Bridget Briggs MRN: [id number] Date of Birth: [birth year]

## 2015-04-26 ENCOUNTER — Ambulatory Visit (INDEPENDENT_AMBULATORY_CARE_PROVIDER_SITE_OTHER): Payer: Medicare Other

## 2015-04-26 VITALS — BP 122/72 | HR 74 | Temp 98.2°F | Resp 14 | Ht 64.0 in | Wt 188.8 lb

## 2015-04-26 DIAGNOSIS — Z0000 Encounter for general adult medical examination without abnormal findings: Secondary | ICD-10-CM

## 2015-04-26 NOTE — Progress Notes (Signed)
Subjective:   Bridget Briggs is a 81 y.o. female who presents for an Initial Medicare Annual Wellness Visit.  Review of Systems    No ROS.  Medicare Wellness Visit.   Cardiac Risk Factors include: advanced age (>51men, >18 women)     Objective:    Today's Vitals   04/26/15 1309  BP: 122/72  Pulse: 74  Temp: 98.2 F (36.8 C)  TempSrc: Oral  Resp: 14  Height: 5\' 4"  (1.626 m)  Weight: 188 lb 12.8 oz (85.639 kg)  SpO2: 96%    Current Medications (verified) Outpatient Encounter Prescriptions as of 04/26/2015  Medication Sig  . beta carotene w/minerals (OCUVITE) tablet Take 1 tablet by mouth daily.  . calcium-vitamin D (OSCAL WITH D) 500-200 MG-UNIT per tablet Take 1 tablet by mouth daily.  . diphenhydramine-acetaminophen (TYLENOL PM) 25-500 MG TABS Take 1 tablet by mouth at bedtime as needed.  . dorzolamide-timolol (COSOPT) 22.3-6.8 MG/ML ophthalmic solution Place 1 drop into both eyes 2 (two) times daily.  . fluocinolone (SYNALAR) 0.01 % external solution Apply topically 2 (two) times daily as needed.  . latanoprost (XALATAN) 0.005 % ophthalmic solution Place 1 drop into both eyes at bedtime.  . vitamin D, CHOLECALCIFEROL, 400 UNITS tablet Take 400 Units by mouth daily.   No facility-administered encounter medications on file as of 04/26/2015.    Allergies (verified) Review of patient's allergies indicates no known allergies.   History: Past Medical History  Diagnosis Date  . GERD (gastroesophageal reflux disease)   . Arthritis   . Depression   . History of chicken pox   . Glaucoma   . Hx: UTI (urinary tract infection)   . History of colon polyps    Past Surgical History  Procedure Laterality Date  . Cholecystectomy  1996  . Appendectomy  1974  . Tonsillectomy and adenoidectomy  1940  . Breast surgery Right 1988    biopsy  . Abdominal hysterectomy  1974    left ovary removed  . Ankle surgery Left 1998  . Nissen fundoplication  99991111  . Eye surgery  Bilateral 2005/2006    cataract  . Laparoscopic gastric banding with hiatal hernia repair  2000   Family History  Problem Relation Age of Onset  . Arthritis Mother   . Leukemia Mother   . Lung cancer Father   . Osteoporosis Other    Social History   Occupational History  . Not on file.   Social History Main Topics  . Smoking status: Former Smoker -- 1.00 packs/day for 30 years    Types: Cigarettes    Quit date: 05/20/1988  . Smokeless tobacco: Never Used  . Alcohol Use: No  . Drug Use: No  . Sexual Activity: No    Tobacco Counseling Counseling given: Not Answered   Activities of Daily Living In your present state of health, do you have any difficulty performing the following activities: 04/26/2015  Hearing? N  Vision? N  Difficulty concentrating or making decisions? N  Walking or climbing stairs? N  Dressing or bathing? N  Doing errands, shopping? N  Preparing Food and eating ? N  Using the Toilet? N  In the past six months, have you accidently leaked urine? N  Do you have problems with loss of bowel control? N  Managing your Medications? N  Managing your Finances? N  Housekeeping or managing your Housekeeping? N    Immunizations and Health Maintenance Immunization History  Administered Date(s) Administered  . Influenza Split 02/22/2013  .  Influenza,inj,Quad PF,36+ Mos 01/20/2014, 03/02/2015  . Pneumococcal Conjugate-13 01/20/2014   Health Maintenance Due  Topic Date Due  . TETANUS/TDAP  01/28/1952  . PNA vac Low Risk Adult (2 of 2 - PPSV23) 01/21/2015    Patient Care Team: Einar Pheasant, MD as PCP - General (Internal Medicine)  Indicate any recent Medical Services you may have received from other than [facility] providers in the past year (date may be approximate).     Assessment:   This is a routine wellness examination for Bridget Briggs. The goal of the wellness visit is to assist the patient how to close the gaps in care and create a preventative care plan  for the patient.   Calcium and Vit D as appropriate/ Osteoporosis risk reviewed.   Taking meds without issues; no barriers identified.  Safety issues reviewed; smoke detectors in the home. No firearms in the home. Wears seatbelts when driving or riding with others. No violence in the home.  No identified risk were noted; The patient was oriented x 3; appropriate in dress and manner and no objective failures at ADL's or IADL's.   Pneumococcal 23 and TDAP vaccinepostponed, per patient request.  Patient Concerns:None at this time.  Follow up with PCP as needed.  Hearing/Vision screen Hearing Screening Comments: Passes the whisper test Vision Screening Comments: Followed by Dr. Gloriann Loan Wears glasses 6 month visits  Dietary issues and exercise activities discussed: Current Exercise Habits:: Structured exercise class, Time (Minutes): 60, Frequency (Times/Week): 3, Weekly Exercise (Minutes/Week): 180, Intensity: Mild  Goals    . Reduce portion size     Patient centered goal is to cut meal portion sizes in half.      Depression Screen PHQ 2/9 Scores 04/26/2015 02/15/2015 04/29/2013  PHQ - 2 Score 0 0 0    Fall Risk Fall Risk  04/26/2015 02/15/2015 04/29/2013  Falls in the past year? No No No    Cognitive Function: MMSE - Mini Mental State Exam 04/26/2015  Orientation to time 5  Orientation to Place 5  Registration 3  Attention/ Calculation 5  Recall 3  Language- name 2 objects 2  Language- repeat 1  Language- follow 3 step command 3  Language- read & follow direction 1  Write a sentence 1  Copy design 1  Total score 30    Screening Tests Health Maintenance  Topic Date Due  . TETANUS/TDAP  01/28/1952  . PNA vac Low Risk Adult (2 of 2 - PPSV23) 01/21/2015  . MAMMOGRAM  06/24/2015  . INFLUENZA VACCINE  12/19/2015  . DEXA SCAN  Completed  . ZOSTAVAX  Addressed      Plan:   End of life planning was discussed; aging in home or other; Advanced directives; Copy  requested of current HCPOA/Living Will.   Return for scheduled appointment in February.  Follow up with PCP as needed.  During the course of the visit, Bridget Briggs was educated and counseled about the following appropriate screening and preventive services:   Vaccines to include Pneumoccal, Influenza, Hepatitis B, Td, Zostavax, HCV  Electrocardiogram  Cardiovascular disease screening  Colorectal cancer screening  Bone density screening  Diabetes screening  Glaucoma screening  Mammography/PAP  Nutrition counseling  Smoking cessation counseling  Patient Instructions (the written plan) were given to the patient.    Varney Biles, LPN   [id number]    Reviewed above information.  Agree with plan.   Dr Nicki Reaper

## 2015-04-26 NOTE — Patient Instructions (Addendum)
Bridget Briggs,  Thank you for taking time to come for your Medicare Wellness Visit.  I appreciate your ongoing commitment to your health goals. Please review the following plan we discussed and let me know if I can assist you in the future.  Return in February for complete physical with Dr. Nicki Reaper  Happy Holidays!   Health Maintenance, Female Adopting a healthy lifestyle and getting preventive care can go a long way to promote health and wellness. Talk with your health care provider about what schedule of regular examinations is right for you. This is a good chance for you to check in with your provider about disease prevention and staying healthy. In between checkups, there are plenty of things you can do on your own. Experts have done a lot of research about which lifestyle changes and preventive measures are most likely to keep you healthy. Ask your health care provider for more information. WEIGHT AND DIET  Eat a healthy diet  Be sure to include plenty of vegetables, fruits, low-fat dairy products, and lean protein.  Do not eat a lot of foods high in solid fats, added sugars, or salt.  Get regular exercise. This is one of the most important things you can do for your health.  Most adults should exercise for at least 150 minutes each week. The exercise should increase your heart rate and make you sweat (moderate-intensity exercise).  Most adults should also do strengthening exercises at least twice a week. This is in addition to the moderate-intensity exercise.  Maintain a healthy weight  Body mass index (BMI) is a measurement that can be used to identify possible weight problems. It estimates body fat based on height and weight. Your health care provider can help determine your BMI and help you achieve or maintain a healthy weight.  For females 76 years of age and older:   A BMI below 18.5 is considered underweight.  A BMI of 18.5 to 24.9 is normal.  A BMI of 25 to 29.9 is  considered overweight.  A BMI of 30 and above is considered obese.  Watch levels of cholesterol and blood lipids  You should start having your blood tested for lipids and cholesterol at 79 years of age, then have this test every 5 years.  You may need to have your cholesterol levels checked more often if:  Your lipid or cholesterol levels are high.  You are older than 79 years of age.  You are at high risk for heart disease.  CANCER SCREENING   Lung Cancer  Lung cancer screening is recommended for adults 15-16 years old who are at high risk for lung cancer because of a history of smoking.  A yearly low-dose CT scan of the lungs is recommended for people who:  Currently smoke.  Have quit within the past 15 years.  Have at least a 30-pack-year history of smoking. A pack year is smoking an average of one pack of cigarettes a day for 1 year.  Yearly screening should continue until it has been 15 years since you quit.  Yearly screening should stop if you develop a health problem that would prevent you from having lung cancer treatment.  Breast Cancer  Practice breast self-awareness. This means understanding how your breasts normally appear and feel.  It also means doing regular breast self-exams. Let your health care provider know about any changes, no matter how small.  If you are in your 20s or 30s, you should have a clinical breast exam (  CBE) by a health care provider every 1-3 years as part of a regular health exam.  If you are 48 or older, have a CBE every year. Also consider having a breast X-ray (mammogram) every year.  If you have a family history of breast cancer, talk to your health care provider about genetic screening.  If you are at high risk for breast cancer, talk to your health care provider about having an MRI and a mammogram every year.  Breast cancer gene (BRCA) assessment is recommended for women who have family members with BRCA-related cancers.  BRCA-related cancers include:  Breast.  Ovarian.  Tubal.  Peritoneal cancers.  Results of the assessment will determine the need for genetic counseling and BRCA1 and BRCA2 testing. Cervical Cancer Your health care provider may recommend that you be screened regularly for cancer of the pelvic organs (ovaries, uterus, and vagina). This screening involves a pelvic examination, including checking for microscopic changes to the surface of your cervix (Pap test). You may be encouraged to have this screening done every 3 years, beginning at age 31.  For women ages 63-65, health care providers may recommend pelvic exams and Pap testing every 3 years, or they may recommend the Pap and pelvic exam, combined with testing for human papilloma virus (HPV), every 5 years. Some types of HPV increase your risk of cervical cancer. Testing for HPV may also be done on women of any age with unclear Pap test results.  Other health care providers may not recommend any screening for nonpregnant women who are considered low risk for pelvic cancer and who do not have symptoms. Ask your health care provider if a screening pelvic exam is right for you.  If you have had past treatment for cervical cancer or a condition that could lead to cancer, you need Pap tests and screening for cancer for at least 20 years after your treatment. If Pap tests have been discontinued, your risk factors (such as having a new sexual partner) need to be reassessed to determine if screening should resume. Some women have medical problems that increase the chance of getting cervical cancer. In these cases, your health care provider may recommend more frequent screening and Pap tests. Colorectal Cancer  This type of cancer can be detected and often prevented.  Routine colorectal cancer screening usually begins at 79 years of age and continues through 79 years of age.  Your health care provider may recommend screening at an earlier age if you  have risk factors for colon cancer.  Your health care provider may also recommend using home test kits to check for hidden blood in the stool.  A small camera at the end of a tube can be used to examine your colon directly (sigmoidoscopy or colonoscopy). This is done to check for the earliest forms of colorectal cancer.  Routine screening usually begins at age 88.  Direct examination of the colon should be repeated every 5-10 years through 79 years of age. However, you may need to be screened more often if early forms of precancerous polyps or small growths are found. Skin Cancer  Check your skin from head to toe regularly.  Tell your health care provider about any new moles or changes in moles, especially if there is a change in a mole's shape or color.  Also tell your health care provider if you have a mole that is larger than the size of a pencil eraser.  Always use sunscreen. Apply sunscreen liberally and repeatedly throughout  the day.  Protect yourself by wearing long sleeves, pants, a wide-brimmed hat, and sunglasses whenever you are outside. HEART DISEASE, DIABETES, AND HIGH BLOOD PRESSURE   High blood pressure causes heart disease and increases the risk of stroke. High blood pressure is more likely to develop in:  People who have blood pressure in the high end of the normal range (130-139/85-89 mm Hg).  People who are overweight or obese.  People who are African American.  If you are 104-76 years of age, have your blood pressure checked every 3-5 years. If you are 61 years of age or older, have your blood pressure checked every year. You should have your blood pressure measured twice--once when you are at a hospital or clinic, and once when you are not at a hospital or clinic. Record the average of the two measurements. To check your blood pressure when you are not at a hospital or clinic, you can use:  An automated blood pressure machine at a pharmacy.  A home blood pressure  monitor.  If you are between 39 years and 63 years old, ask your health care provider if you should take aspirin to prevent strokes.  Have regular diabetes screenings. This involves taking a blood sample to check your fasting blood sugar level.  If you are at a normal weight and have a low risk for diabetes, have this test once every three years after 79 years of age.  If you are overweight and have a high risk for diabetes, consider being tested at a younger age or more often. PREVENTING INFECTION  Hepatitis B  If you have a higher risk for hepatitis B, you should be screened for this virus. You are considered at high risk for hepatitis B if:  You were born in a country where hepatitis B is common. Ask your health care provider which countries are considered high risk.  Your parents were born in a high-risk country, and you have not been immunized against hepatitis B (hepatitis B vaccine).  You have HIV or AIDS.  You use needles to inject street drugs.  You live with someone who has hepatitis B.  You have had sex with someone who has hepatitis B.  You get hemodialysis treatment.  You take certain medicines for conditions, including cancer, organ transplantation, and autoimmune conditions. Hepatitis C  Blood testing is recommended for:  Everyone born from [birth year] through 1963.  Anyone with known risk factors for hepatitis C. Sexually transmitted infections (STIs)  You should be screened for sexually transmitted infections (STIs) including gonorrhea and chlamydia if:  You are sexually active and are younger than 79 years of age.  You are older than 79 years of age and your health care provider tells you that you are at risk for this type of infection.  Your sexual activity has changed since you were last screened and you are at an increased risk for chlamydia or gonorrhea. Ask your health care provider if you are at risk.  If you do not have HIV, but are at risk, it may be  recommended that you take a prescription medicine daily to prevent HIV infection. This is called pre-exposure prophylaxis (PrEP). You are considered at risk if:  You are sexually active and do not regularly use condoms or know the HIV status of your partner(s).  You take drugs by injection.  You are sexually active with a partner who has HIV. Talk with your health care provider about whether you are at high risk  of being infected with HIV. If you choose to begin PrEP, you should first be tested for HIV. You should then be tested every 3 months for as long as you are taking PrEP.  PREGNANCY   If you are premenopausal and you may become pregnant, ask your health care provider about preconception counseling.  If you may become pregnant, take 400 to 800 micrograms (mcg) of folic acid every day.  If you want to prevent pregnancy, talk to your health care provider about birth control (contraception). OSTEOPOROSIS AND MENOPAUSE   Osteoporosis is a disease in which the bones lose minerals and strength with aging. This can result in serious bone fractures. Your risk for osteoporosis can be identified using a bone density scan.  If you are 51 years of age or older, or if you are at risk for osteoporosis and fractures, ask your health care provider if you should be screened.  Ask your health care provider whether you should take a calcium or vitamin D supplement to lower your risk for osteoporosis.  Menopause may have certain physical symptoms and risks.  Hormone replacement therapy may reduce some of these symptoms and risks. Talk to your health care provider about whether hormone replacement therapy is right for you.  HOME CARE INSTRUCTIONS   Schedule regular health, dental, and eye exams.  Stay current with your immunizations.   Do not use any tobacco products including cigarettes, chewing tobacco, or electronic cigarettes.  If you are pregnant, do not drink alcohol.  If you are  breastfeeding, limit how much and how often you drink alcohol.  Limit alcohol intake to no more than 1 drink per day for nonpregnant women. One drink equals 12 ounces of beer, 5 ounces of wine, or 1 ounces of hard liquor.  Do not use street drugs.  Do not share needles.  Ask your health care provider for help if you need support or information about quitting drugs.  Tell your health care provider if you often feel depressed.  Tell your health care provider if you have ever been abused or do not feel safe at home.   This information is not intended to replace advice given to you by your health care provider. Make sure you discuss any questions you have with your health care provider.   Document Released: 11/19/2010 Document Revised: 05/27/2014 Document Reviewed: 04/07/2013 Elsevier Interactive Patient Education [employer].

## 2015-04-27 ENCOUNTER — Ambulatory Visit: Payer: Medicare Other | Admitting: Physical Therapy

## 2015-04-27 DIAGNOSIS — M25552 Pain in left hip: Secondary | ICD-10-CM

## 2015-04-27 NOTE — Therapy (Signed)
[place] [facility] [address], [place], [address] Phone: [phone]   Fax:  [phone]  Physical Therapy Treatment  Patient Details  Name: Bridget Briggs MRN: [id number] Date of Birth: January 17, 1931 Referring Provider: Nicki Reaper  Encounter Date: 04/27/2015      PT End of Session - 04/27/15 1145    Visit Number 3   Number of Visits 9   Date for PT Re-Evaluation 05/18/15   PT Start Time 1055   PT Stop Time 1135   PT Time Calculation (min) 40 min   Activity Tolerance Patient tolerated treatment well;No increased pain   Behavior During Therapy [facility] for tasks assessed/performed      Past Medical History  Diagnosis Date  . GERD (gastroesophageal reflux disease)   . Arthritis   . Depression   . History of chicken pox   . Glaucoma   . Hx: UTI (urinary tract infection)   . History of colon polyps     Past Surgical History  Procedure Laterality Date  . Cholecystectomy  1996  . Appendectomy  1974  . Tonsillectomy and adenoidectomy  1940  . Breast surgery Right 1988    biopsy  . Abdominal hysterectomy  1974    left ovary removed  . Ankle surgery Left 1998  . Nissen fundoplication  99991111  . Eye surgery Bilateral 2005/2006    cataract  . Laparoscopic gastric banding with hiatal hernia repair  2000    There were no vitals filed for this visit.  Visit Diagnosis:  Hip joint pain, left      Subjective Assessment - 04/27/15 1144    Subjective Patient reports pain is some better, feels like she is sleeping a little better.    Limitations Sitting   How long can you walk comfortably? 20 min   Diagnostic tests xrays taken which per pt show arthritis   Patient Stated Goals to have less pain when trying to sleep on left side.    Currently in Pain? Yes   Pain Score 3    Pain Location Hip   Pain Orientation Left   Pain Descriptors / Indicators Sore   Pain Type Acute pain   Pain Frequency Intermittent   Multiple  Pain Sites No          Clam shells 2x10 prone hip extension 2x10, prone hip extension with knee bend 2x10 with min assist for proper muscle contraction and form, standing hip extension using machine with 55# 2x10 reps for hip/glute strengthening, cues provided.  Manual therapy to left hip over proximal femur and IT band also using "the stick" roller for muscle tension and pain control.          PT Education - 04/27/15 1145    Education provided Yes   Education Details exercises, proper form   Person(s) Educated Patient   Methods Explanation;Demonstration   Comprehension Verbalized understanding;Returned demonstration             PT Long Term Goals - 04/20/15 1114    PT LONG TERM GOAL #1   Title Patient will report decreased pain when lying on left side to < 2/10   Baseline 4/10   Time 4   Period Weeks   Status New   PT LONG TERM GOAL #2   Title Patient will demonstrate improved functional tolerance and activity with improved LEFS of 72/80   Baseline 69/80   Time 4   Period Weeks   Status New  Plan - 04/27/15 1146    Clinical Impression Statement Patient reports soreness with STM. Some pain with exercises as well. However seems to be improving with pain.    Pt will benefit from skilled therapeutic intervention in order to improve on the following deficits Pain;Decreased strength   Rehab Potential Fair   PT Frequency 2x / week   PT Duration 4 weeks   PT Treatment/Interventions Therapeutic exercise;Manual techniques;Moist Heat;Ultrasound   PT Home Exercise Plan continue with exercises at the [facility] as usual and walking.   Consulted and Agree with Plan of Care Patient        Problem List Patient Active Problem List   Diagnosis Date Noted  . Left hip pain 04/05/2015  . Chest pressure 02/15/2015  . Cough 02/07/2015  . Gas 12/04/2014  . Pain of left thumb 08/06/2014  . Health care maintenance 08/06/2014  . Osteoporosis 07/28/2014  .  Bronchiectasis without acute exacerbation ([place]) 01/20/2014  . Multiple pulmonary nodules 01/20/2014  . Abnormal CXR 11/02/2013  . Chest pain 11/01/2013  . Fatigue 11/01/2013  . Burning sensation in lower extremity 11/01/2013  . Environmental allergies 04/19/2013  . Arthritis 01/18/2013  . Depression 01/18/2013  . GERD (gastroesophageal reflux disease) 01/18/2013  . Glaucoma 01/18/2013  . History of colonic polyps 01/18/2013  . Carotid stenosis 01/18/2013    Hailly Fess, PT, MPT, GCS 04/27/2015, 11:48 AM  [place] [facility] [address] [place], [address] Phone: [phone]   Fax:  [phone]  Name: Bridget Briggs MRN: [id number] Date of Birth: Aug 01, 1930

## 2015-04-28 ENCOUNTER — Encounter: Payer: Medicare Other | Admitting: Physical Therapy

## 2015-05-01 ENCOUNTER — Ambulatory Visit: Payer: Medicare Other | Admitting: Physical Therapy

## 2015-05-01 DIAGNOSIS — M25552 Pain in left hip: Secondary | ICD-10-CM | POA: Diagnosis not present

## 2015-05-01 NOTE — Therapy (Signed)
[place] [facility] [address] [place], [address] Phone: [phone]   Fax:  [phone]  Physical Therapy Treatment  Patient Details  Name: Bridget Briggs MRN: [id number] Date of Birth: 1931-05-11 Referring Provider: Nicki Reaper  Encounter Date: 05/01/2015      PT End of Session - 05/01/15 1149    Visit Number 4   Number of Visits 9   Date for PT Re-Evaluation 05/18/15   PT Start Time 1115   PT Stop Time 1145   PT Time Calculation (min) 30 min   Activity Tolerance Patient tolerated treatment well;No increased pain   Behavior During Therapy [facility] for tasks assessed/performed      Past Medical History  Diagnosis Date  . GERD (gastroesophageal reflux disease)   . Arthritis   . Depression   . History of chicken pox   . Glaucoma   . Hx: UTI (urinary tract infection)   . History of colon polyps     Past Surgical History  Procedure Laterality Date  . Cholecystectomy  1996  . Appendectomy  1974  . Tonsillectomy and adenoidectomy  1940  . Breast surgery Right 1988    biopsy  . Abdominal hysterectomy  1974    left ovary removed  . Ankle surgery Left 1998  . Nissen fundoplication  99991111  . Eye surgery Bilateral 2005/2006    cataract  . Laparoscopic gastric banding with hiatal hernia repair  2000    There were no vitals filed for this visit.  Visit Diagnosis:  Hip joint pain, left      Subjective Assessment - 05/01/15 1146    Subjective Patient reports her pain is improving. She has been able to sleep a little beter on that side. Less tenderness with manual therapy.    Limitations Sitting   How long can you walk comfortably? 20 min   Diagnostic tests xrays taken which per pt show arthritis   Patient Stated Goals to have less pain when trying to sleep on left side.    Currently in Pain? Yes   Pain Score 3    Pain Location Hip   Pain Orientation Left   Pain Descriptors / Indicators Sore   Pain Type  Acute pain   Pain Frequency Intermittent   Multiple Pain Sites No           Clam shells 2x10 prone hip extension 2x10, prone hip extension with knee bend 2x10 with min assist for proper muscle contraction and form, standing hip extension using machine with 55# 2x10 reps for hip/glute strengthening, hip abduction machine with 25# 2x10 reps  cues provided. Nu step level 3 for 2 min, level 2 for 3 min. Total 5 min.  Manual therapy to left hip over proximal femur and IT band also using "the stick" roller for muscle tension and pain control.            PT Long Term Goals - 04/20/15 1114    PT LONG TERM GOAL #1   Title Patient will report decreased pain when lying on left side to < 2/10   Baseline 4/10   Time 4   Period Weeks   Status New   PT LONG TERM GOAL #2   Title Patient will demonstrate improved functional tolerance and activity with improved LEFS of 72/80   Baseline 69/80   Time 4   Period Weeks   Status New  Plan - 05/01/15 1149    Clinical Impression Statement Patient reports less soreness with STM and rolling. Patient improving with overall pain.    Pt will benefit from skilled therapeutic intervention in order to improve on the following deficits Pain;Decreased strength   Rehab Potential Fair   PT Frequency 2x / week   PT Duration 4 weeks   PT Treatment/Interventions Therapeutic exercise;Manual techniques;Moist Heat;Ultrasound   PT Home Exercise Plan continue with exercises at the [facility] as usual and walking.   Consulted and Agree with Plan of Care Patient        Problem List Patient Active Problem List   Diagnosis Date Noted  . Left hip pain 04/05/2015  . Chest pressure 02/15/2015  . Cough 02/07/2015  . Gas 12/04/2014  . Pain of left thumb 08/06/2014  . Health care maintenance 08/06/2014  . Osteoporosis 07/28/2014  . Bronchiectasis without acute exacerbation ([place]) 01/20/2014  . Multiple pulmonary nodules 01/20/2014  . Abnormal  CXR 11/02/2013  . Chest pain 11/01/2013  . Fatigue 11/01/2013  . Burning sensation in lower extremity 11/01/2013  . Environmental allergies 04/19/2013  . Arthritis 01/18/2013  . Depression 01/18/2013  . GERD (gastroesophageal reflux disease) 01/18/2013  . Glaucoma 01/18/2013  . History of colonic polyps 01/18/2013  . Carotid stenosis 01/18/2013    [name], PT, MPT, GCS 05/01/2015, 11:53 AM  [place] [facility] [address] [place], [address] Phone: [phone]   Fax:  [phone]  Name: Bridget Briggs MRN: [id number] Date of Birth: Feb 13, 1931

## 2015-05-04 ENCOUNTER — Ambulatory Visit: Payer: Medicare Other | Admitting: Physical Therapy

## 2015-05-04 DIAGNOSIS — M25552 Pain in left hip: Secondary | ICD-10-CM | POA: Diagnosis not present

## 2015-05-04 NOTE — Therapy (Signed)
[place] [facility] [address] [place], [address] Phone: [phone]   Fax:  [phone]  Physical Therapy Treatment  Patient Details  Name: Bridget Briggs MRN: [id number] Date of Birth: [birth year] Referring Provider: Nicki Reaper  Encounter Date: 05/04/2015      PT End of Session - 05/04/15 1053    Visit Number 5   Number of Visits 9   Date for PT Re-Evaluation 05/18/15   PT Start Time 1010   PT Stop Time 1050   PT Time Calculation (min) 40 min   Activity Tolerance Patient tolerated treatment well;No increased pain   Behavior During Therapy [facility] for tasks assessed/performed      Past Medical History  Diagnosis Date  . GERD (gastroesophageal reflux disease)   . Arthritis   . Depression   . History of chicken pox   . Glaucoma   . Hx: UTI (urinary tract infection)   . History of colon polyps     Past Surgical History  Procedure Laterality Date  . Cholecystectomy  1996  . Appendectomy  1974  . Tonsillectomy and adenoidectomy  1940  . Breast surgery Right 1988    biopsy  . Abdominal hysterectomy  1974    left ovary removed  . Ankle surgery Left 1998  . Nissen fundoplication  99991111  . Eye surgery Bilateral 2005/2006    cataract  . Laparoscopic gastric banding with hiatal hernia repair  2000    There were no vitals filed for this visit.  Visit Diagnosis:  Hip joint pain, left      Subjective Assessment - 05/04/15 1050    Subjective Patient reports she is feeling a little bit better. SHe is able to sleep better on her left side and has decreased tenderness with manual therapy.    Limitations Sitting   How long can you walk comfortably? 20 min   Diagnostic tests xrays taken which per pt show arthritis   Patient Stated Goals to have less pain when trying to sleep on left side.    Currently in Pain? Yes   Pain Location Hip   Pain Orientation Left   Pain Descriptors / Indicators Sore   Pain Type  Acute pain   Pain Onset More than a month ago   Pain Frequency Intermittent   Multiple Pain Sites No           Clam shells 2x10 prone hip extension 2x10, prone hip extension with knee bend 2x10 with min assist for proper muscle contraction and form, standing hip extension using machine with 55# 2x10 reps for hip/glute strengthening, hip abduction machine with 25# 2x10 reps bilaterally cues provided. Nu step level 2 Total 5 min.  Manual therapy to left hip over proximal femur and IT band also using "the stick" roller for muscle tension and pain control.        PT Education - 05/04/15 1052    Education provided No   Education Details exercises, proper form and set up.    Person(s) Educated Patient   Methods Explanation;Demonstration   Comprehension Verbalized understanding;Returned demonstration             PT Long Term Goals - 04/20/15 1114    PT LONG TERM GOAL #1   Title Patient will report decreased pain when lying on left side to < 2/10   Baseline 4/10   Time 4   Period Weeks   Status New   PT LONG TERM GOAL #2  Title Patient will demonstrate improved functional tolerance and activity with improved LEFS of 72/80   Baseline 69/80   Time 4   Period Weeks   Status New               Plan - 05/04/15 1055    Clinical Impression Statement Patient is making good progress with decreased pain level and improved ability to sleep on her left side at night.    Pt will benefit from skilled therapeutic intervention in order to improve on the following deficits Pain;Decreased strength   Rehab Potential Fair   PT Frequency 2x / week   PT Duration 4 weeks   PT Treatment/Interventions Therapeutic exercise;Manual techniques;Moist Heat;Ultrasound   PT Home Exercise Plan continue with exercises at the [facility] as usual and walking.   Consulted and Agree with Plan of Care Patient        Problem List Patient Active Problem List   Diagnosis Date Noted  . Left hip pain  04/05/2015  . Chest pressure 02/15/2015  . Cough 02/07/2015  . Gas 12/04/2014  . Pain of left thumb 08/06/2014  . Health care maintenance 08/06/2014  . Osteoporosis 07/28/2014  . Bronchiectasis without acute exacerbation ([place]) 01/20/2014  . Multiple pulmonary nodules 01/20/2014  . Abnormal CXR 11/02/2013  . Chest pain 11/01/2013  . Fatigue 11/01/2013  . Burning sensation in lower extremity 11/01/2013  . Environmental allergies 04/19/2013  . Arthritis 01/18/2013  . Depression 01/18/2013  . GERD (gastroesophageal reflux disease) 01/18/2013  . Glaucoma 01/18/2013  . History of colonic polyps 01/18/2013  . Carotid stenosis 01/18/2013    Juan Olthoff, PT, MPT, GCS 05/04/2015, 11:00 AM  [place] [facility] [address] [place], [address] Phone: [phone]   Fax:  [phone]  Name: Bridget Briggs MRN: [id number] Date of Birth: Aug 02, 1930

## 2015-05-05 ENCOUNTER — Encounter: Payer: Medicare Other | Admitting: Physical Therapy

## 2015-05-08 ENCOUNTER — Encounter: Payer: Medicare Other | Admitting: Physical Therapy

## 2015-05-09 ENCOUNTER — Ambulatory Visit: Payer: Medicare Other | Admitting: Physical Therapy

## 2015-05-09 DIAGNOSIS — M25552 Pain in left hip: Secondary | ICD-10-CM | POA: Diagnosis not present

## 2015-05-09 NOTE — Therapy (Signed)
[place] [facility] [address] [place], [address] Phone: [phone]   Fax:  [phone]  Physical Therapy Treatment  Patient Details  Name: GERALDYN AZIZ MRN: [id number] Date of Birth: 21-Feb-1931 Referring Provider: Nicki Reaper  Encounter Date: 05/09/2015      PT End of Session - 05/09/15 1647    Visit Number 6   Number of Visits 9   Date for PT Re-Evaluation 05/18/15   PT Start Time 1600   PT Stop Time 1645   PT Time Calculation (min) 45 min   Activity Tolerance Patient tolerated treatment well;Patient limited by pain   Behavior During Therapy [facility] for tasks assessed/performed      Past Medical History  Diagnosis Date  . GERD (gastroesophageal reflux disease)   . Arthritis   . Depression   . History of chicken pox   . Glaucoma   . Hx: UTI (urinary tract infection)   . History of colon polyps     Past Surgical History  Procedure Laterality Date  . Cholecystectomy  1996  . Appendectomy  1974  . Tonsillectomy and adenoidectomy  1940  . Breast surgery Right 1988    biopsy  . Abdominal hysterectomy  1974    left ovary removed  . Ankle surgery Left 1998  . Nissen fundoplication  99991111  . Eye surgery Bilateral 2005/2006    cataract  . Laparoscopic gastric banding with hiatal hernia repair  2000    There were no vitals filed for this visit.  Visit Diagnosis:  Hip joint pain, left      Subjective Assessment - 05/09/15 1645    Subjective Patient reports some improvement in pain but reports increased tenderness over lateral thigh on left.    Limitations Sitting   How long can you walk comfortably? 20 min   Diagnostic tests xrays taken which per pt show arthritis   Patient Stated Goals to have less pain when trying to sleep on left side.    Currently in Pain? Yes   Pain Score 5    Pain Location Hip   Pain Orientation Left   Pain Descriptors / Indicators Sore   Pain Type Acute pain   Pain Onset  More than a month ago   Pain Frequency Intermittent   Multiple Pain Sites No           Clam shells 2x10 prone hip extension 2x10, prone hip extension with knee bend 2x10 with min assist for proper muscle contraction and form, standing hip extension using machine with 55# 3x10 reps for hip/glute strengthening, hip abduction machine with 25# 2x10 reps bilaterally cues provided. Nu step level 2 Total 5 min.  Manual therapy to left hip over proximal femur and IT band also using "the stick" roller for muscle tension and pain control.                   PT Education - 05/09/15 1647    Education provided Yes   Education Details exercises, set up and proper form.   Person(s) Educated Patient   Methods Explanation;Demonstration;Verbal cues   Comprehension Verbalized understanding;Returned demonstration             PT Long Term Goals - 04/20/15 1114    PT LONG TERM GOAL #1   Title Patient will report decreased pain when lying on left side to < 2/10   Baseline 4/10   Time 4   Period Weeks   Status New  PT LONG TERM GOAL #2   Title Patient will demonstrate improved functional tolerance and activity with improved LEFS of 72/80   Baseline 69/80   Time 4   Period Weeks   Status New               Plan - 05/09/15 1648    Clinical Impression Statement Patient continues to have some hip pain. Decreased tenderness overall with increased tenderness over lateral hip/thigh.    Pt will benefit from skilled therapeutic intervention in order to improve on the following deficits Pain;Decreased strength   Rehab Potential Fair   PT Frequency 2x / week   PT Duration 4 weeks   PT Treatment/Interventions Therapeutic exercise;Manual techniques;Moist Heat;Ultrasound   PT Home Exercise Plan continue with exercises at the [facility] as usual and walking.   Consulted and Agree with Plan of Care Patient        Problem List Patient Active Problem List   Diagnosis Date Noted  .  Left hip pain 04/05/2015  . Chest pressure 02/15/2015  . Cough 02/07/2015  . Gas 12/04/2014  . Pain of left thumb 08/06/2014  . Health care maintenance 08/06/2014  . Osteoporosis 07/28/2014  . Bronchiectasis without acute exacerbation ([place]) 01/20/2014  . Multiple pulmonary nodules 01/20/2014  . Abnormal CXR 11/02/2013  . Chest pain 11/01/2013  . Fatigue 11/01/2013  . Burning sensation in lower extremity 11/01/2013  . Environmental allergies 04/19/2013  . Arthritis 01/18/2013  . Depression 01/18/2013  . GERD (gastroesophageal reflux disease) 01/18/2013  . Glaucoma 01/18/2013  . History of colonic polyps 01/18/2013  . Carotid stenosis 01/18/2013    Chanz Cahall, PT, MPT, GCS 05/09/2015, 4:49 PM  [facility] [place] [facility] [address] [place], [address] Phone: [phone]   Fax:  [phone]  Name: Bridget Briggs MRN: [id number] Date of Birth: 11-Dec-1930

## 2015-05-11 ENCOUNTER — Ambulatory Visit: Payer: Medicare Other | Admitting: Physical Therapy

## 2015-05-11 DIAGNOSIS — M25552 Pain in left hip: Secondary | ICD-10-CM

## 2015-05-11 DIAGNOSIS — R29898 Other symptoms and signs involving the musculoskeletal system: Secondary | ICD-10-CM

## 2015-05-11 NOTE — Therapy (Signed)
[place] [facility] [address], [place], [address] Phone: [phone]   Fax:  [phone]  Physical Therapy Treatment  Patient Details  Name: Bridget Briggs MRN: [id number] Date of Birth: 1930-12-05 Referring Provider: Nicki Reaper  Encounter Date: 05/11/2015      PT End of Session - 05/11/15 1041    Visit Number 7   Number of Visits 9   Date for PT Re-Evaluation 05/18/15   PT Start Time 1000   PT Stop Time 1040   PT Time Calculation (min) 40 min   Activity Tolerance Patient tolerated treatment well;No increased pain   Behavior During Therapy [facility] for tasks assessed/performed      Past Medical History  Diagnosis Date  . GERD (gastroesophageal reflux disease)   . Arthritis   . Depression   . History of chicken pox   . Glaucoma   . Hx: UTI (urinary tract infection)   . History of colon polyps     Past Surgical History  Procedure Laterality Date  . Cholecystectomy  1996  . Appendectomy  1974  . Tonsillectomy and adenoidectomy  1940  . Breast surgery Right 1988    biopsy  . Abdominal hysterectomy  1974    left ovary removed  . Ankle surgery Left 1998  . Nissen fundoplication  99991111  . Eye surgery Bilateral 2005/2006    cataract  . Laparoscopic gastric banding with hiatal hernia repair  2000    There were no vitals filed for this visit.  Visit Diagnosis:  Hip joint pain, left  Weakness of both hips      Subjective Assessment - 05/11/15 1039    Subjective Patient reports improvement in hip pain. Able to sleep some on her left  now.    Limitations Sitting  lying on her left side   How long can you walk comfortably? 20 min   Diagnostic tests xrays taken which per pt show arthritis   Patient Stated Goals to have less pain when trying to sleep on left side.    Currently in Pain? Yes   Pain Score 3    Pain Location Hip   Pain Orientation Left   Pain Descriptors / Indicators Sore;Tender   Pain  Type Chronic pain   Pain Onset More than a month ago   Pain Frequency Intermittent   Multiple Pain Sites No          Clam shells 3x10 prone hip extension 3x10, prone hip extension with knee bend 3x10 with min assist for proper muscle contraction and form, standing hip extension using machine with 55# 3x10 reps for hip/glute strengthening on left, hip abduction machine with 25# 2x10 reps bilaterally cues provided.  OMEGA leg extension with 10# 2x10 bilaterally.  Nu step level 2 Total 5 min.  Manual therapy to left hip over proximal femur and IT band also using "the stick" roller for muscle tension and pain control.         PT Education - 05/11/15 1040    Education provided Yes   Education Details new exercises, set up, proper form   Person(s) Educated Patient   Methods Explanation;Demonstration   Comprehension Verbalized understanding;Returned demonstration;Verbal cues required             PT Long Term Goals - 04/20/15 1114    PT LONG TERM GOAL #1   Title Patient will report decreased pain when lying on left side to < 2/10   Baseline 4/10  Time 4   Period Weeks   Status New   PT LONG TERM GOAL #2   Title Patient will demonstrate improved functional tolerance and activity with improved LEFS of 72/80   Baseline 69/80   Time 4   Period Weeks   Status New               Plan - 05/11/15 1041    Clinical Impression Statement Patient has improved hip pain on left. Reports decreased tenderness with rolling an manual therapy, but continues to have some tenderness over head of femur, and glute med today.   Pt will benefit from skilled therapeutic intervention in order to improve on the following deficits Pain;Decreased strength   Rehab Potential Fair   PT Frequency 2x / week   PT Duration 4 weeks   PT Treatment/Interventions Therapeutic exercise;Manual techniques;Moist Heat;Ultrasound   PT Home Exercise Plan continue with exercises at the [facility] as usual and  walking.   Consulted and Agree with Plan of Care Patient        Problem List Patient Active Problem List   Diagnosis Date Noted  . Left hip pain 04/05/2015  . Chest pressure 02/15/2015  . Cough 02/07/2015  . Gas 12/04/2014  . Pain of left thumb 08/06/2014  . Health care maintenance 08/06/2014  . Osteoporosis 07/28/2014  . Bronchiectasis without acute exacerbation ([place]) 01/20/2014  . Multiple pulmonary nodules 01/20/2014  . Abnormal CXR 11/02/2013  . Chest pain 11/01/2013  . Fatigue 11/01/2013  . Burning sensation in lower extremity 11/01/2013  . Environmental allergies 04/19/2013  . Arthritis 01/18/2013  . Depression 01/18/2013  . GERD (gastroesophageal reflux disease) 01/18/2013  . Glaucoma 01/18/2013  . History of colonic polyps 01/18/2013  . Carotid stenosis 01/18/2013    Tyric Rodeheaver, PT, MPT, GCS 05/11/2015, 10:43 AM  [place] [facility] [address] [place], [address] Phone: [phone]   Fax:  [phone]  Name: Bridget Briggs MRN: [id number] Date of Birth: 09-Feb-1931

## 2015-05-16 ENCOUNTER — Ambulatory Visit: Payer: Medicare Other | Admitting: Physical Therapy

## 2015-05-16 DIAGNOSIS — M25552 Pain in left hip: Secondary | ICD-10-CM | POA: Diagnosis not present

## 2015-05-16 DIAGNOSIS — R29898 Other symptoms and signs involving the musculoskeletal system: Secondary | ICD-10-CM

## 2015-05-16 NOTE — Therapy (Signed)
[facility] [address], [place], [address] Phone: [phone]   Fax:  [phone]  Physical Therapy Treatment  Patient Details  Name: Bridget Briggs MRN: [id number] Date of Birth: May 21, 1930 Referring Provider: Nicki Reaper  Encounter Date: 05/16/2015      PT End of Session - 05/16/15 1126    Visit Number 8   Number of Visits 9   Date for PT Re-Evaluation 05/18/15   PT Start Time 1050   PT Stop Time 1130   PT Time Calculation (min) 40 min   Activity Tolerance Patient tolerated treatment well;No increased pain   Behavior During Therapy [facility] for tasks assessed/performed      Past Medical History  Diagnosis Date  . GERD (gastroesophageal reflux disease)   . Arthritis   . Depression   . History of chicken pox   . Glaucoma   . Hx: UTI (urinary tract infection)   . History of colon polyps     Past Surgical History  Procedure Laterality Date  . Cholecystectomy  1996  . Appendectomy  1974  . Tonsillectomy and adenoidectomy  1940  . Breast surgery Right 1988    biopsy  . Abdominal hysterectomy  1974    left ovary removed  . Ankle surgery Left 1998  . Nissen fundoplication  99991111  . Eye surgery Bilateral 2005/2006    cataract  . Laparoscopic gastric banding with hiatal hernia repair  2000    There were no vitals filed for this visit.  Visit Diagnosis:  Weakness of both hips      Subjective Assessment - 05/16/15 1052    Subjective Pt is continuing to make improvements but also continues to have pain in hip especially with any pressure put on L lateral hip.   Limitations Sitting  lying on her left side   How long can you walk comfortably? 20 min   Diagnostic tests xrays taken which per pt show arthritis   Patient Stated Goals to have less pain when trying to sleep on left side.    Currently in Pain? No/denies   Pain Score 0-No pain   Pain Location Hip   Pain Orientation Left   Pain Onset More  than a month ago                         Objective: SL extensive STM performed on vastus lateralis, IT band, TFL. Pt reported pain initially with this but gradually pain improved and noted decr. Pain with stretching following this.  Standing stair stretch/lunge on 2nd step 3x10 with cuing to keep knee in neutral (avoid valgus) to minimize pain in knee with performance.  amb with diagonal step 3x15 steps each side with cuing for neutral pelvis for functional strength.  Hip abduction, hip extension toe taps in standing 3x15 each side.  Nu-step L1 x5 min (no charge).             PT Long Term Goals - 04/20/15 1114    PT LONG TERM GOAL #1   Title Patient will report decreased pain when lying on left side to < 2/10   Baseline 4/10   Time 4   Period Weeks   Status New   PT LONG TERM GOAL #2   Title Patient will demonstrate improved functional tolerance and activity with improved LEFS of 72/80   Baseline 69/80   Time 4   Period Weeks   Status New  Plan - 05/16/15 1126    Clinical Impression Statement Pt continues to have L hip pain when L IT band region is compressed. difficulty with full WB on L LE with gait.   Pt will benefit from skilled therapeutic intervention in order to improve on the following deficits Pain;Decreased strength   Rehab Potential Fair   PT Frequency 2x / week   PT Duration 4 weeks   PT Treatment/Interventions Therapeutic exercise;Manual techniques;Moist Heat;Ultrasound   PT Home Exercise Plan continue with exercises at the [facility] as usual and walking.   Consulted and Agree with Plan of Care Patient        Problem List Patient Active Problem List   Diagnosis Date Noted  . Left hip pain 04/05/2015  . Chest pressure 02/15/2015  . Cough 02/07/2015  . Gas 12/04/2014  . Pain of left thumb 08/06/2014  . Health care maintenance 08/06/2014  . Osteoporosis 07/28/2014  . Bronchiectasis without acute exacerbation  ([place]) 01/20/2014  . Multiple pulmonary nodules 01/20/2014  . Abnormal CXR 11/02/2013  . Chest pain 11/01/2013  . Fatigue 11/01/2013  . Burning sensation in lower extremity 11/01/2013  . Environmental allergies 04/19/2013  . Arthritis 01/18/2013  . Depression 01/18/2013  . GERD (gastroesophageal reflux disease) 01/18/2013  . Glaucoma 01/18/2013  . History of colonic polyps 01/18/2013  . Carotid stenosis 01/18/2013    Fisher,Benjamin PT DPT 05/16/2015, 11:42 AM  [facility] [address] [place], [address] Phone: [phone]   Fax:  [phone]  Name: Bridget Briggs MRN: [id number] Date of Birth: 12/07/1930

## 2015-05-18 ENCOUNTER — Ambulatory Visit: Payer: Medicare Other | Admitting: Physical Therapy

## 2015-05-18 DIAGNOSIS — M25552 Pain in left hip: Secondary | ICD-10-CM

## 2015-05-18 DIAGNOSIS — R29898 Other symptoms and signs involving the musculoskeletal system: Secondary | ICD-10-CM

## 2015-05-18 NOTE — Therapy (Signed)
[facility] [address], [place], [address] Phone: [phone]   Fax:  [phone]  Physical Therapy Treatment  Patient Details  Name: Bridget Briggs MRN: [id number] Date of Birth: 23-Sep-1930 Referring Provider: Nicki Reaper  Encounter Date: 05/18/2015      PT End of Session - 05/18/15 1125    Visit Number 9   Number of Visits 9   Date for PT Re-Evaluation 06/15/15   PT Start Time [id number]   PT Stop Time 1125   PT Time Calculation (min) 42 min   Activity Tolerance Patient tolerated treatment well;No increased pain   Behavior During Therapy [facility] for tasks assessed/performed      Past Medical History  Diagnosis Date  . GERD (gastroesophageal reflux disease)   . Arthritis   . Depression   . History of chicken pox   . Glaucoma   . Hx: UTI (urinary tract infection)   . History of colon polyps     Past Surgical History  Procedure Laterality Date  . Cholecystectomy  1996  . Appendectomy  1974  . Tonsillectomy and adenoidectomy  1940  . Breast surgery Right 1988    biopsy  . Abdominal hysterectomy  1974    left ovary removed  . Ankle surgery Left 1998  . Nissen fundoplication  [id number]  . Eye surgery Bilateral 2005/2006    cataract  . Laparoscopic gastric banding with hiatal hernia repair  2000    There were no vitals filed for this visit.  Visit Diagnosis:  Weakness of both hips - Plan: PT plan of care cert/re-cert  Hip joint pain, left - Plan: PT plan of care cert/re-cert      Subjective Assessment - 05/18/15 1122    Subjective Patient reports some improvement but continues to report 3-4/10 pain this session.    Limitations Sitting;Other (comment)  lying on her left side   Currently in Pain? Yes   Pain Score 3    Pain Location Hip   Pain Orientation Left   Pain Descriptors / Indicators Sore;Tender   Pain Type Chronic pain   Pain Onset More than a month ago   Pain Frequency Intermittent   Multiple Pain Sites No        Clam shells 3x10 prone hip extension 3x10, prone hip extension with knee bend 3x10 with min assist for proper muscle contraction and form, standing hip extension using machine with 55# 3x10 reps for hip/glute strengthening on left, hip abduction machine with 25# 2x10 reps bilaterally cues provided. OMEGA leg extension with 10# 2x10 bilaterally. Nu step level 2 Total 5 min.  Manual therapy to left hip over proximal femur and IT band also using "the stick" roller for muscle tension and pain control.  Re-assessed goals, pain continues to be an issue. Rating pain at 4/10. Continues to have limitations with functional activities the most relevant being squatting and performing heavy activities at home.            PT Education - 05/18/15 1125    Education provided Yes   Education Details set up and proper form for exercises   Person(s) Educated Patient   Methods Explanation;Demonstration   Comprehension Verbalized understanding;Returned demonstration             PT Long Term Goals - 05/18/15 1128    PT LONG TERM GOAL #1   Title Patient will report decreased pain when lying on left side to < 2/10   Baseline  4/10   Time 4   Period Weeks   Status On-going   PT LONG TERM GOAL #2   Title Patient will demonstrate improved functional tolerance and activity with improved LEFS of 72/80   Baseline 69/80, at re-eval 63/80   Time 4   Period Weeks   Status On-going               Plan - 05/18/15 1127    Clinical Impression Statement Patient continues to have left hip pain with STM and palpation 3-4/10 pain this session.    Pt will benefit from skilled therapeutic intervention in order to improve on the following deficits Pain;Decreased strength   Rehab Potential Fair   PT Frequency 1x / week   PT Duration 4 weeks   PT Treatment/Interventions Therapeutic exercise;Manual techniques;Moist Heat;Ultrasound   PT Home Exercise Plan continue with  exercises at the [facility] as usual and walking.   Consulted and Agree with Plan of Care Patient        Problem List Patient Active Problem List   Diagnosis Date Noted  . Left hip pain 04/05/2015  . Chest pressure 02/15/2015  . Cough 02/07/2015  . Gas 12/04/2014  . Pain of left thumb 08/06/2014  . Health care maintenance 08/06/2014  . Osteoporosis 07/28/2014  . Bronchiectasis without acute exacerbation ([place]) 01/20/2014  . Multiple pulmonary nodules 01/20/2014  . Abnormal CXR 11/02/2013  . Chest pain 11/01/2013  . Fatigue 11/01/2013  . Burning sensation in lower extremity 11/01/2013  . Environmental allergies 04/19/2013  . Arthritis 01/18/2013  . Depression 01/18/2013  . GERD (gastroesophageal reflux disease) 01/18/2013  . Glaucoma 01/18/2013  . History of colonic polyps 01/18/2013  . Carotid stenosis 01/18/2013    Yamilette Garretson, PT, MPT, GCS 05/18/2015, 11:32 AM  [facility] [address] [place], [address] Phone: [phone]   Fax:  [phone]  Name: ADRAYA HOWTON MRN: [id number] Date of Birth: 03-Aug-1930

## 2015-05-19 ENCOUNTER — Encounter: Payer: Medicare Other | Admitting: Physical Therapy

## 2015-05-23 ENCOUNTER — Ambulatory Visit: Payer: Medicare Other | Admitting: Physical Therapy

## 2015-05-25 ENCOUNTER — Encounter: Payer: Medicare Other | Admitting: Physical Therapy

## 2015-05-29 ENCOUNTER — Encounter: Payer: Medicare Other | Admitting: Physical Therapy

## 2015-05-30 ENCOUNTER — Encounter: Payer: Medicare Other | Admitting: Physical Therapy

## 2015-05-31 ENCOUNTER — Encounter: Payer: Medicare Other | Admitting: Physical Therapy

## 2015-06-01 ENCOUNTER — Ambulatory Visit: Payer: Medicare Other | Attending: Internal Medicine | Admitting: Physical Therapy

## 2015-06-01 DIAGNOSIS — M25552 Pain in left hip: Secondary | ICD-10-CM

## 2015-06-01 DIAGNOSIS — R29898 Other symptoms and signs involving the musculoskeletal system: Secondary | ICD-10-CM

## 2015-06-01 DIAGNOSIS — M6289 Other specified disorders of muscle: Secondary | ICD-10-CM | POA: Diagnosis present

## 2015-06-01 NOTE — Therapy (Signed)
[facility] [address], [place], [address] Phone: [phone]   Fax:  [phone]  Physical Therapy Treatment  Patient Details  Name: Bridget Briggs MRN: [id number] Date of Birth: May 27, 1930 Referring Provider: Nicki Reaper  Encounter Date: 06/01/2015      PT End of Session - 06/01/15 1128    Visit Number 10   Number of Visits 9   Date for PT Re-Evaluation 06/15/15   PT Start Time 0945   PT Stop Time 1025   PT Time Calculation (min) 40 min   Activity Tolerance Patient tolerated treatment well;No increased pain   Behavior During Therapy [facility] for tasks assessed/performed      Past Medical History  Diagnosis Date  . GERD (gastroesophageal reflux disease)   . Arthritis   . Depression   . History of chicken pox   . Glaucoma   . Hx: UTI (urinary tract infection)   . History of colon polyps     Past Surgical History  Procedure Laterality Date  . Cholecystectomy  1996  . Appendectomy  1974  . Tonsillectomy and adenoidectomy  1940  . Breast surgery Right 1988    biopsy  . Abdominal hysterectomy  1974    left ovary removed  . Ankle surgery Left 1998  . Nissen fundoplication  [id number]  . Eye surgery Bilateral 2005/2006    cataract  . Laparoscopic gastric banding with hiatal hernia repair  2000    There were no vitals filed for this visit.  Visit Diagnosis:  Hip joint pain, left - Plan: PT plan of care cert/re-cert  Weakness of both hips - Plan: PT plan of care cert/re-cert      Subjective Assessment - 06/01/15 1139    Subjective pt reports a decrease in symptoms sice last visit, able to perform ADLs, remains limited in ability in lying on L side due to pain with compression.   Limitations Sitting;Other (comment)  lying on her left side   How long can you walk comfortably? 20 min   Diagnostic tests xrays taken which per pt show arthritis   Patient Stated Goals to have less pain when trying to sleep  on left side.    Currently in Pain? Yes   Pain Score 1    Pain Location Hip   Pain Orientation Left   Pain Onset More than a month ago           Objective: STM of G-max, G-med, piriformis and greater trochanteric area. Pt responded especially well to TP compression and cross friction mob of piriformis as demonstrated by a decr in pain in all above areas.  Attempted piriformis stretch in supine. Pt unable to tolerate due to poor ability to relax.   Glute sets performed in hooklying X 5 to prepare for bridging with verbal cueing for gluteal contraction. Partial bridging performed X 10 for glute activation with 2 sec. Holds, cuing to breathe. Pt responded well with no pain.  Demonstrated and performed self glute/piriformis STM utilizing a rolled washcloth under the piriformis in hooklying. Performed 20 reps of piriformis compression by rolling hips more onto washcloth and then away from washcloth. Instructed to perform same technique for HEP - to be performed at night prior to going to bed.                      PT Education - 06/01/15 1114    Education provided Yes   Education Details  pt edu in piriformis compression utilizing rolled washcloth in hooklying   Person(s) Educated Patient   Methods Explanation;Tactile cues;Verbal cues   Comprehension Verbalized understanding;Returned demonstration;Verbal cues required             PT Long Term Goals - 05/18/15 1128    PT LONG TERM GOAL #1   Title Patient will report decreased pain when lying on left side to < 2/10   Baseline 4/10   Time 4   Period Weeks   Status On-going   PT LONG TERM GOAL #2   Title Patient will demonstrate improved functional tolerance and activity with improved LEFS of 72/80   Baseline 69/80, at re-eval 63/80   Time 4   Period Weeks   Status On-going               Plan - 07-01-15 1129    Clinical Impression Statement pt responds well to STM, however has limitation in response to  progression to self care.Focus of next session on progressing to tolerable self treatment so pt is able to maintain improvements in pain between sessions.   Pt will benefit from skilled therapeutic intervention in order to improve on the following deficits Pain;Decreased strength   Rehab Potential Fair   PT Frequency 1x / week   PT Duration 4 weeks   PT Treatment/Interventions Therapeutic exercise;Manual techniques;Moist Heat;Ultrasound   PT Next Visit Plan reassess ability to perform self STM and ability to attain position of piriformis stretching. progress to global hip stretching and strengthing   PT Home Exercise Plan continue with exercises at the [facility] as usual and walking.   Consulted and Agree with Plan of Care Patient          G-Codes - 07/01/2015 1148    Functional Assessment Tool Used Pt report of pain   Functional Limitation Mobility: Walking and moving around   Mobility: Walking and Moving Around Current Status [phone]) At least 1 percent but less than 20 percent impaired, limited or restricted   Mobility: Walking and Moving Around Goal Status [phone]) At least 1 percent but less than 20 percent impaired, limited or restricted      Problem List Patient Active Problem List   Diagnosis Date Noted  . Left hip pain 04/05/2015  . Chest pressure 02/15/2015  . Cough 02/07/2015  . Gas 12/04/2014  . Pain of left thumb 08/06/2014  . Health care maintenance 08/06/2014  . Osteoporosis 07/28/2014  . Bronchiectasis without acute exacerbation ([place]) 01/20/2014  . Multiple pulmonary nodules 01/20/2014  . Abnormal CXR 11/02/2013  . Chest pain 11/01/2013  . Fatigue 11/01/2013  . Burning sensation in lower extremity 11/01/2013  . Environmental allergies 04/19/2013  . Arthritis 01/18/2013  . Depression 01/18/2013  . GERD (gastroesophageal reflux disease) 01/18/2013  . Glaucoma 01/18/2013  . History of colonic polyps 01/18/2013  . Carotid stenosis 01/18/2013    Hermilo Dutter PT  DPT 07-01-2015, 11:50 AM  [facility] [address] [place], [address] Phone: [phone]   Fax:  [phone]  Name: Bridget Briggs MRN: [id number] Date of Birth: 10/10/1930

## 2015-06-06 ENCOUNTER — Ambulatory Visit: Payer: Medicare Other | Admitting: Physical Therapy

## 2015-06-08 ENCOUNTER — Emergency Department: Payer: Medicare Other

## 2015-06-08 ENCOUNTER — Encounter: Payer: Self-pay | Admitting: Internal Medicine

## 2015-06-08 ENCOUNTER — Ambulatory Visit (INDEPENDENT_AMBULATORY_CARE_PROVIDER_SITE_OTHER): Payer: Medicare Other | Admitting: Family Medicine

## 2015-06-08 ENCOUNTER — Encounter: Payer: Self-pay | Admitting: Family Medicine

## 2015-06-08 ENCOUNTER — Inpatient Hospital Stay
Admission: EM | Admit: 2015-06-08 | Discharge: 2015-06-10 | DRG: 309 | Disposition: A | Discharge status: Home / Self Care | Payer: Medicare Other | Attending: Internal Medicine | Admitting: Internal Medicine

## 2015-06-08 VITALS — BP 136/62 | HR 66 | Temp 97.4°F | Ht 64.0 in | Wt 181.4 lb

## 2015-06-08 DIAGNOSIS — I4891 Unspecified atrial fibrillation: Secondary | ICD-10-CM | POA: Diagnosis present

## 2015-06-08 DIAGNOSIS — R197 Diarrhea, unspecified: Secondary | ICD-10-CM

## 2015-06-08 DIAGNOSIS — Z87891 Personal history of nicotine dependence: Secondary | ICD-10-CM | POA: Diagnosis not present

## 2015-06-08 DIAGNOSIS — F329 Major depressive disorder, single episode, unspecified: Secondary | ICD-10-CM | POA: Diagnosis present

## 2015-06-08 DIAGNOSIS — A047 Enterocolitis due to Clostridium difficile: Secondary | ICD-10-CM | POA: Diagnosis present

## 2015-06-08 DIAGNOSIS — K219 Gastro-esophageal reflux disease without esophagitis: Secondary | ICD-10-CM | POA: Diagnosis present

## 2015-06-08 DIAGNOSIS — Z6831 Body mass index (BMI) 31.0-31.9, adult: Secondary | ICD-10-CM

## 2015-06-08 DIAGNOSIS — M1990 Unspecified osteoarthritis, unspecified site: Secondary | ICD-10-CM | POA: Diagnosis present

## 2015-06-08 DIAGNOSIS — M810 Age-related osteoporosis without current pathological fracture: Secondary | ICD-10-CM | POA: Diagnosis present

## 2015-06-08 DIAGNOSIS — H409 Unspecified glaucoma: Secondary | ICD-10-CM | POA: Diagnosis present

## 2015-06-08 DIAGNOSIS — Z79899 Other long term (current) drug therapy: Secondary | ICD-10-CM

## 2015-06-08 DIAGNOSIS — E876 Hypokalemia: Secondary | ICD-10-CM | POA: Diagnosis present

## 2015-06-08 DIAGNOSIS — I480 Paroxysmal atrial fibrillation: Secondary | ICD-10-CM | POA: Diagnosis present

## 2015-06-08 DIAGNOSIS — E669 Obesity, unspecified: Secondary | ICD-10-CM | POA: Diagnosis present

## 2015-06-08 DIAGNOSIS — E871 Hypo-osmolality and hyponatremia: Secondary | ICD-10-CM | POA: Diagnosis present

## 2015-06-08 DIAGNOSIS — Z880 Allergy status to penicillin: Secondary | ICD-10-CM

## 2015-06-08 DIAGNOSIS — I10 Essential (primary) hypertension: Secondary | ICD-10-CM | POA: Diagnosis present

## 2015-06-08 DIAGNOSIS — R350 Frequency of micturition: Secondary | ICD-10-CM | POA: Diagnosis not present

## 2015-06-08 LAB — CBC WITH DIFFERENTIAL/PLATELET
Basophils Absolute: 0 10*3/uL (ref 0–0.1)
Basophils Relative: 0 %
Eosinophils Absolute: 0 10*3/uL (ref 0–0.7)
Eosinophils Relative: 0 %
HCT: 43.4 % (ref 35.0–47.0)
HEMOGLOBIN: 14.4 g/dL (ref 12.0–16.0)
LYMPHS ABS: 0.9 10*3/uL — AB (ref 1.0–3.6)
Lymphocytes Relative: 13 %
MCH: 27.9 pg (ref 26.0–34.0)
MCHC: 33.2 g/dL (ref 32.0–36.0)
MCV: 84.2 fL (ref 80.0–100.0)
Monocytes Absolute: 1.8 10*3/uL — ABNORMAL HIGH (ref 0.2–0.9)
Monocytes Relative: 25 %
NEUTROS ABS: 4.5 10*3/uL (ref 1.4–6.5)
NEUTROS PCT: 62 %
PLATELETS: 174 10*3/uL (ref 150–440)
RBC: 5.16 MIL/uL (ref 3.80–5.20)
RDW: 13.9 % (ref 11.5–14.5)
WBC: 7.3 10*3/uL (ref 3.6–11.0)

## 2015-06-08 LAB — POCT URINALYSIS DIPSTICK
Glucose, UA: NEGATIVE
Ketones, UA: 15
Leukocytes, UA: NEGATIVE
NITRITE UA: NEGATIVE
PH UA: 6
PROTEIN UA: 100
RBC UA: NEGATIVE
Spec Grav, UA: >=1.03
Urobilinogen, UA: 0.2

## 2015-06-08 LAB — COMPREHENSIVE METABOLIC PANEL
ALT: 22 U/L (ref 14–54)
ANION GAP: 11 (ref 5–15)
AST: 26 U/L (ref 15–41)
Albumin: 3.7 g/dL (ref 3.5–5.0)
Alkaline Phosphatase: 55 U/L (ref 38–126)
BILIRUBIN TOTAL: 0.8 mg/dL (ref 0.3–1.2)
BUN: 15 mg/dL (ref 6–20)
CHLORIDE: 100 mmol/L — AB (ref 101–111)
CO2: 21 mmol/L — ABNORMAL LOW (ref 22–32)
Calcium: 9.3 mg/dL (ref 8.9–10.3)
Creatinine, Ser: 0.95 mg/dL (ref 0.44–1.00)
GFR calc Af Amer: >60 mL/min (ref >60)
GFR, EST NON AFRICAN AMERICAN: 54 mL/min — AB (ref >60)
GLUCOSE: 129 mg/dL — AB (ref 65–99)
POTASSIUM: 3.2 mmol/L — AB (ref 3.5–5.1)
Sodium: 132 mmol/L — ABNORMAL LOW (ref 135–145)
TOTAL PROTEIN: 6.9 g/dL (ref 6.5–8.1)

## 2015-06-08 LAB — TSH: TSH: 3.186 u[IU]/mL (ref 0.350–4.500)

## 2015-06-08 LAB — TROPONIN I: Troponin I: 0.06 ng/mL — ABNORMAL HIGH (ref <0.031)

## 2015-06-08 LAB — PHOSPHORUS: PHOSPHORUS: 2.1 mg/dL — AB (ref 2.5–4.6)

## 2015-06-08 LAB — MAGNESIUM: MAGNESIUM: 2 mg/dL (ref 1.7–2.4)

## 2015-06-08 LAB — LIPASE, BLOOD: LIPASE: 20 U/L (ref 11–51)

## 2015-06-08 MED ORDER — SODIUM CHLORIDE 0.9 % IV SOLN: 250.0000 mL | INTRAVENOUS | Status: DC | PRN

## 2015-06-08 MED ORDER — ONDANSETRON HCL 4 MG PO TABS: 4.0000 mg | ORAL_TABLET | Freq: Four times a day (QID) | ORAL | Status: DC | PRN

## 2015-06-08 MED ORDER — ACETAMINOPHEN 650 MG RE SUPP
650.0000 mg | Freq: Four times a day (QID) | RECTAL | Status: DC | PRN
Start: 2015-06-08 — End: 2015-06-10

## 2015-06-08 MED ORDER — SODIUM CHLORIDE 0.9 % IJ SOLN
3.0000 mL | Freq: Two times a day (BID) | INTRAMUSCULAR | Status: DC
  Administered 2015-06-08 – 2015-06-10 (×3): 3 mL via INTRAVENOUS

## 2015-06-08 MED ORDER — DORZOLAMIDE HCL-TIMOLOL MAL 2-0.5 % OP SOLN
1.0000 [drp] | Freq: Two times a day (BID) | OPHTHALMIC | Status: DC
  Administered 2015-06-08 – 2015-06-10 (×4): 1 [drp] via OPHTHALMIC
  Filled 2015-06-08 (×2): qty 10

## 2015-06-08 MED ORDER — ALBUTEROL SULFATE (2.5 MG/3ML) 0.083% IN NEBU: 2.5000 mg | INHALATION_SOLUTION | RESPIRATORY_TRACT | Status: DC | PRN

## 2015-06-08 MED ORDER — METOPROLOL TARTRATE 50 MG PO TABS
50.0000 mg | ORAL_TABLET | Freq: Two times a day (BID) | ORAL | Status: DC
Start: 2015-06-08 — End: 2015-06-09
  Administered 2015-06-08 – 2015-06-09 (×2): 50 mg via ORAL
  Filled 2015-06-08 (×2): qty 1

## 2015-06-08 MED ORDER — POTASSIUM & SODIUM PHOSPHATES 280-160-250 MG PO PACK
2.0000 | PACK | Freq: Three times a day (TID) | ORAL | Status: AC
  Administered 2015-06-08 – 2015-06-09 (×3): 2 via ORAL
  Filled 2015-06-08 (×4): qty 2

## 2015-06-08 MED ORDER — ACETAMINOPHEN 325 MG PO TABS: 650.0000 mg | ORAL_TABLET | Freq: Four times a day (QID) | ORAL | Status: DC | PRN

## 2015-06-08 MED ORDER — SODIUM CHLORIDE 0.9 % IV BOLUS (SEPSIS)
1000.0000 mL | Freq: Once | INTRAVENOUS | Status: AC
  Administered 2015-06-08: 1000 mL via INTRAVENOUS

## 2015-06-08 MED ORDER — LOPERAMIDE HCL 2 MG PO CAPS
4.0000 mg | ORAL_CAPSULE | Freq: Once | ORAL | Status: AC
  Administered 2015-06-08: 4 mg via ORAL
  Filled 2015-06-08: qty 2

## 2015-06-08 MED ORDER — LATANOPROST 0.005 % OP SOLN
1.0000 [drp] | Freq: Every day | OPHTHALMIC | Status: DC
  Administered 2015-06-08 – 2015-06-09 (×2): 1 [drp] via OPHTHALMIC
  Filled 2015-06-08: qty 2.5

## 2015-06-08 MED ORDER — LOPERAMIDE HCL 2 MG PO CAPS
2.0000 mg | ORAL_CAPSULE | ORAL | Status: DC | PRN
Start: 2015-06-08 — End: 2015-06-09
  Administered 2015-06-09 (×2): 2 mg via ORAL
  Filled 2015-06-08 (×2): qty 1

## 2015-06-08 MED ORDER — SODIUM CHLORIDE 0.9 % IJ SOLN: 3.0000 mL | INTRAMUSCULAR | Status: DC | PRN

## 2015-06-08 MED ORDER — METOPROLOL TARTRATE 1 MG/ML IV SOLN
5.0000 mg | Freq: Once | INTRAVENOUS | Status: AC
  Administered 2015-06-08: 5 mg via INTRAVENOUS
  Filled 2015-06-08: qty 5

## 2015-06-08 MED ORDER — DILTIAZEM HCL 25 MG/5ML IV SOLN
10.0000 mg | Freq: Once | INTRAVENOUS | Status: AC
  Administered 2015-06-08: 10 mg via INTRAVENOUS
  Filled 2015-06-08: qty 5

## 2015-06-08 MED ORDER — ASPIRIN EC 81 MG PO TBEC
81.0000 mg | DELAYED_RELEASE_TABLET | Freq: Every day | ORAL | Status: DC
  Administered 2015-06-08 – 2015-06-10 (×3): 81 mg via ORAL
  Filled 2015-06-08 (×3): qty 1

## 2015-06-08 MED ORDER — ONDANSETRON HCL 4 MG/2ML IJ SOLN: 4.0000 mg | Freq: Four times a day (QID) | INTRAMUSCULAR | Status: DC | PRN

## 2015-06-08 MED ORDER — POTASSIUM CHLORIDE CRYS ER 20 MEQ PO TBCR
40.0000 meq | EXTENDED_RELEASE_TABLET | Freq: Once | ORAL | Status: AC
  Administered 2015-06-08: 40 meq via ORAL
  Filled 2015-06-08: qty 2

## 2015-06-08 MED ORDER — SODIUM CHLORIDE 0.9 % IJ SOLN
3.0000 mL | Freq: Two times a day (BID) | INTRAMUSCULAR | Status: DC
  Administered 2015-06-09 – 2015-06-10 (×2): 3 mL via INTRAVENOUS

## 2015-06-08 MED ORDER — ZOLPIDEM TARTRATE 5 MG PO TABS: 5.0000 mg | ORAL_TABLET | Freq: Every evening | ORAL | Status: DC | PRN

## 2015-06-08 MED ORDER — POLYETHYLENE GLYCOL 3350 17 G PO PACK: 17.0000 g | PACK | Freq: Every day | ORAL | Status: DC | PRN

## 2015-06-08 MED ORDER — DIPHENHYDRAMINE-APAP (SLEEP) 25-500 MG PO TABS: 1.0000 | ORAL_TABLET | Freq: Every evening | ORAL | Status: DC | PRN

## 2015-06-08 MED ORDER — ENOXAPARIN SODIUM 80 MG/0.8ML ~~LOC~~ SOLN
1.0000 mg/kg | Freq: Two times a day (BID) | SUBCUTANEOUS | Status: DC
  Administered 2015-06-08 – 2015-06-10 (×4): 80 mg via SUBCUTANEOUS
  Filled 2015-06-08 (×4): qty 0.8

## 2015-06-08 MED ORDER — DILTIAZEM HCL 25 MG/5ML IV SOLN
10.0000 mg | INTRAVENOUS | Status: DC | PRN
  Administered 2015-06-08: 10 mg via INTRAVENOUS
  Filled 2015-06-08: qty 5

## 2015-06-08 NOTE — ED Notes (Signed)
Pt ambulatory to bathroom at this time with no concerns noted. Pt tolerated well, no acute distress noted.

## 2015-06-08 NOTE — Progress Notes (Signed)
Pre visit review using our clinic review tool, if applicable. No additional management support is needed unless otherwise documented below in the visit note. 

## 2015-06-08 NOTE — Progress Notes (Signed)
Subjective:  Patient ID: Bridget Briggs, female    DOB: [birth year]  Age: 82 y.o. MRN: [id number]  CC: Diarrhea  HPI:  82 year old female with a past medical history of bronchiectasis presents with complaints of diarrhea.   Patient reports that she's had diarrhea since Monday. She reports frequent loose stool. No associated fever but she states that she did have chills on Monday. No hematochezia or melena. No associated abdominal pain. She reports associated severe weakness and fatigue. She states that earlier today she was unable to make her bed without having to rest. She's been taking antidiarrheals with no improvement. No known exacerbating factors. She reports that she's had chest pressure but states that this is chronic. No reports of shortness of breath.  Social Hx   Social History   Social History  . Marital Status: Married    Spouse Name: N/A  . Number of Children: N/A  . Years of Education: N/A   Social History Main Topics  . Smoking status: Former Smoker -- 1.00 packs/day for 30 years    Types: Cigarettes    Quit date: 05/20/1988  . Smokeless tobacco: Never Used  . Alcohol Use: No  . Drug Use: No  . Sexual Activity: No   Other Topics Concern  . None   Social History Narrative   Review of Systems  Constitutional: Positive for chills.  Gastrointestinal: Positive for diarrhea. Negative for abdominal pain.   Objective:  BP 136/62 mmHg  Pulse 66  Temp(Src) 97.4 F (36.3 C) (Oral)  Ht '5\' 4"'  (1.626 m)  Wt 181 lb 6 oz (82.271 kg)  BMI 31.12 kg/m2  SpO2 98%  BP/Weight 06/08/2015 04/26/2015 53/74/8270  Systolic BP 786 754 492  Diastolic BP 62 72 64  Wt. (Lbs) 181.38 188.8 185.5  BMI 31.12 32.39 31.83   Physical Exam  Constitutional: She is oriented to person, place, and time. She appears well-developed. No distress.  Cardiovascular:  Irregularly irregular. Tachycardic.  Pulmonary/Chest: Effort normal and breath sounds normal.  Abdominal: Soft. She exhibits  no distension. There is no rebound and no guarding.  Patient reports mild tenderness palpation lower abdomen.  Neurological: She is alert and oriented to person, place, and time.  Psychiatric: She has a normal mood and affect.  Vitals reviewed.   Lab Results  Component Value Date   WBC 10.3 02/15/2015   HGB 14.4 02/15/2015   HCT 44.1 02/15/2015   PLT 289.0 02/15/2015   GLUCOSE 82 02/15/2015   CHOL 185 09/14/2014   TRIG 123.0 09/14/2014   HDL 62.00 09/14/2014   LDLCALC 98 09/14/2014   ALT 17 09/14/2014   AST 19 09/14/2014   NA 137 02/15/2015   K 4.8 02/15/2015   CL 101 02/15/2015   CREATININE 0.78 02/15/2015   BUN 12 02/15/2015   CO2 31 02/15/2015   TSH 4.43 09/14/2014    Assessment & Plan:   Problem List Items Addressed This Visit    Diarrhea    New problem. Patient has not had any recent antibiotic use. Patient will need C. Difficile.       Relevant Orders   CBC   Comp Met (CMET)   New onset a-fib ([place]) - Primary    New problem. Patient with new onset A. fib with RVR. Heart rate in the 150's. Sending to the ER for urgent evaluation for labs, rate control.        Other Visit Diagnoses    Frequency of urination  Relevant Orders    POCT Urinalysis Dipstick (Completed)      Follow-up: After hospital visit  [place]

## 2015-06-08 NOTE — Assessment & Plan Note (Signed)
New problem. Patient has not had any recent antibiotic use. Patient will need C. Difficile.

## 2015-06-08 NOTE — Progress Notes (Signed)
Pt c/o recurrent diarrhea this shift. MD Dr. Jannifer Franklin notified, orders for Imodium given. RN will administer and continue to monitor. Rachael Fee, RN

## 2015-06-08 NOTE — Assessment & Plan Note (Signed)
New problem. Patient with new onset A. fib with RVR. Heart rate in the 150's. Sending to the ER for urgent evaluation for labs, rate control.

## 2015-06-08 NOTE — Progress Notes (Signed)
Pt. admitted to unit, [id number] from ED, report from [place], [place]. Oriented to room, call bell, Ascom phones and staff. Bed in low position. Fall safety plan reviewed, contract signed and placed on wall, brown non-skid socks in place. Full assessment to Epic; skin assessed with Thomas Hoff, RN. Telemetry box verified with tele clerk and Jerl Mina, [place]: [phone] . Will continue to monitor.

## 2015-06-08 NOTE — ED Provider Notes (Signed)
[facility] Emergency Department Provider Note  ____________________________________________  Time seen: 3:30 PM  I have reviewed the triage vital signs and the nursing notes.   HISTORY  Chief Complaint Tachycardia    HPI Bridget Briggs is a 82 y.o. female sent to the ED by primary care for new onset A. fib with RVR. No prior history of A. fib. The patient has had a diarrheal illness over the last 3-4 days which was initially very severe with approximately 20 loose bowel movements in a day, but now seems to be improving. No vomiting but has had some nausea and severe generalized weakness. Denies chest pain shortness of breath palpitations dizziness or syncope. Compliant with her medications.     Past Medical History  Diagnosis Date  . GERD (gastroesophageal reflux disease)   . Arthritis   . Depression   . History of chicken pox   . Glaucoma   . Hx: UTI (urinary tract infection)   . History of colon polyps      Patient Active Problem List   Diagnosis Date Noted  . Diarrhea 06/08/2015  . New onset a-fib ([place]) 06/08/2015  . Left hip pain 04/05/2015  . Chest pressure 02/15/2015  . Pain of left thumb 08/06/2014  . Health care maintenance 08/06/2014  . Osteoporosis 07/28/2014  . Bronchiectasis without acute exacerbation ([place]) 01/20/2014  . Multiple pulmonary nodules 01/20/2014  . Abnormal CXR 11/02/2013  . Chest pain 11/01/2013  . Fatigue 11/01/2013  . Burning sensation in lower extremity 11/01/2013  . Environmental allergies 04/19/2013  . Arthritis 01/18/2013  . Depression 01/18/2013  . GERD (gastroesophageal reflux disease) 01/18/2013  . Glaucoma 01/18/2013  . History of colonic polyps 01/18/2013  . Carotid stenosis 01/18/2013     Past Surgical History  Procedure Laterality Date  . Cholecystectomy  1996  . Appendectomy  1974  . Tonsillectomy and adenoidectomy  1940  . Breast surgery Right 1988    biopsy  . Abdominal hysterectomy   1974    left ovary removed  . Ankle surgery Left 1998  . Nissen fundoplication  99991111  . Eye surgery Bilateral 2005/2006    cataract  . Laparoscopic gastric banding with hiatal hernia repair  2000     Current Outpatient Rx  Name  Route  Sig  Dispense  Refill  . beta carotene w/minerals (OCUVITE) tablet   Oral   Take 1 tablet by mouth daily.         . calcium-vitamin D (OSCAL WITH D) 500-200 MG-UNIT per tablet   Oral   Take 1 tablet by mouth daily.         . diphenhydramine-acetaminophen (TYLENOL PM) 25-500 MG TABS   Oral   Take 1 tablet by mouth at bedtime as needed.         . dorzolamide-timolol (COSOPT) 22.3-6.8 MG/ML ophthalmic solution   Both Eyes   Place 1 drop into both eyes 2 (two) times daily.         . fluocinolone (SYNALAR) 0.01 % external solution   Topical   Apply topically 2 (two) times daily as needed.         . latanoprost (XALATAN) 0.005 % ophthalmic solution   Both Eyes   Place 1 drop into both eyes at bedtime.         . vitamin D, CHOLECALCIFEROL, 400 UNITS tablet   Oral   Take 400 Units by mouth daily.  Allergies Penicillins   Family History  Problem Relation Age of Onset  . Arthritis Mother   . Leukemia Mother   . Lung cancer Father   . Osteoporosis Other     Social History Social History  Substance Use Topics  . Smoking status: Former Smoker -- 1.00 packs/day for 30 years    Types: Cigarettes    Quit date: 05/20/1988  . Smokeless tobacco: Never Used  . Alcohol Use: No    Review of Systems  Constitutional:   No fever or chills. No weight changes Eyes:   No blurry vision or double vision.  ENT:   No sore throat. Cardiovascular:   No chest pain. Respiratory:   No dyspnea or cough. Gastrointestinal:   Negative for abdominal pain, vomiting and diarrhea.  No BRBPR or melena. Genitourinary:   Negative for dysuria, urinary retention, bloody urine, or difficulty urinating. Musculoskeletal:   Negative for back  pain. No joint swelling or pain. Skin:   Negative for rash. Neurological:   Negative for headaches, focal weakness or numbness. Psychiatric:  No anxiety or depression.   Endocrine:  No hot/cold intolerance, positive decreased energy, no changes in sleep  10-point ROS otherwise negative.  ____________________________________________   PHYSICAL EXAM:  VITAL SIGNS: ED Triage Vitals  Enc Vitals Group     BP 06/08/15 1528 125/108 mmHg     Pulse Rate 06/08/15 1528 155     Resp 06/08/15 1528 15     Temp 06/08/15 1528 98.6 F (37 C)     Temp Source 06/08/15 1528 Oral     SpO2 06/08/15 1528 98 %     Weight 06/08/15 1528 181 lb 1 oz (82.129 kg)     Height 06/08/15 1528 5\' 4"  (1.626 m)     Head Cir --      Peak Flow --      Pain Score --      Pain Loc --      Pain Edu? --      Excl. in [place]? --     Vital signs reviewed, nursing assessments reviewed.   Constitutional:   Alert and oriented. Well appearing and in no distress. Eyes:   No scleral icterus. No conjunctival pallor. PERRL. EOMI ENT   Head:   Normocephalic and atraumatic.   Nose:   No congestion/rhinnorhea. No septal hematoma   Mouth/Throat:   MMM, no pharyngeal erythema. No peritonsillar mass. No uvula shift.   Neck:   No stridor. No SubQ emphysema. No meningismus. Hematological/Lymphatic/Immunilogical:   No cervical lymphadenopathy. Cardiovascular:   Irregularly irregular rhythm, heart rate 150. Normal and symmetric distal pulses are present in all extremities. No murmurs, rubs, or gallops. Respiratory:   Normal respiratory effort without tachypnea nor retractions. Breath sounds are clear and equal bilaterally. No wheezes/rales/rhonchi. Gastrointestinal:   Soft and nontender. No distention. There is no CVA tenderness.  No rebound, rigidity, or guarding. Genitourinary:   deferred Musculoskeletal:   Nontender with normal range of motion in all extremities. No joint effusions.  No lower extremity tenderness.  No  edema. Neurologic:   Normal speech and language.  CN 2-10 normal. Motor grossly intact. No pronator drift.  Normal gait. No gross focal neurologic deficits are appreciated.  Skin:    Skin is warm, dry and intact. No rash noted.  No petechiae, purpura, or bullae. Psychiatric:   Mood and affect are normal. Speech and behavior are normal. Patient exhibits appropriate insight and judgment.  ____________________________________________    LABS (pertinent positives/negatives) (  all labs ordered are listed, but only abnormal results are displayed) Labs Reviewed  COMPREHENSIVE METABOLIC PANEL - Abnormal; Notable for the following:    Sodium 132 (*)    Potassium 3.2 (*)    Chloride 100 (*)    CO2 21 (*)    Glucose, Bld 129 (*)    GFR calc non Af Amer 54 (*)    All other components within normal limits  CBC WITH DIFFERENTIAL/PLATELET - Abnormal; Notable for the following:    Lymphs Abs 0.9 (*)    Monocytes Absolute 1.8 (*)    All other components within normal limits  PHOSPHORUS - Abnormal; Notable for the following:    Phosphorus 2.1 (*)    All other components within normal limits  LIPASE, BLOOD  MAGNESIUM  TSH   ____________________________________________   EKG  Interpreted by me Atrial fibrillation rate 126, normal axis intervals QRS and ST segments and T waves  ____________________________________________    RADIOLOGY  Chest x-ray unremarkable  ____________________________________________   PROCEDURES CRITICAL CARE Performed by: Joni Fears, Adda Stokes   Total critical care time: 35 minutes  Critical care time was exclusive of separately billable procedures and treating other patients.  Critical care was necessary to treat or prevent imminent or life-threatening deterioration.  Critical care was time spent personally by me on the following activities: development of treatment plan with patient and/or surrogate as well as nursing, discussions with consultants,  evaluation of patient's response to treatment, examination of patient, obtaining history from patient or surrogate, ordering and performing treatments and interventions, ordering and review of laboratory studies, ordering and review of radiographic studies, pulse oximetry and re-evaluation of patient's condition.   ____________________________________________   INITIAL IMPRESSION / ASSESSMENT AND PLAN / ED COURSE  Pertinent labs & imaging results that were available during my care of the patient were reviewed by me and considered in my medical decision making (see chart for details).  Patient presents with A. fib with RVR. Blood pressure currently stable and no evidence of end organ hypoperfusion. IV diltiazem ordered while checking labs for an apparent source. Suspect electrolyte abnormality with her severe diarrheal illness recently.  ----------------------------------------- 5:14 PM on 06/08/2015 -----------------------------------------  Workup reveals hypokalemia. Normal magnesium, normal TSH. We'll give oral potassium. Continue IV fluids. Discussed with hospitalist for admission.     ____________________________________________   FINAL CLINICAL IMPRESSION(S) / ED DIAGNOSES  Final diagnoses:  New onset atrial fibrillation ([place])  Atrial fibrillation with rapid ventricular response ([facility])  Diarrhea of presumed infectious origin      Carrie Mew, MD 06/08/15 1714

## 2015-06-08 NOTE — H&P (Signed)
[place] at [place] NAME: Bridget Briggs    MR#:  [id number]  DATE OF BIRTH:  1931-01-18  DATE OF ADMISSION:  06/08/2015  PRIMARY CARE PHYSICIAN: Einar Pheasant, MD   REQUESTING/REFERRING PHYSICIAN: Dr. Joni Fears  CHIEF COMPLAINT:   Chief Complaint  Patient presents with  . Tachycardia    HISTORY OF PRESENT ILLNESS:  Bridget Briggs  is a 82 y.o. female with a known history of Glaucoma here with Afib. Patient was seen at PCP office for diarrhea of 4 days and weakness. Found to have Afib with HR 150s and sent to ED thru EMS. Here she received cardizem 10mg  IV x 1. HR still into 130s and. She has some chronic chest pressure which is same. Had normal ST and echo with Dr. Clayborn Bigness last year.  PAST MEDICAL HISTORY:   Past Medical History  Diagnosis Date  . GERD (gastroesophageal reflux disease)   . Arthritis   . Depression   . History of chicken pox   . Glaucoma   . Hx: UTI (urinary tract infection)   . History of colon polyps     PAST SURGICAL HISTORY:   Past Surgical History  Procedure Laterality Date  . Cholecystectomy  1996  . Appendectomy  1974  . Tonsillectomy and adenoidectomy  1940  . Breast surgery Right 1988    biopsy  . Abdominal hysterectomy  1974    left ovary removed  . Ankle surgery Left 1998  . Nissen fundoplication  99991111  . Eye surgery Bilateral 2005/2006    cataract  . Laparoscopic gastric banding with hiatal hernia repair  2000    SOCIAL HISTORY:   Social History  Substance Use Topics  . Smoking status: Former Smoker -- 1.00 packs/day for 30 years    Types: Cigarettes    Quit date: 05/20/1988  . Smokeless tobacco: Never Used  . Alcohol Use: No    FAMILY HISTORY:   Family History  Problem Relation Age of Onset  . Arthritis Mother   . Leukemia Mother   . Lung cancer Father   . Osteoporosis Other     DRUG ALLERGIES:   Allergies  Allergen Reactions  . Penicillins Other (See  Comments)    Has patient had a PCN reaction causing immediate rash, facial/tongue/throat swelling, SOB or lightheadedness with hypotension: no  Has patient had a PCN reaction causing severe rash involving mucus membranes or skin necrosis: no Has patient had a PCN reaction that required hospitalization: no Has patient had a PCN reaction occurring within the last 10 years: yes  If all of the above answers are "NO", then may proceed with Cephalosporin use.     REVIEW OF SYSTEMS:   Review of Systems  Constitutional: Positive for malaise/fatigue. Negative for fever and chills.  HENT: Negative for sore throat.   Eyes: Negative for blurred vision, double vision and pain.  Respiratory: Negative for cough, hemoptysis, shortness of breath and wheezing.   Cardiovascular: Positive for chest pain. Negative for palpitations, orthopnea and leg swelling.  Gastrointestinal: Positive for nausea and diarrhea. Negative for heartburn, vomiting, abdominal pain and constipation.  Genitourinary: Negative for dysuria and hematuria.  Musculoskeletal: Negative for back pain and joint pain.  Skin: Negative for rash.  Neurological: Negative for sensory change, speech change, focal weakness and headaches.  Endo/Heme/Allergies: Does not bruise/bleed easily.  Psychiatric/Behavioral: Negative for depression. The patient is not nervous/anxious.     MEDICATIONS AT HOME:   Prior to Admission medications  Medication Sig Start Date End Date Taking? Authorizing Provider  beta carotene w/minerals (OCUVITE) tablet Take 1 tablet by mouth daily.   Yes Historical Provider, MD  calcium-vitamin D (OSCAL WITH D) 500-200 MG-UNIT per tablet Take 1 tablet by mouth daily.   Yes Historical Provider, MD  diphenhydramine-acetaminophen (TYLENOL PM) 25-500 MG TABS Take 1 tablet by mouth at bedtime as needed.   Yes Historical Provider, MD  dorzolamide-timolol (COSOPT) 22.3-6.8 MG/ML ophthalmic solution Place 1 drop into both eyes 2 (two)  times daily.   Yes Historical Provider, MD  fluocinolone (SYNALAR) 0.01 % external solution Apply topically 2 (two) times daily as needed.   Yes Historical Provider, MD  latanoprost (XALATAN) 0.005 % ophthalmic solution Place 1 drop into both eyes at bedtime.   Yes Historical Provider, MD  vitamin D, CHOLECALCIFEROL, 400 UNITS tablet Take 400 Units by mouth daily.   Yes Historical Provider, MD      VITAL SIGNS:  Blood pressure 167/146, pulse 109, temperature 98.6 F (37 C), temperature source Oral, resp. rate 17, height 5\' 4"  (1.626 m), weight 82.129 kg (181 lb 1 oz), SpO2 80 %.  PHYSICAL EXAMINATION:  Physical Exam  GENERAL:  82 y.o.-year-old patient lying in the bed with no acute distress.  EYES: Pupils equal, round, reactive to light and accommodation. No scleral icterus. Extraocular muscles intact.  HEENT: Head atraumatic, normocephalic. Oropharynx and nasopharynx clear. No oropharyngeal erythema, moist oral mucosa  NECK:  Supple, no jugular venous distention. No thyroid enlargement, no tenderness.  LUNGS: Normal breath sounds bilaterally, no wheezing, rales, rhonchi. No use of accessory muscles of respiration.  CARDIOVASCULAR: S1, S2 . No murmurs, rubs, or gallops. Tachycardia, irregular ABDOMEN: Soft, nontender, nondistended. Bowel sounds present. No organomegaly or mass.  EXTREMITIES: No pedal edema, cyanosis, or clubbing. + 2 pedal & radial pulses b/l.   NEUROLOGIC: Cranial nerves II through XII are intact. No focal Motor or sensory deficits appreciated b/l PSYCHIATRIC: The patient is alert and oriented x 3. Good affect.  SKIN: No obvious rash, lesion, or ulcer.   LABORATORY PANEL:   CBC  Recent Labs Lab 06/08/15 1526  WBC 7.3  HGB 14.4  HCT 43.4  PLT 174   ------------------------------------------------------------------------------------------------------------------  Chemistries   Recent Labs Lab 06/08/15 1526  NA 132*  K 3.2*  CL 100*  CO2 21*  GLUCOSE  129*  BUN 15  CREATININE 0.95  CALCIUM 9.3  MG 2.0  AST 26  ALT 22  ALKPHOS 55  BILITOT 0.8   ------------------------------------------------------------------------------------------------------------------  Cardiac Enzymes No results for input(s): TROPONINI in the last 168 hours. ------------------------------------------------------------------------------------------------------------------  RADIOLOGY:  Dg Chest 2 View  06/08/2015  CLINICAL DATA:  Tachycardia and atrial fibrillation EXAM: CHEST  2 VIEW COMPARISON:  Chest radiograph February 06, 2015; chest CT February 16, 2015 FINDINGS: There is scarring in the left base. The interstitium overall is somewhat thickened but stable. There is mild lung hyperexpansion. There is no edema or consolidation. The heart size and pulmonary vascular normal. There is atherosclerotic calcification aorta. No demonstrable adenopathy. There is mild degenerative change in the thoracic spine. Scarring. IMPRESSION: Scarring left base. Lungs somewhat hyperexpanded with interstitial thickening. No frank edema or consolidation. Electronically Signed   By: Lowella Grip III M.D.   On: 06/08/2015 15:50     IMPRESSION AND PLAN:   * Afib with RVR, New onset < 48 hrs Normal TSH Troponin normal. Serial troponins. STAT Lopressor IV 5 MG. Start Lopressor 50mg  BID. Cardizem IV PRN. Consult cardiology. Check  Echo. Start therapeutic lovenox in case she needs cardioversion. With no co morbidities cardioversion would help if she doesn't convert to NSR. 1 liter IVF bolus given in ED.  * Elevated BP without diagnosis of HTN Started metoprolol  * Hypokalemia and hyponatremia due to diarrhea - replace PO/IV  * Diarrhea More formed and last BM a few hrs back Improving Normal WBC, Afebrile, No abd tenderness.  * DVT prophylaxis On lovenox for Afib   All the records are reviewed and case discussed with ED provider. Management plans discussed with the  patient, family and they are in agreement.  CODE STATUS: FULL  TOTAL CC TIME TAKING CARE OF THIS PATIENT: 40 minutes.    Hillary Bow R M.D on 06/08/2015 at 5:35 PM  Between 7am to 6pm - Pager - [phone]  After 6pm go to [email or web address] - password [email or web address] [facility]  Office  [phone]  CC: Primary care physician; Einar Pheasant, MD     Note: This dictation was prepared with Dragon dictation along with smaller phrase technology. Any transcriptional errors that result from this process are unintentional.

## 2015-06-08 NOTE — ED Notes (Signed)
MD Sudini at bedside.

## 2015-06-08 NOTE — ED Notes (Signed)
Pt arrived via EMS from PCP for tachycardia. PCP reports Afib RVR. Pt has HR of 140 upon arrival to ED. Pt went to PCP due to dehydration and diarrhea since Monday. PT reports having 21 bowel movements yesterday. Pt also states diarrhea has improved since then. Pt reports only other symptoms have been nausea and weakness.

## 2015-06-09 ENCOUNTER — Inpatient Hospital Stay
Admit: 2015-06-09 | Discharge: 2015-06-09 | Disposition: A | Discharge status: Home / Self Care | Payer: Medicare Other | Attending: Internal Medicine | Admitting: Internal Medicine

## 2015-06-09 LAB — TROPONIN I

## 2015-06-09 LAB — BASIC METABOLIC PANEL
ANION GAP: 6 (ref 5–15)
BUN: 11 mg/dL (ref 6–20)
CALCIUM: 8.1 mg/dL — AB (ref 8.9–10.3)
CO2: 23 mmol/L (ref 22–32)
Chloride: 99 mmol/L — ABNORMAL LOW (ref 101–111)
Creatinine, Ser: 0.98 mg/dL (ref 0.44–1.00)
GFR, EST NON AFRICAN AMERICAN: 52 mL/min — AB (ref >60)
GLUCOSE: 105 mg/dL — AB (ref 65–99)
POTASSIUM: 3.3 mmol/L — AB (ref 3.5–5.1)
SODIUM: 128 mmol/L — AB (ref 135–145)

## 2015-06-09 LAB — CLOSTRIDIUM DIFFICILE BY PCR: Toxigenic C. Difficile by PCR: POSITIVE — AB

## 2015-06-09 LAB — C DIFFICILE QUICK SCREEN W PCR REFLEX
C DIFFICILE (CDIFF) TOXIN: NEGATIVE
C DIFFICLE (CDIFF) ANTIGEN: POSITIVE — AB

## 2015-06-09 LAB — MAGNESIUM: Magnesium: 1.9 mg/dL (ref 1.7–2.4)

## 2015-06-09 MED ORDER — LOPERAMIDE HCL 2 MG PO CAPS
2.0000 mg | ORAL_CAPSULE | Freq: Four times a day (QID) | ORAL | Status: DC | PRN
  Administered 2015-06-09: 2 mg via ORAL
  Filled 2015-06-09: qty 1

## 2015-06-09 MED ORDER — POTASSIUM CHLORIDE CRYS ER 20 MEQ PO TBCR
40.0000 meq | EXTENDED_RELEASE_TABLET | Freq: Once | ORAL | Status: AC
  Administered 2015-06-09: 40 meq via ORAL
  Filled 2015-06-09: qty 2

## 2015-06-09 MED ORDER — SODIUM CHLORIDE 0.9 % IV SOLN
INTRAVENOUS | Status: AC
  Administered 2015-06-09: 08:00:00 via INTRAVENOUS

## 2015-06-09 MED ORDER — METRONIDAZOLE 500 MG PO TABS
500.0000 mg | ORAL_TABLET | Freq: Three times a day (TID) | ORAL | Status: DC
  Administered 2015-06-09 – 2015-06-10 (×2): 500 mg via ORAL
  Filled 2015-06-09 (×2): qty 1

## 2015-06-09 MED ORDER — METOPROLOL TARTRATE 25 MG PO TABS
25.0000 mg | ORAL_TABLET | Freq: Two times a day (BID) | ORAL | Status: DC
  Administered 2015-06-10: 25 mg via ORAL
  Filled 2015-06-09: qty 1

## 2015-06-09 NOTE — Progress Notes (Signed)
[place] at [place] NAME: Bridget Briggs    MR#:  [id number]  DATE OF BIRTH:  01-Apr-1931  SUBJECTIVE:  CHIEF COMPLAINT:   Chief Complaint  Patient presents with  . Tachycardia   Diarrhea once this morning and generalized weakness.   REVIEW OF SYSTEMS:  CONSTITUTIONAL: No fever, has weakness.  EYES: No blurred or double vision.  EARS, NOSE, AND THROAT: No tinnitus or ear pain.  RESPIRATORY: No cough, shortness of breath, wheezing or hemoptysis.  CARDIOVASCULAR: No chest pain, orthopnea, edema.  GASTROINTESTINAL: No nausea, vomiting, diarrhea or abdominal pain.  GENITOURINARY: No dysuria, hematuria.  ENDOCRINE: No polyuria, nocturia,  HEMATOLOGY: No anemia, easy bruising or bleeding SKIN: No rash or lesion. MUSCULOSKELETAL: No joint pain or arthritis.   NEUROLOGIC: No tingling, numbness, weakness.  PSYCHIATRY: No anxiety or depression.   DRUG ALLERGIES:   Allergies  Allergen Reactions  . Penicillins Other (See Comments)    Has patient had a PCN reaction causing immediate rash, facial/tongue/throat swelling, SOB or lightheadedness with hypotension: no  Has patient had a PCN reaction causing severe rash involving mucus membranes or skin necrosis: no Has patient had a PCN reaction that required hospitalization: no Has patient had a PCN reaction occurring within the last 10 years: yes  If all of the above answers are "NO", then may proceed with Cephalosporin use.     VITALS:  Blood pressure 107/54, pulse 56, temperature 98.4 F (36.9 C), temperature source Oral, resp. rate 16, height 5\' 4"  (1.626 m), weight 81.738 kg (180 lb 3.2 oz), SpO2 94 %.  PHYSICAL EXAMINATION:  GENERAL:  82 y.o.-year-old patient lying in the bed with no acute distress.  EYES: Pupils equal, round, reactive to light and accommodation. No scleral icterus. Extraocular muscles intact.  HEENT: Head atraumatic, normocephalic. Oropharynx and nasopharynx  clear.  NECK:  Supple, no jugular venous distention. No thyroid enlargement, no tenderness.  LUNGS: Normal breath sounds bilaterally, no wheezing, rales,rhonchi or crepitation. No use of accessory muscles of respiration.  CARDIOVASCULAR: S1, S2 normal. No murmurs, rubs, or gallops.  ABDOMEN: Soft, nontender, nondistended. Bowel sounds present. No organomegaly or mass.  EXTREMITIES: No pedal edema, cyanosis, or clubbing.  NEUROLOGIC: Cranial nerves II through XII are intact. Muscle strength 5/5 in all extremities. Sensation intact. Gait not checked.  PSYCHIATRIC: The patient is alert and oriented x 3.  SKIN: No obvious rash, lesion, or ulcer.    LABORATORY PANEL:   CBC  Recent Labs Lab 06/08/15 1526  WBC 7.3  HGB 14.4  HCT 43.4  PLT 174   ------------------------------------------------------------------------------------------------------------------  Chemistries   Recent Labs Lab 06/08/15 1526 06/09/15 0419  NA 132* 128*  K 3.2* 3.3*  CL 100* 99*  CO2 21* 23  GLUCOSE 129* 105*  BUN 15 11  CREATININE 0.95 0.98  CALCIUM 9.3 8.1*  MG 2.0 1.9  AST 26  --   ALT 22  --   ALKPHOS 55  --   BILITOT 0.8  --    ------------------------------------------------------------------------------------------------------------------  Cardiac Enzymes  Recent Labs Lab 06/09/15 0419  TROPONINI <0.03   ------------------------------------------------------------------------------------------------------------------  RADIOLOGY:  Dg Chest 2 View  06/08/2015  CLINICAL DATA:  Tachycardia and atrial fibrillation EXAM: CHEST  2 VIEW COMPARISON:  Chest radiograph February 06, 2015; chest CT February 16, 2015 FINDINGS: There is scarring in the left base. The interstitium overall is somewhat thickened but stable. There is mild lung hyperexpansion. There is no edema or consolidation. The heart size  and pulmonary vascular normal. There is atherosclerotic calcification aorta. No demonstrable  adenopathy. There is mild degenerative change in the thoracic spine. Scarring. IMPRESSION: Scarring left base. Lungs somewhat hyperexpanded with interstitial thickening. No frank edema or consolidation. Electronically Signed   By: Lowella Grip III M.D.   On: 06/08/2015 15:50    EKG:   Orders placed or performed during the hospital encounter of 06/08/15  . ED EKG within 10 minutes  . ED EKG within 10 minutes    ASSESSMENT AND PLAN:   * Afib with RVR, New onset. Rate is controlled today. She was treated with Lopressor IV 5 MG. Started Lopressor 50mg  BID. Cardizem IV PRN. on therapeutic lovenox, continue aspirin. Follow-up cardiology and Echo.  * Elevated BP without diagnosis of HTN Started metoprolol, but BP is in low side today. Decrease the dose of lopressor 25 mg by mouth twice a day.  * Hypokalemia and hyponatremia due to diarrhea, Start normal saline IV. Given potassium supplement and follow-up BMP.  * Diarrhea Still diarrhea, check stool C. difficile test.     All the records are reviewed and case discussed with Care Management/Social Workerr. Management plans discussed with the patient, family and they are in agreement.  CODE STATUS: Full code  TOTAL TIME TAKING CARE OF THIS PATIENT: 37 minutes.  Greater than 50% time was spent on coordination of care and face-to-face counseling.  POSSIBLE D/C IN 2 DAYS, DEPENDING ON CLINICAL CONDITION.   Demetrios Loll M.D on 06/09/2015 at 3:37 PM  Between 7am to 6pm - Pager - [phone]  After 6pm go to [email or web address] - password [email or web address] [facility]  Office  [phone]  CC: Primary care physician; Einar Pheasant, MD

## 2015-06-09 NOTE — Progress Notes (Signed)
MD, Dr. Darvin Neighbours notified of low diastolic pressure.  122/38 HR 75.  MD ordered to hold night time dose of metoprolol Jessee Avers

## 2015-06-09 NOTE — Consult Note (Signed)
Reason for Consult: atrial fibrillation RVR Referring Physician:  Charlene D Scott MD  Dr. [facility]  [facility]ospital was  Bridget Briggs is an 82 y.o. female.  HPI:  82-year-old female the history of glaucoma recently developed diarrhea over the past week . Patient has had palpitations tachycardia was found have elevated heart rate at 150.  Patient was treated with Cardizem to increase for rate control. Patient states she has had recent cardiac workup including echocardiogram and Myoview which were both reasonably normal. Patient denies over the counter cold medications she states her thyroid disease reasonably normal. Denies much in way of significant chest pain here for evaluation. Patient was seen in the PMD  Office by Dr. Cook  because a tachycardic rate.  She was sent to the emergency room for evaluation  Past Medical History  Diagnosis Date  . GERD (gastroesophageal reflux disease)   . Arthritis   . Depression   . History of chicken pox   . Glaucoma   . Hx: UTI (urinary tract infection)   . History of colon polyps     Past Surgical History  Procedure Laterality Date  . Cholecystectomy  1996  . Appendectomy  1974  . Tonsillectomy and adenoidectomy  1940  . Breast surgery Right 1988    biopsy  . Abdominal hysterectomy  1974    left ovary removed  . Ankle surgery Left 1998  . Nissen fundoplication  1996  . Eye surgery Bilateral 2005/2006    cataract  . Laparoscopic gastric banding with hiatal hernia repair  2000    Family History  Problem Relation Age of Onset  . Arthritis Mother   . Leukemia Mother   . Lung cancer Father   . Osteoporosis Other     Social History:  reports that she quit smoking about 27 years ago. Her smoking use included Cigarettes. She has a 30 pack-year smoking history. She has never used smokeless tobacco. She reports that she does not drink alcohol or use illicit drugs.  Allergies:  Allergies  Allergen Reactions  . Penicillins Other (See Comments)   Has patient had a PCN reaction causing immediate rash, facial/tongue/throat swelling, SOB or lightheadedness with hypotension: no  Has patient had a PCN reaction causing severe rash involving mucus membranes or skin necrosis: no Has patient had a PCN reaction that required hospitalization: no Has patient had a PCN reaction occurring within the last 10 years: yes  If all of the above answers are "NO", then may proceed with Cephalosporin use.     Medications: I have reviewed the patient's current medications.  Results for orders placed or performed during the hospital encounter of 06/08/15 (from the past 48 hour(s))  Comprehensive metabolic panel     Status: Abnormal   Collection Time: 06/08/15  3:26 PM  Result Value Ref Range   Sodium 132 (L) 135 - 145 mmol/L   Potassium 3.2 (L) 3.5 - 5.1 mmol/L   Chloride 100 (L) 101 - 111 mmol/L   CO2 21 (L) 22 - 32 mmol/L   Glucose, Bld 129 (H) 65 - 99 mg/dL   BUN 15 6 - 20 mg/dL   Creatinine, Ser 0.95 0.44 - 1.00 mg/dL   Calcium 9.3 8.9 - 10.3 mg/dL   Total Protein 6.9 6.5 - 8.1 g/dL   Albumin 3.7 3.5 - 5.0 g/dL   AST 26 15 - 41 U/L   ALT 22 14 - 54 U/L   Alkaline Phosphatase 55 38 - 126 U/L   Total   Bilirubin 0.8 0.3 - 1.2 mg/dL   GFR calc non Af Amer 54 (L) >60 mL/min   GFR calc Af Amer >60 >60 mL/min    Comment: (NOTE) The eGFR has been calculated using the CKD EPI equation. This calculation has not been validated in all clinical situations. eGFR's persistently <60 mL/min signify possible Chronic Kidney Disease.    Anion gap 11 5 - 15  Lipase, blood     Status: None   Collection Time: 06/08/15  3:26 PM  Result Value Ref Range   Lipase 20 11 - 51 U/L  CBC with Differential     Status: Abnormal   Collection Time: 06/08/15  3:26 PM  Result Value Ref Range   WBC 7.3 3.6 - 11.0 K/uL   RBC 5.16 3.80 - 5.20 MIL/uL   Hemoglobin 14.4 12.0 - 16.0 g/dL   HCT 43.4 35.0 - 47.0 %   MCV 84.2 80.0 - 100.0 fL   MCH 27.9 26.0 - 34.0 pg   MCHC  33.2 32.0 - 36.0 g/dL   RDW 13.9 11.5 - 14.5 %   Platelets 174 150 - 440 K/uL   Neutrophils Relative % 62 %   Neutro Abs 4.5 1.4 - 6.5 K/uL   Lymphocytes Relative 13 %   Lymphs Abs 0.9 (L) 1.0 - 3.6 K/uL   Monocytes Relative 25 %   Monocytes Absolute 1.8 (H) 0.2 - 0.9 K/uL   Eosinophils Relative 0 %   Eosinophils Absolute 0.0 0 - 0.7 K/uL   Basophils Relative 0 %   Basophils Absolute 0.0 0 - 0.1 K/uL  Magnesium     Status: None   Collection Time: 06/08/15  3:26 PM  Result Value Ref Range   Magnesium 2.0 1.7 - 2.4 mg/dL  Phosphorus     Status: Abnormal   Collection Time: 06/08/15  3:26 PM  Result Value Ref Range   Phosphorus 2.1 (L) 2.5 - 4.6 mg/dL  TSH     Status: None   Collection Time: 06/08/15  3:26 PM  Result Value Ref Range   TSH 3.186 0.350 - 4.500 uIU/mL  Troponin I (q 6hr x 3)     Status: Abnormal   Collection Time: 06/08/15 10:18 PM  Result Value Ref Range   Troponin I 0.06 (H) <0.031 ng/mL    Comment: READ BACK AND VERIFIED WITH KATHY STEWART AT 2352 06/08/15.PMH        PERSISTENTLY INCREASED TROPONIN VALUES IN THE RANGE OF 0.04-0.49 ng/mL CAN BE SEEN IN:       -UNSTABLE ANGINA       -CONGESTIVE HEART FAILURE       -MYOCARDITIS       -CHEST TRAUMA       -ARRYHTHMIAS       -LATE PRESENTING MYOCARDIAL INFARCTION       -COPD   CLINICAL FOLLOW-UP RECOMMENDED.   Basic metabolic panel     Status: Abnormal   Collection Time: 06/09/15  4:19 AM  Result Value Ref Range   Sodium 128 (L) 135 - 145 mmol/L   Potassium 3.3 (L) 3.5 - 5.1 mmol/L   Chloride 99 (L) 101 - 111 mmol/L   CO2 23 22 - 32 mmol/L   Glucose, Bld 105 (H) 65 - 99 mg/dL   BUN 11 6 - 20 mg/dL   Creatinine, Ser 0.98 0.44 - 1.00 mg/dL   Calcium 8.1 (L) 8.9 - 10.3 mg/dL   GFR calc non Af Amer 52 (L) >60 mL/min   GFR calc   Af Amer >60 >60 mL/min    Comment: (NOTE) The eGFR has been calculated using the CKD EPI equation. This calculation has not been validated in all clinical situations. eGFR's  persistently <60 mL/min signify possible Chronic Kidney Disease.    Anion gap 6 5 - 15  Troponin I (q 6hr x 3)     Status: None   Collection Time: 06/09/15  4:19 AM  Result Value Ref Range   Troponin I <0.03 <0.031 ng/mL    Comment:        NO INDICATION OF MYOCARDIAL INJURY.   Magnesium     Status: None   Collection Time: 06/09/15  4:19 AM  Result Value Ref Range   Magnesium 1.9 1.7 - 2.4 mg/dL    Dg Chest 2 View  06/08/2015  CLINICAL DATA:  Tachycardia and atrial fibrillation EXAM: CHEST  2 VIEW COMPARISON:  Chest radiograph February 06, 2015; chest CT February 16, 2015 FINDINGS: There is scarring in the left base. The interstitium overall is somewhat thickened but stable. There is mild lung hyperexpansion. There is no edema or consolidation. The heart size and pulmonary vascular normal. There is atherosclerotic calcification aorta. No demonstrable adenopathy. There is mild degenerative change in the thoracic spine. Scarring. IMPRESSION: Scarring left base. Lungs somewhat hyperexpanded with interstitial thickening. No frank edema or consolidation. Electronically Signed   By: Lowella Grip III M.D.   On: 06/08/2015 15:50    Review of Systems  Constitutional: Positive for malaise/fatigue.  HENT: Negative.   Eyes: Negative.   Respiratory: Negative.   Cardiovascular: Positive for chest pain and palpitations.  Gastrointestinal: Positive for diarrhea.  Genitourinary: Negative.   Musculoskeletal: Negative.   Skin: Negative.   Neurological: Positive for weakness.  Endo/Heme/Allergies: Negative.   Psychiatric/Behavioral: Negative.    Blood pressure 107/54, pulse 56, temperature 98.4 F (36.9 C), temperature source Oral, resp. rate 16, height 5' 4" (1.626 m), weight 81.738 kg (180 lb 3.2 oz), SpO2 94 %. Physical Exam  Constitutional: She appears well-developed and well-nourished.  HENT:  Head: Normocephalic and atraumatic.  Eyes: Conjunctivae and EOM are normal. Pupils are  equal, round, and reactive to light.  Neck: Normal range of motion. Neck supple.  Cardiovascular: S1 normal, S2 normal and intact distal pulses.  An irregularly irregular rhythm present. Tachycardia present.   Murmur heard.  Systolic murmur is present with a grade of 2/6    Assessment/Plan:  atrial fibrillation rapid ventricle response  tachycardia  diarrhea  GERD  depression Fatigued  obesity . PLAN  agree with telemetry  recommend rate control   consider long-term anticoagulation with Eliquis  omeprazole for GERD  Imodium for diarrhea  consider GI evaluation  with conservative cardiac therapy at this point   CALLWOOD,DWAYNE D. 06/09/2015, 4:41 PM

## 2015-06-09 NOTE — Progress Notes (Signed)
A fib/brady. Pt reported loose stools. Stool sample was sent. Up to Br by self and tolerated it well. Room air. Takes meds ok. Pt has no pain. Pt has no further concerns at this time.

## 2015-06-09 NOTE — Progress Notes (Signed)
Pt's troponin elevated at 0.06. MD Dr. Jannifer Franklin notified, no new orders received. RN will continue to monitor. Rachael Fee, RN

## 2015-06-09 NOTE — Care Management Note (Addendum)
Case Management Note  Patient Details  Name: Bridget Briggs MRN: [id number] Date of Birth: 1930/08/28  Subjective/Objective:  RNCM assessment for discharge planning. Met with patient at bedside. New diagnosis of Afib. Patient states she lives alone. She is independent, drives and uses no assistive devices. Patient goes to the [facility] 3 times a week and walks the other days.  She denies issues obtaining medications, copays or other financial concerns.  Current with PCP, Dr. Einar Pheasant.                 Action/Plan: Will monitor progression and if new anticoagulant prescribed. No additional needs noted.   Expected Discharge Date:                  Expected Discharge Plan:  Home/Self Care  In-House Referral:     Discharge [place] not met per provider, CM Consult  Post Acute Care Choice:    Choice offered to:     DME Arranged:    DME Agency:     HH Arranged:    HH Agency:     Status of Service:  In process, will continue to follow  Medicare Important Message Given:  Yes Date Medicare IM Given:    Medicare IM give by:    Date Additional Medicare IM Given:    Additional Medicare Important Message give by:     If discussed at [place] of Stay Meetings, dates discussed:    Additional Comments:  Jolly Mango, RN 06/09/2015, 9:54 AM

## 2015-06-09 NOTE — Care Management Important Message (Signed)
Important Message  Patient Details  Name: Bridget Briggs MRN: [id number] Date of Birth: 31-May-1930   Medicare Important Message Given:  Yes    Antion Andres A, RN 06/09/2015, 8:03 AM

## 2015-06-10 LAB — BASIC METABOLIC PANEL
ANION GAP: 6 (ref 5–15)
BUN: 8 mg/dL (ref 6–20)
CHLORIDE: 109 mmol/L (ref 101–111)
CO2: 22 mmol/L (ref 22–32)
Calcium: 7.9 mg/dL — ABNORMAL LOW (ref 8.9–10.3)
Creatinine, Ser: 0.71 mg/dL (ref 0.44–1.00)
Glucose, Bld: 99 mg/dL (ref 65–99)
POTASSIUM: 3.6 mmol/L (ref 3.5–5.1)
SODIUM: 137 mmol/L (ref 135–145)

## 2015-06-10 LAB — CBC
HEMATOCRIT: 35.7 % (ref 35.0–47.0)
Hemoglobin: 11.9 g/dL — ABNORMAL LOW (ref 12.0–16.0)
MCH: 28.9 pg (ref 26.0–34.0)
MCHC: 33.4 g/dL (ref 32.0–36.0)
MCV: 86.4 fL (ref 80.0–100.0)
PLATELETS: 168 10*3/uL (ref 150–440)
RBC: 4.14 MIL/uL (ref 3.80–5.20)
RDW: 13.9 % (ref 11.5–14.5)
WBC: 5.4 10*3/uL (ref 3.6–11.0)

## 2015-06-10 MED ORDER — APIXABAN 5 MG PO TABS
5.0000 mg | ORAL_TABLET | Freq: Two times a day (BID) | ORAL | Status: DC
  Administered 2015-06-10: 5 mg via ORAL
  Filled 2015-06-10: qty 1

## 2015-06-10 MED ORDER — METOPROLOL TARTRATE 25 MG PO TABS: 25.0000 mg | ORAL_TABLET | Freq: Two times a day (BID) | ORAL | Status: DC

## 2015-06-10 MED ORDER — POTASSIUM CHLORIDE CRYS ER 20 MEQ PO TBCR
40.0000 meq | EXTENDED_RELEASE_TABLET | Freq: Once | ORAL | Status: AC
  Administered 2015-06-10: 40 meq via ORAL
  Filled 2015-06-10: qty 2

## 2015-06-10 MED ORDER — APIXABAN 5 MG PO TABS: 5.0000 mg | ORAL_TABLET | Freq: Two times a day (BID) | ORAL | Status: DC

## 2015-06-10 MED ORDER — METRONIDAZOLE 500 MG PO TABS: 500.0000 mg | ORAL_TABLET | Freq: Three times a day (TID) | ORAL | Status: DC

## 2015-06-10 NOTE — Discharge Summary (Signed)
[place] [facility] [place] NAME: Bridget Briggs    MR#:  [id number]  DATE OF BIRTH:  1930/11/16  DATE OF ADMISSION:  06/08/2015 ADMITTING PHYSICIAN: Hillary Bow, MD  DATE OF DISCHARGE: 06/10/2015 10:30 AM  PRIMARY CARE PHYSICIAN: Einar Pheasant, MD    ADMISSION DIAGNOSIS:  Diarrhea of presumed infectious origin [A09] New onset atrial fibrillation ([place]) [I48.91] Atrial fibrillation with rapid ventricular response ([place]) [I48.91]  DISCHARGE DIAGNOSIS:  Active Problems:   A-fib ([place])   SECONDARY DIAGNOSIS:   Past Medical History  Diagnosis Date  . GERD (gastroesophageal reflux disease)   . Arthritis   . Depression   . History of chicken pox   . Glaucoma   . Hx: UTI (urinary tract infection)   . History of colon polyps     HOSPITAL COURSE:   1. Atrial fibrillation with rapid ventricular response which is new onset. The patient actually converted to normal sinus rhythm. Patient is on metoprolol 25 mg twice a day for rate control. Case discussed with Dr. Clayborn Bigness cardiology and he recommended Eliquis for anticoagulation. Benefits and risks of blood thinner explained to the patient and she understands. She will follow up with Dr. Clayborn Bigness as outpatient. Atrial fibrillation may have been brought on by dehydration and electrolyte abnormalities. Chadsvasc score of 2. 2. Clostridium difficile colitis. Patient started on Flagyl 500 mg every 8 hours. A course of 14 days will be prescribed. The patient states that her diarrhea has been going on for years. 3. Hyponatremia and hypokalemia. Electrolytes improved with IV fluid hydration and potassium supplementation. Another dose of potassium will be given prior to discharge home. 4. Essential hypertension- metoprolol control blood pressure.  DISCHARGE CONDITIONS:   Satisfactory  CONSULTS OBTAINED:  Treatment Team:  Yolonda Kida, MD  DRUG ALLERGIES:   Allergies  Allergen Reactions   . Penicillins Other (See Comments)    Has patient had a PCN reaction causing immediate rash, facial/tongue/throat swelling, SOB or lightheadedness with hypotension: no  Has patient had a PCN reaction causing severe rash involving mucus membranes or skin necrosis: no Has patient had a PCN reaction that required hospitalization: no Has patient had a PCN reaction occurring within the last 10 years: yes  If all of the above answers are "NO", then may proceed with Cephalosporin use.     DISCHARGE MEDICATIONS:   Discharge Medication List as of 06/10/2015  9:31 AM    START taking these medications   Details  apixaban (ELIQUIS) 5 MG TABS tablet Take 1 tablet (5 mg total) by mouth 2 (two) times daily., Starting 06/10/2015, Until Discontinued, Print    metoprolol tartrate (LOPRESSOR) 25 MG tablet Take 1 tablet (25 mg total) by mouth 2 (two) times daily., Starting 06/10/2015, Until Discontinued, Print    metroNIDAZOLE (FLAGYL) 500 MG tablet Take 1 tablet (500 mg total) by mouth every 8 (eight) hours., Starting 06/10/2015, Until Discontinued, Print      CONTINUE these medications which have NOT CHANGED   Details  beta carotene w/minerals (OCUVITE) tablet Take 1 tablet by mouth daily., Until Discontinued, Historical Med    calcium-vitamin D (OSCAL WITH D) 500-200 MG-UNIT per tablet Take 1 tablet by mouth daily., Until Discontinued, Historical Med    diphenhydramine-acetaminophen (TYLENOL PM) 25-500 MG TABS Take 1 tablet by mouth at bedtime as needed., Until Discontinued, Historical Med    dorzolamide-timolol (COSOPT) 22.3-6.8 MG/ML ophthalmic solution Place 1 drop into both eyes 2 (two) times daily., Until Discontinued, Historical  Med    fluocinolone (SYNALAR) 0.01 % external solution Apply topically 2 (two) times daily as needed., Until Discontinued, Historical Med    latanoprost (XALATAN) 0.005 % ophthalmic solution Place 1 drop into both eyes at bedtime., Until Discontinued, Historical Med     vitamin D, CHOLECALCIFEROL, 400 UNITS tablet Take 400 Units by mouth daily., Until Discontinued, Historical Med         DISCHARGE INSTRUCTIONS:   Follow-up your medical doctor one week. Follow-up with cardiology Dr. Clayborn Bigness in 2 weeks.  If you experience worsening of your admission symptoms, develop shortness of breath, life threatening emergency, suicidal or homicidal thoughts you must seek medical attention immediately by calling 911 or calling your MD immediately  if symptoms less severe.  You Must read complete instructions/literature along with all the possible adverse reactions/side effects for all the Medicines you take and that have been prescribed to you. Take any new Medicines after you have completely understood and accept all the possible adverse reactions/side effects.   Please note  You were cared for by a hospitalist during your hospital stay. If you have any questions about your discharge medications or the care you received while you were in the hospital after you are discharged, you can call the unit and asked to speak with the hospitalist on call if the hospitalist that took care of you is not available. Once you are discharged, your primary care physician will handle any further medical issues. Please note that NO REFILLS for any discharge medications will be authorized once you are discharged, as it is imperative that you return to your primary care physician (or establish a relationship with a primary care physician if you do not have one) for your aftercare needs so that they can reassess your need for medications and monitor your lab values.    Today   CHIEF COMPLAINT:   Chief Complaint  Patient presents with  . Tachycardia    HISTORY OF PRESENT ILLNESS:  Bridget Briggs  is a 82 y.o. female sent in with fast heart rate and was found to be in atrial fibrillation   VITAL SIGNS:  Blood pressure 107/52, pulse 75, temperature 99.7 F (37.6 C), temperature  source Oral, resp. rate 20, height 5\' 4"  (1.626 m), weight 82.283 kg (181 lb 6.4 oz), SpO2 97 %.   PHYSICAL EXAMINATION:  GENERAL:  82 y.o.-year-old patient lying in the bed with no acute distress.  EYES: Pupils equal, round, reactive to light and accommodation. No scleral icterus. Extraocular muscles intact.  HEENT: Head atraumatic, normocephalic. Oropharynx and nasopharynx clear.  NECK:  Supple, no jugular venous distention. No thyroid enlargement, no tenderness.  LUNGS: Normal breath sounds bilaterally, no wheezing, rales,rhonchi or crepitation. No use of accessory muscles of respiration.  CARDIOVASCULAR: S1, S2 normal. No murmurs, rubs, or gallops.  ABDOMEN: Soft, non-tender, non-distended. Bowel sounds present. No organomegaly or mass.  EXTREMITIES: No pedal edema, cyanosis, or clubbing.  NEUROLOGIC: Cranial nerves II through XII are intact. Muscle strength 5/5 in all extremities. Sensation intact. Gait not checked.  PSYCHIATRIC: The patient is alert and oriented x 3.  SKIN: No obvious rash, lesion, or ulcer.   DATA REVIEW:   CBC  Recent Labs Lab 06/10/15 0424  WBC 5.4  HGB 11.9*  HCT 35.7  PLT 168    Chemistries   Recent Labs Lab 06/08/15 1526 06/09/15 0419 06/10/15 0424  NA 132* 128* 137  K 3.2* 3.3* 3.6  CL 100* 99* 109  CO2 21* 23 22  GLUCOSE 129* 105* 99  BUN 15 11 8   CREATININE 0.95 0.98 0.71  CALCIUM 9.3 8.1* 7.9*  MG 2.0 1.9  --   AST 26  --   --   ALT 22  --   --   ALKPHOS 55  --   --   BILITOT 0.8  --   --     Cardiac Enzymes  Recent Labs Lab 06/09/15 0419  TROPONINI <0.03    Microbiology Results  Results for orders placed or performed during the hospital encounter of 06/08/15  C difficile quick scan w PCR reflex     Status: Abnormal   Collection Time: 06/09/15  3:29 PM  Result Value Ref Range Status   C Diff antigen POSITIVE (A) NEGATIVE Final   C Diff toxin NEGATIVE NEGATIVE Final   C Diff interpretation   Final    Positive for  toxigenic C. difficile, active toxin production not detected. Patient has toxigenic C. difficile organisms present in the bowel, but toxin was not detected. The patient may be a carrier or the level of toxin in the sample was below the limit  of detection. This information should be used in conjunction with the patient's clinical history when deciding on possible therapy.     Comment: CRITICAL RESULT CALLED TO, READ BACK BY AND VERIFIED WITH: DOLL FERGUSON,RN 06/09/2015 1823 BY JRS   Clostridium Difficile by PCR     Status: Abnormal   Collection Time: 06/09/15  3:29 PM  Result Value Ref Range Status   Toxigenic C Difficile by pcr POSITIVE (A) NEGATIVE Final    Comment: CRITICAL RESULT CALLED TO, READ BACK BY AND VERIFIED WITH: DOLL FERGUSON,RN 06/09/2015 1823 BY JRS.     Management plans discussed with the patient, and she is in agreement.  CODE STATUS:  Code Status History    Date Active Date Inactive Code Status Order ID Comments User Context   06/08/2015  5:32 PM 06/10/2015  1:48 PM Full Code [id number]  Hillary Bow, MD ED    Advance Directive Documentation        Most Recent Value   Type of Advance Directive  Living will   Pre-existing out of facility DNR order (yellow form or pink MOST form)     "MOST" Form in Place?        TOTAL TIME TAKING CARE OF THIS PATIENT: 38 minutes.    Loletha Grayer M.D on 06/10/2015 at 4:18 PM  Between 7am to 6pm - Pager - [phone]  After 6pm go to [email or web address] - password [email or web address] [facility]  Office  [phone]  CC: Primary care physician; Einar Pheasant, MD

## 2015-06-10 NOTE — Progress Notes (Signed)
Room air. NSR. Positive for c diff. A & O. Takes meds ok. IV and tele removed. Discharge instructions given to pt. Prescriptions given to pt. Eliquis card was given to pt. Pt has no further concerns at this time.

## 2015-06-10 NOTE — Care Management Note (Signed)
Case Management Note  Patient Details  Name: Bridget Briggs MRN: [id number] Date of Birth: 05-30-1930  Subjective/Objective:        Has Eliquis coupon to use with her insurance at her local pharmacy.            Action/Plan:   Expected Discharge Date:                  Expected Discharge Plan:  Home/Self Care  In-House Referral:     Discharge planning Services  Homebound not met per provider, CM Consult  Post Acute Care Choice:    Choice offered to:     DME Arranged:    DME Agency:     HH Arranged:    HH Agency:     Status of Service:  In process, will continue to follow  Medicare Important Message Given:  Yes Date Medicare IM Given:    Medicare IM give by:    Date Additional Medicare IM Given:    Additional Medicare Important Message give by:     If discussed at [place] of Stay Meetings, dates discussed:    Additional Comments:  Bridget Spaziani A, RN 06/10/2015, 10:21 AM

## 2015-06-10 NOTE — Discharge Instructions (Signed)

## 2015-06-12 ENCOUNTER — Telehealth: Payer: Self-pay

## 2015-06-12 ENCOUNTER — Telehealth: Payer: Self-pay | Admitting: *Deleted

## 2015-06-12 NOTE — Telephone Encounter (Signed)
Patient was discharged from the hospital 06/10/15 [facility]. She will need an appt on DR. Scott Schedule with in a week for a follow up. Please advise  Contact [phone]

## 2015-06-12 NOTE — Telephone Encounter (Signed)
Will follow up 

## 2015-06-12 NOTE — Telephone Encounter (Signed)
Transition Care Management Follow-up Telephone Call   Date discharged? 06/10/15  How have you been since you were released from the hospital? Doing better.  Still some diarrhea, but it is slacking up.  My last BM was 4 hours ago.    Eating and drinking fluids with no problem. No nausea.  No vomiting.   Do you understand why you were in the hospital? Yes, my heart rate was extremely rapid.   Do you understand the discharge instructions? Yes, and I am not having any problems with anything.   Where were you discharged to? Home   Items Reviewed:  Medications reviewed: Yes, taking all scheduled medications as appropriate.  Allergies reviewed: Yes, no changes.  Dietary changes reviewed: Yes, regular diet, no problems.  Referrals reviewed: Yes, cardiology appointment scheduled.   Functional Questionnaire:   Activities of Daily Living (ADLs):   She states they are independent in the following: Independent in all ADLs States they require assistance with the following: No assistance required at this time.   Any transportation issues/concerns?: No   Any patient concerns? No, not at this time.   Confirmed importance and date/time of follow-up visits scheduled Yes appointment scheduled 06/16/15 at 12:00.  Provider Appointment booked with Dr. Nicki Reaper (PCP).  Confirmed with patient if condition begins to worsen call PCP or go to the ER.  Patient was given the office number and encouraged to call back with question or concerns.  : Yes, patient verbalized understanding.

## 2015-06-12 NOTE — Telephone Encounter (Signed)
Pt coming in tommorw at 12:00pm for hospital f/u

## 2015-06-13 ENCOUNTER — Ambulatory Visit: Payer: Medicare Other | Admitting: Physical Therapy

## 2015-06-13 VITALS — BP 142/64 | HR 64

## 2015-06-13 DIAGNOSIS — R29898 Other symptoms and signs involving the musculoskeletal system: Secondary | ICD-10-CM

## 2015-06-13 DIAGNOSIS — M6289 Other specified disorders of muscle: Secondary | ICD-10-CM | POA: Diagnosis present

## 2015-06-13 DIAGNOSIS — M25552 Pain in left hip: Secondary | ICD-10-CM

## 2015-06-13 NOTE — Telephone Encounter (Signed)
Followed up with transitional care management and patient confirmed appointment scheduled with PCP for hospital follow up on 06/16/15 at noon.

## 2015-06-13 NOTE — Therapy (Signed)
[facility] [address], [place], [address] Phone: [phone]   Fax:  [phone]  Physical Therapy Treatment/Discharge  Patient Details  Name: Bridget Briggs MRN: [id number] Date of Birth: [birth year] Referring Provider: Nicki Reaper  Encounter Date: 06/13/2015      PT End of Session - 06/13/15 1240    Visit Number 11   Number of Visits 9   Date for PT Re-Evaluation 06/15/15   PT Start Time 1030   PT Stop Time 1115   PT Time Calculation (min) 45 min   Activity Tolerance Patient tolerated treatment well;No increased pain   Behavior During Therapy [facility] for tasks assessed/performed      Past Medical History  Diagnosis Date  . GERD (gastroesophageal reflux disease)   . Arthritis   . Depression   . History of chicken pox   . Glaucoma   . Hx: UTI (urinary tract infection)   . History of colon polyps     Past Surgical History  Procedure Laterality Date  . Cholecystectomy  1996  . Appendectomy  1974  . Tonsillectomy and adenoidectomy  1940  . Breast surgery Right 1988    biopsy  . Abdominal hysterectomy  1974    left ovary removed  . Ankle surgery Left 1998  . Nissen fundoplication  99991111  . Eye surgery Bilateral 2005/2006    cataract  . Laparoscopic gastric banding with hiatal hernia repair  2000    Filed Vitals:   06/13/15 1236  BP: 142/64  Pulse: 64  SpO2: 98%    Visit Diagnosis:  Hip joint pain, left  Weakness of both hips      Subjective Assessment - 06/13/15 1237    Subjective Pt reports she spent three days in the hospital since our last visit and this was the reason she missed the last appointment. Initially reported "not sure what I was in the hospital visit for." She understands the hospital visit was for heart related concerns and a new Dx of atrial fibrillation. Reports her hip has been feeling a little bit better since last visit.    Currently in Pain? Yes   Pain Score 2    Pain Location Hip         Objective:   Low level session performed as pt has been hospitalized since previous session and has yet to be seen by her cardiologist. Performed extensive STM/ischemic compression proximal to L greater trochanter up to posterior iliac crest. Pt reports continued pain/tenderness post STM. Instructed in sitting hip isometric IR by pressing lateral edge of foot against wall. 5X10 sec holds. At this point pt seemed to become disinterested in therapy, tolerated exercises but required cueing regularly.  Progressed to SLS L LE for isometric glute med contraction 3X10 sec holds. CGA required.  Pt became dizzy upon the final set. Pt was sat down, BP: 144/66 immediately after sitting, 140/62 5 min after sitting. Reported she was feeling better. Pt was walked out to her car without concern.                         PT Education - 06/13/15 1239    Education provided Yes   Education Details performance of hip IR isometric exercise in sitting    Person(s) Educated Patient   Methods Explanation;Demonstration   Comprehension Verbalized understanding;Returned demonstration             PT Long Term Goals -  05/18/15 1128    PT LONG TERM GOAL #1   Title Patient will report decreased pain when lying on left side to < 2/10   Baseline 4/10   Time 4   Period Weeks   Status On-going   PT LONG TERM GOAL #2   Title Patient will demonstrate improved functional tolerance and activity with improved LEFS of 72/80   Baseline 69/80, at re-eval 63/80   Time 4   Period Weeks   Status On-going               Plan - 06/13/15 1242    Clinical Impression Statement Pt has shown little improvement between sessions. At this time pt is appropriate for discharge due to inconsistent ability to attend therapy and medical concerns. Pt instructed to return to therapy when able to regularly attend therapy sessions.   Pt will benefit from skilled therapeutic  intervention in order to improve on the following deficits Pain;Decreased strength   Rehab Potential Fair   PT Frequency 1x / week   PT Duration 4 weeks   PT Treatment/Interventions Therapeutic exercise;Manual techniques;Moist Heat;Ultrasound   PT Next Visit Plan reassess ability to perform self STM and ability to attain position of piriformis stretching. progress to global hip stretching and strengthing   PT Home Exercise Plan continue with exercises at the [facility] as usual and walking.   Consulted and Agree with Plan of Care Patient        Problem List Patient Active Problem List   Diagnosis Date Noted  . Diarrhea 06/08/2015  . New onset a-fib ([place]) 06/08/2015  . A-fib ([place]) 06/08/2015  . Left hip pain 04/05/2015  . Chest pressure 02/15/2015  . Pain of left thumb 08/06/2014  . Health care maintenance 08/06/2014  . Osteoporosis 07/28/2014  . Bronchiectasis without acute exacerbation ([place]) 01/20/2014  . Multiple pulmonary nodules 01/20/2014  . Abnormal CXR 11/02/2013  . Chest pain 11/01/2013  . Fatigue 11/01/2013  . Burning sensation in lower extremity 11/01/2013  . Environmental allergies 04/19/2013  . Arthritis 01/18/2013  . Depression 01/18/2013  . GERD (gastroesophageal reflux disease) 01/18/2013  . Glaucoma 01/18/2013  . History of colonic polyps 01/18/2013  . Carotid stenosis 01/18/2013    Vinson Moselle Rij SPT 06/13/2015, 12:54 PM  Mont Dutton PT DPT  [facility] [address], [place], [address] Phone: [phone]   Fax:  [phone]  Name: Bridget Briggs MRN: [id number] Date of Birth: 27-Sep-1930

## 2015-06-14 LAB — GIARDIA, EIA; OVA/PARASITE: GIARDIA AG STL: NEGATIVE

## 2015-06-14 LAB — O&P RESULT

## 2015-06-16 ENCOUNTER — Encounter: Payer: Self-pay | Admitting: Internal Medicine

## 2015-06-16 ENCOUNTER — Ambulatory Visit (INDEPENDENT_AMBULATORY_CARE_PROVIDER_SITE_OTHER): Payer: Medicare Other | Admitting: Internal Medicine

## 2015-06-16 VITALS — BP 120/60 | HR 60 | Temp 98.1°F | Resp 17 | Ht 64.0 in | Wt 179.5 lb

## 2015-06-16 DIAGNOSIS — J479 Bronchiectasis, uncomplicated: Secondary | ICD-10-CM

## 2015-06-16 DIAGNOSIS — I4891 Unspecified atrial fibrillation: Secondary | ICD-10-CM | POA: Diagnosis not present

## 2015-06-16 DIAGNOSIS — K219 Gastro-esophageal reflux disease without esophagitis: Secondary | ICD-10-CM

## 2015-06-16 DIAGNOSIS — R5383 Other fatigue: Secondary | ICD-10-CM

## 2015-06-16 DIAGNOSIS — R197 Diarrhea, unspecified: Secondary | ICD-10-CM

## 2015-06-16 NOTE — Progress Notes (Signed)
Patient ID: Bridget Briggs, female   DOB: [birth year], 82 y.o.   MRN: [id number]   Subjective:    Patient ID: Bridget Briggs, female    DOB: May 11, 1931, 82 y.o.   MRN: [id number]  HPI  Patient here for hospital follow up.   Was admitted 06/08/15 with diarrhea and new onset afib with RVR.  Converted to SR.  Was placed on Eliquis.  On metoprolol.  She is to f/u with Dr Clayborn Bigness.  She was found to be positive for C. Diff.  Treated with flagyl.  Diarrhea is better.  Getting back to her normal bowels.  No abdominal pain or cramping.  Still with decreased appetite, but is better.  Still with some decreased energy, but is improving.  No chest pain or tightness.  No sob.  No cough or congestion.  Has f/u planned 06/25/15 with Dr Clayborn Bigness.     Past Medical History  Diagnosis Date  . GERD (gastroesophageal reflux disease)   . Arthritis   . Depression   . History of chicken pox   . Glaucoma   . Hx: UTI (urinary tract infection)   . History of colon polyps    Past Surgical History  Procedure Laterality Date  . Cholecystectomy  1996  . Appendectomy  1974  . Tonsillectomy and adenoidectomy  1940  . Breast surgery Right 1988    biopsy  . Abdominal hysterectomy  1974    left ovary removed  . Ankle surgery Left 1998  . Nissen fundoplication  99991111  . Eye surgery Bilateral 2005/2006    cataract  . Laparoscopic gastric banding with hiatal hernia repair  2000   Family History  Problem Relation Age of Onset  . Arthritis Mother   . Leukemia Mother   . Lung cancer Father   . Osteoporosis Other    Social History   Social History  . Marital Status: Married    Spouse Name: N/A  . Number of Children: N/A  . Years of Education: N/A   Social History Main Topics  . Smoking status: Former Smoker -- 1.00 packs/day for 30 years    Types: Cigarettes    Quit date: 05/20/1988  . Smokeless tobacco: Never Used  . Alcohol Use: No  . Drug Use: No  . Sexual Activity: No   Other Topics Concern  . None     Social History Narrative    Outpatient Encounter Prescriptions as of 06/16/2015  Medication Sig  . apixaban (ELIQUIS) 5 MG TABS tablet Take 1 tablet (5 mg total) by mouth 2 (two) times daily.  . beta carotene w/minerals (OCUVITE) tablet Take 1 tablet by mouth daily.  . calcium-vitamin D (OSCAL WITH D) 500-200 MG-UNIT per tablet Take 1 tablet by mouth daily.  . diphenhydramine-acetaminophen (TYLENOL PM) 25-500 MG TABS Take 1 tablet by mouth at bedtime as needed.  . dorzolamide-timolol (COSOPT) 22.3-6.8 MG/ML ophthalmic solution Place 1 drop into both eyes 2 (two) times daily.  . fluocinolone (SYNALAR) 0.01 % external solution Apply topically 2 (two) times daily as needed.  . latanoprost (XALATAN) 0.005 % ophthalmic solution Place 1 drop into both eyes at bedtime.  . metoprolol tartrate (LOPRESSOR) 25 MG tablet Take 1 tablet (25 mg total) by mouth 2 (two) times daily.  . metroNIDAZOLE (FLAGYL) 500 MG tablet Take 1 tablet (500 mg total) by mouth every 8 (eight) hours.  . vitamin D, CHOLECALCIFEROL, 400 UNITS tablet Take 400 Units by mouth daily.   No facility-administered encounter medications on file  as of 06/16/2015.    Review of Systems  Constitutional: Positive for appetite change. Negative for unexpected weight change.  HENT: Negative for congestion and sinus pressure.   Respiratory: Negative for cough, chest tightness and shortness of breath.   Cardiovascular: Negative for chest pain, palpitations and leg swelling.  Gastrointestinal: Positive for diarrhea. Negative for nausea, vomiting and abdominal pain.  Genitourinary: Negative for dysuria and difficulty urinating.  Musculoskeletal: Negative for back pain and joint swelling.  Skin: Negative for color change and rash.  Neurological: Negative for dizziness, light-headedness and headaches.  Psychiatric/Behavioral: Negative for dysphoric mood and agitation.       Objective:    Physical Exam  Constitutional: She appears  well-developed and well-nourished. No distress.  HENT:  Nose: Nose normal.  Mouth/Throat: Oropharynx is clear and moist.  Eyes: Conjunctivae are normal. Right eye exhibits no discharge. Left eye exhibits no discharge.  Neck: Neck supple. No thyromegaly present.  Cardiovascular: Normal rate and regular rhythm.   Pulmonary/Chest: Breath sounds normal. No respiratory distress. She has no wheezes.  Abdominal: Soft. Bowel sounds are normal. There is no tenderness.  Musculoskeletal: She exhibits no edema or tenderness.  Lymphadenopathy:    She has no cervical adenopathy.  Skin: No rash noted. No erythema.  Psychiatric: She has a normal mood and affect. Her behavior is normal.    BP 120/60 mmHg  Pulse 60  Temp(Src) 98.1 F (36.7 C) (Oral)  Resp 17  Ht 5\' 4"  (1.626 m)  Wt 179 lb 8 oz (81.421 kg)  BMI 30.80 kg/m2  SpO2 97% Wt Readings from Last 3 Encounters:  06/16/15 179 lb 8 oz (81.421 kg)  06/10/15 181 lb 6.4 oz (82.283 kg)  06/08/15 181 lb 6 oz (82.271 kg)     Lab Results  Component Value Date   WBC 5.4 06/10/2015   HGB 11.9* 06/10/2015   HCT 35.7 06/10/2015   PLT 168 06/10/2015   GLUCOSE 99 06/10/2015   CHOL 185 09/14/2014   TRIG 123.0 09/14/2014   HDL 62.00 09/14/2014   LDLCALC 98 09/14/2014   ALT 22 06/08/2015   AST 26 06/08/2015   NA 137 06/10/2015   K 3.6 06/10/2015   CL 109 06/10/2015   CREATININE 0.71 06/10/2015   BUN 8 06/10/2015   CO2 22 06/10/2015   TSH 3.186 06/08/2015       Assessment & Plan:   Problem List Items Addressed This Visit    Bronchiectasis without acute exacerbation ([place])    Has seen pulmonary.  Breathing stable.  Has seen pulmonary.  Refer to their note for details.        Diarrhea    Improved.  Being treated for C. Diff.  Probiotic.  Follow.        Fatigue    Felt to be multifactorial.  Improving.  Appears to be in SR.  Diarrhea better.  Appetite improving.  On flagyl.  Follow.        GERD (gastroesophageal reflux disease)     No upper symptoms reported.  Follow.        New onset a-fib [facility]) - Primary    Was diagnosed with recent admission.  Presented with Afib with RVR.  Now appears to be in SR.  On metoprolol and eliquis.  Has f/u with cardiology 06/25/15.             Einar Pheasant, MD

## 2015-06-16 NOTE — Progress Notes (Signed)
Pre-visit discussion using our clinic review tool. No additional management support is needed unless otherwise documented below in the visit note.  

## 2015-06-18 ENCOUNTER — Encounter: Payer: Self-pay | Admitting: Internal Medicine

## 2015-06-18 NOTE — Assessment & Plan Note (Signed)
No upper symptoms reported.  Follow.   

## 2015-06-18 NOTE — Assessment & Plan Note (Signed)
Was diagnosed with recent admission.  Presented with Afib with RVR.  Now appears to be in SR.  On metoprolol and eliquis.  Has f/u with cardiology 06/25/15.

## 2015-06-18 NOTE — Assessment & Plan Note (Signed)
Improved.  Being treated for C. Diff.  Probiotic.  Follow.

## 2015-06-18 NOTE — Assessment & Plan Note (Addendum)
Felt to be multifactorial.  Improving.  Appears to be in SR.  Diarrhea better.  Appetite improving.  On flagyl.  Follow.

## 2015-06-18 NOTE — Assessment & Plan Note (Addendum)
Has seen pulmonary.  Breathing stable.  Has seen pulmonary.  Refer to their note for details.

## 2015-06-24 DIAGNOSIS — J069 Acute upper respiratory infection, unspecified: Secondary | ICD-10-CM | POA: Diagnosis not present

## 2015-06-26 DIAGNOSIS — R011 Cardiac murmur, unspecified: Secondary | ICD-10-CM | POA: Diagnosis not present

## 2015-06-26 DIAGNOSIS — M1990 Unspecified osteoarthritis, unspecified site: Secondary | ICD-10-CM | POA: Diagnosis not present

## 2015-06-26 DIAGNOSIS — R938 Abnormal findings on diagnostic imaging of other specified body structures: Secondary | ICD-10-CM | POA: Diagnosis not present

## 2015-06-26 DIAGNOSIS — J40 Bronchitis, not specified as acute or chronic: Secondary | ICD-10-CM | POA: Diagnosis not present

## 2015-06-26 DIAGNOSIS — E669 Obesity, unspecified: Secondary | ICD-10-CM | POA: Diagnosis not present

## 2015-06-26 DIAGNOSIS — I208 Other forms of angina pectoris: Secondary | ICD-10-CM | POA: Diagnosis not present

## 2015-06-26 DIAGNOSIS — R0602 Shortness of breath: Secondary | ICD-10-CM | POA: Diagnosis not present

## 2015-06-27 ENCOUNTER — Telehealth: Payer: Self-pay | Admitting: *Deleted

## 2015-06-27 NOTE — Telephone Encounter (Signed)
Please advise, thanks.

## 2015-06-27 NOTE — Telephone Encounter (Signed)
If the only reason for the question of abx is that she was diagnosed with afib and regurgitation, then no she does not need abx prior to a dental procedure.  Her echo was ok and she only had mild MR.  afib does not require abx prior to procedure.  Need to make sure that there is no other reason they were questioning need for abx (i.e., joint replacement, etc).  Thanks

## 2015-06-27 NOTE — Telephone Encounter (Signed)
Patient's dentist requested to know, if she needed an antibiotic to have dental work. Dental office:Dr Cyndia Diver Office Number [phone] Patient was diagnosed with A17fib with regurgitation by [facility]  Please advise  Pt. Contact [phone]

## 2015-06-28 ENCOUNTER — Ambulatory Visit (INDEPENDENT_AMBULATORY_CARE_PROVIDER_SITE_OTHER): Payer: Medicare Other | Admitting: Family Medicine

## 2015-06-28 ENCOUNTER — Encounter: Payer: Self-pay | Admitting: Family Medicine

## 2015-06-28 VITALS — BP 126/62 | HR 73 | Temp 97.9°F | Ht 64.0 in | Wt 176.2 lb

## 2015-06-28 DIAGNOSIS — R059 Cough, unspecified: Secondary | ICD-10-CM

## 2015-06-28 DIAGNOSIS — R5383 Other fatigue: Secondary | ICD-10-CM | POA: Diagnosis not present

## 2015-06-28 DIAGNOSIS — R05 Cough: Secondary | ICD-10-CM | POA: Diagnosis not present

## 2015-06-28 MED ORDER — HYDROCOD POLST-CPM POLST ER 10-8 MG/5ML PO SUER: 5.0000 mL | Freq: Two times a day (BID) | ORAL | Status: DC | PRN

## 2015-06-28 NOTE — Assessment & Plan Note (Signed)
New problem.  Exam unremarkable. Likely viral in origin. Treating with Tussionex

## 2015-06-28 NOTE — Patient Instructions (Signed)
Your fatigue is multifactorial.  Give it a little more time.  Follow up closely with Dr. Nicki Reaper.  You may benefit from a switch off the metoprolol.   Take care  Dr. Lacinda Axon

## 2015-06-28 NOTE — Telephone Encounter (Signed)
Spoke with the patient and verbalized understanding of no need for antibiotics.  Patient requested that I call the dentist office, spoke with the office and they have asked for the information in writing.  I have sent a letter with this included to their office at [phone]

## 2015-06-28 NOTE — Assessment & Plan Note (Signed)
Established problem, worsening. Likely multifactorial - recent diagnosis of A. fib, hospital admission, C. difficile colitis, viral infection (cough see separate problem), Beta blocker use.  Patient in sinus rhythm today. Advised continued watchful waiting. I anticipate that this will slowly improve. Could be related to beta blocker use. Would consider changing to diltiazem in the future if persist.

## 2015-06-28 NOTE — Progress Notes (Signed)
Pre visit review using our clinic review tool, if applicable. No additional management support is needed unless otherwise documented below in the visit note. 

## 2015-06-28 NOTE — Progress Notes (Signed)
Subjective:  Patient ID: Bridget Briggs, female    DOB: Oct 26, 1930  Age: 82 y.o. MRN: [id number]  CC: Fatigue, cough  HPI:  82 year old female with recent admission in January for new onset A. fib and C. difficile colitis presents with the above complaints.  Fatigue  This is been an ongoing issue since she was discharged from the hospital.  She continues to report significant fatigue.  She states that she feels like she has no energy and that she is weak.  She states that she is getting adequate sleep.  She has not yet completed her course of Flagyl for C. difficile colitis but states that her diarrhea has resolved.  She endorses compliance with her metoprolol and states that he was recently decreased.  She denies any chest pain or shortness of breath. She does report that she just feels generally weak and fatigued.  Cough  Patient has had a nonproductive cough for the past week.  She was recently seen at urgent care was given Tessalon.  No associated fevers or chills. No reports of shortness of breath.  She's had little improvement with the Tessalon.  No known exacerbating factors.  Social Hx   Social History   Social History  . Marital Status: Married    Spouse Name: N/A  . Number of Children: N/A  . Years of Education: N/A   Social History Main Topics  . Smoking status: Former Smoker -- 1.00 packs/day for 30 years    Types: Cigarettes    Quit date: 05/20/1988  . Smokeless tobacco: Never Used  . Alcohol Use: No  . Drug Use: No  . Sexual Activity: No   Other Topics Concern  . None   Social History Narrative   Review of Systems  Constitutional: Positive for fatigue. Negative for fever.  Respiratory: Positive for cough.    Objective:  BP 126/62 mmHg  Pulse 73  Temp(Src) 97.9 F (36.6 C) (Oral)  Ht 5\' 4"  (1.626 m)  Wt 176 lb 4 oz (79.946 kg)  BMI 30.24 kg/m2  SpO2 98%  BP/Weight 06/28/2015 06/16/2015 [id number]  Systolic BP 123XX123 [id number]    Diastolic BP 62 60 64  Wt. (Lbs) 176.25 179.5 -  BMI 30.24 30.8 -   Physical Exam  Constitutional: She is oriented to person, place, and time. She appears well-developed. No distress.  HENT:  Head: Normocephalic and atraumatic.  Eyes: Conjunctivae are normal. No scleral icterus.  Cardiovascular: Normal rate and regular rhythm.   Pulmonary/Chest: Effort normal and breath sounds normal.  Neurological: She is alert and oriented to person, place, and time.  Psychiatric:  Flat affect.   Vitals reviewed.  Lab Results  Component Value Date   WBC 5.4 06/10/2015   HGB 11.9* 06/10/2015   HCT 35.7 06/10/2015   PLT 168 06/10/2015   GLUCOSE 99 06/10/2015   CHOL 185 09/14/2014   TRIG 123.0 09/14/2014   HDL 62.00 09/14/2014   LDLCALC 98 09/14/2014   ALT 22 06/08/2015   AST 26 06/08/2015   NA 137 06/10/2015   K 3.6 06/10/2015   CL 109 06/10/2015   CREATININE 0.71 06/10/2015   BUN 8 06/10/2015   CO2 22 06/10/2015   TSH 3.186 06/08/2015   Assessment & Plan:   Problem List Items Addressed This Visit    Fatigue    Established problem, worsening. Likely multifactorial - recent diagnosis of A. fib, hospital admission, C. difficile colitis, viral infection (cough see separate problem), Beta blocker use.  Patient  in sinus rhythm today. Advised continued watchful waiting. I anticipate that this will slowly improve. Could be related to beta blocker use. Would consider changing to diltiazem in the future if persist.      Cough - Primary    New problem.  Exam unremarkable. Likely viral in origin. Treating with Tussionex         Meds ordered this encounter  Medications  . chlorpheniramine-HYDROcodone (TUSSIONEX PENNKINETIC ER) 10-8 MG/5ML SUER    Sig: Take 5 mLs by mouth every 12 (twelve) hours as needed.    Dispense:  115 mL    Refill:  0   Follow-up: PRN  [place]

## 2015-06-28 NOTE — Telephone Encounter (Signed)
Spoke with patient and she has only been diagnosed with Afib and regurgitation. Patient stated that she has not had any joint replacement, etc. Would you like me to call the dentist office.

## 2015-06-28 NOTE — Telephone Encounter (Signed)
Please call patient to explain the note from Dr. Nicki Reaper  Thanks  Pt contact [phone]

## 2015-07-12 ENCOUNTER — Encounter: Payer: Self-pay | Admitting: Internal Medicine

## 2015-07-12 ENCOUNTER — Ambulatory Visit (INDEPENDENT_AMBULATORY_CARE_PROVIDER_SITE_OTHER): Payer: Medicare Other | Admitting: Internal Medicine

## 2015-07-12 ENCOUNTER — Telehealth: Payer: Self-pay | Admitting: *Deleted

## 2015-07-12 VITALS — BP 120/60 | HR 60 | Temp 98.4°F | Resp 18 | Ht 63.0 in | Wt 174.5 lb

## 2015-07-12 DIAGNOSIS — I480 Paroxysmal atrial fibrillation: Secondary | ICD-10-CM | POA: Diagnosis not present

## 2015-07-12 DIAGNOSIS — D649 Anemia, unspecified: Secondary | ICD-10-CM

## 2015-07-12 DIAGNOSIS — F32A Depression, unspecified: Secondary | ICD-10-CM

## 2015-07-12 DIAGNOSIS — R5383 Other fatigue: Secondary | ICD-10-CM

## 2015-07-12 DIAGNOSIS — F329 Major depressive disorder, single episode, unspecified: Secondary | ICD-10-CM

## 2015-07-12 DIAGNOSIS — Z1239 Encounter for other screening for malignant neoplasm of breast: Secondary | ICD-10-CM

## 2015-07-12 MED ORDER — SERTRALINE HCL 50 MG PO TABS: 50.0000 mg | ORAL_TABLET | Freq: Every day | ORAL | Status: DC

## 2015-07-12 NOTE — Progress Notes (Signed)
Pre-visit discussion using our clinic review tool. No additional management support is needed unless otherwise documented below in the visit note.  

## 2015-07-12 NOTE — Telephone Encounter (Signed)
Please advise a place on Dr. Bary Leriche schedule in six weeks to place patient-45min follow up

## 2015-07-12 NOTE — Progress Notes (Signed)
Patient ID: IDESSA NIRO, female   DOB: 02/13/1931, 82 y.o.   MRN: [id number]   Subjective:    Patient ID: GRAELYN WARBURTON, female    DOB: 1931/04/28, 82 y.o.   MRN: [id number]  HPI  Patient with past history of depression, GERD and afib.  She comes in today to follow up on these issues as well as for a complete physical exam.  Admitted 06/08/15 with diarrhea and new onset afib.  Converted to SR.  Was on metoprolol.  Saw Dr Clayborn Bigness recently.  See his note for details.  He had discussed with her regarding decreasing metoprolol.  She saw Dr Lacinda Axon recently for fatigue.  See his note for details.  She has stopped the metoprolol.  States was told to stop because of her fatigue.  No documentation of this.  She still reports some fatigue.  Reports does not have desire to go out.  No chest pain.  No increased heart rate or palpitations.  No sleeping well.  Does report feeling depressed.  No suicidal ideations.  Bowels better.  Eating.  No vomiting.      Past Medical History  Diagnosis Date  . GERD (gastroesophageal reflux disease)   . Arthritis   . Depression   . History of chicken pox   . Glaucoma   . Hx: UTI (urinary tract infection)   . History of colon polyps    Past Surgical History  Procedure Laterality Date  . Cholecystectomy  1996  . Appendectomy  1974  . Tonsillectomy and adenoidectomy  1940  . Breast surgery Right 1988    biopsy  . Abdominal hysterectomy  1974    left ovary removed  . Ankle surgery Left 1998  . Nissen fundoplication  99991111  . Eye surgery Bilateral 2005/2006    cataract  . Laparoscopic gastric banding with hiatal hernia repair  2000   Family History  Problem Relation Age of Onset  . Arthritis Mother   . Leukemia Mother   . Lung cancer Father   . Osteoporosis Other    Social History   Social History  . Marital Status: Married    Spouse Name: N/A  . Number of Children: N/A  . Years of Education: N/A   Social History Main Topics  . Smoking status:  Former Smoker -- 1.00 packs/day for 30 years    Types: Cigarettes    Quit date: 05/20/1988  . Smokeless tobacco: Never Used  . Alcohol Use: No  . Drug Use: No  . Sexual Activity: No   Other Topics Concern  . None   Social History Narrative    Outpatient Encounter Prescriptions as of 07/12/2015  Medication Sig  . apixaban (ELIQUIS) 5 MG TABS tablet Take 1 tablet (5 mg total) by mouth 2 (two) times daily.  . beta carotene w/minerals (OCUVITE) tablet Take 1 tablet by mouth daily.  . calcium-vitamin D (OSCAL WITH D) 500-200 MG-UNIT per tablet Take 1 tablet by mouth daily.  . diphenhydramine-acetaminophen (TYLENOL PM) 25-500 MG TABS Take 1 tablet by mouth at bedtime as needed.  . dorzolamide-timolol (COSOPT) 22.3-6.8 MG/ML ophthalmic solution Place 1 drop into both eyes 2 (two) times daily.  . fluocinolone (SYNALAR) 0.01 % external solution Apply topically 2 (two) times daily as needed.  . latanoprost (XALATAN) 0.005 % ophthalmic solution Place 1 drop into both eyes at bedtime.  . metoprolol tartrate (LOPRESSOR) 25 MG tablet Take 1 tablet (25 mg total) by mouth 2 (two) times daily.  . metroNIDAZOLE (  FLAGYL) 500 MG tablet Take 1 tablet (500 mg total) by mouth every 8 (eight) hours.  . vitamin D, CHOLECALCIFEROL, 400 UNITS tablet Take 400 Units by mouth daily.  . [DISCONTINUED] chlorpheniramine-HYDROcodone (TUSSIONEX PENNKINETIC ER) 10-8 MG/5ML SUER Take 5 mLs by mouth every 12 (twelve) hours as needed.  . sertraline (ZOLOFT) 50 MG tablet Take 1 tablet (50 mg total) by mouth daily.   No facility-administered encounter medications on file as of 07/12/2015.    Review of Systems  Constitutional: Positive for fatigue. Negative for fever.  HENT: Negative for congestion and sinus pressure.   Respiratory: Negative for cough, chest tightness and shortness of breath.   Cardiovascular: Negative for chest pain, palpitations and leg swelling.  Gastrointestinal: Negative for nausea, vomiting and  abdominal pain.  Genitourinary: Negative for dysuria and difficulty urinating.  Musculoskeletal: Negative for back pain and joint swelling.  Skin: Negative for color change and rash.  Neurological: Negative for dizziness, light-headedness and headaches.  Psychiatric/Behavioral: Positive for sleep disturbance. Negative for suicidal ideas and agitation.       Does report feeling depressed.         Objective:    Physical Exam  Constitutional: She appears well-developed and well-nourished. No distress.  HENT:  Nose: Nose normal.  Mouth/Throat: Oropharynx is clear and moist.  Eyes: Conjunctivae are normal. Right eye exhibits no discharge. Left eye exhibits no discharge.  Neck: Neck supple. No thyromegaly present.  Cardiovascular: Normal rate and regular rhythm.   Pulmonary/Chest: Breath sounds normal. No respiratory distress. She has no wheezes.  Abdominal: Soft. Bowel sounds are normal. There is no tenderness.  Musculoskeletal: She exhibits no edema or tenderness.  Lymphadenopathy:    She has no cervical adenopathy.  Skin: No rash noted. No erythema.  Psychiatric: She has a normal mood and affect. Her behavior is normal.    BP 120/60 mmHg  Pulse 60  Temp(Src) 98.4 F (36.9 C) (Oral)  Resp 18  Ht 5\' 3"  (1.6 m)  Wt 174 lb 8 oz (79.153 kg)  BMI 30.92 kg/m2  SpO2 97% Wt Readings from Last 3 Encounters:  07/12/15 174 lb 8 oz (79.153 kg)  06/28/15 176 lb 4 oz (79.946 kg)  06/16/15 179 lb 8 oz (81.421 kg)     Lab Results  Component Value Date   WBC 5.4 06/10/2015   HGB 11.9* 06/10/2015   HCT 35.7 06/10/2015   PLT 168 06/10/2015   GLUCOSE 99 06/10/2015   CHOL 185 09/14/2014   TRIG 123.0 09/14/2014   HDL 62.00 09/14/2014   LDLCALC 98 09/14/2014   ALT 22 06/08/2015   AST 26 06/08/2015   NA 137 06/10/2015   K 3.6 06/10/2015   CL 109 06/10/2015   CREATININE 0.71 06/10/2015   BUN 8 06/10/2015   CO2 22 06/10/2015   TSH 3.186 06/08/2015       Assessment & Plan:    Problem List Items Addressed This Visit    A-fib ([place])    Appears to be in SR now.  On eliquis.  Continue.  Notified Dr Etta Quill office of need for refill.  Off metoprolol.  Heart rate and blood pressure doing well.  Follow.        Depression    Reports feeling depressed.  Not sleeping well.  No suicidal ideations.  Discussed with her today.  Start zoloft as directed.  Follow.  Get her back in soon to reassess.        Relevant Medications   sertraline (ZOLOFT) 50  MG tablet   Fatigue    Persistent fatigue.  Feels depressed.  Treat with zoloft as directed.  Appears to be in SR.  Off metoprolol.  Diarrhea better.  Recent hgb decreased.  Check cbc, iron stores, B12 and thyroid test.        Relevant Orders   CBC with Differential/Platelet   TSH   Hepatic function panel   Basic metabolic panel    Other Visit Diagnoses    Screening breast examination    -  Primary    Relevant Orders    MM DIGITAL SCREENING BILATERAL    Anemia, unspecified anemia type        Relevant Orders    IBC panel    Ferritin    Vitamin B12        Einar Pheasant, MD

## 2015-07-12 NOTE — Telephone Encounter (Signed)
I can see her 08/23/15 at 12:00.

## 2015-07-12 NOTE — Patient Instructions (Signed)
Start zoloft (sertraline) 1/2 tablet per day for one week and then increase to one tablet per day.

## 2015-07-13 ENCOUNTER — Encounter: Payer: Self-pay | Admitting: Internal Medicine

## 2015-07-13 NOTE — Assessment & Plan Note (Signed)
Reports feeling depressed.  Not sleeping well.  No suicidal ideations.  Discussed with her today.  Start zoloft as directed.  Follow.  Get her back in soon to reassess.

## 2015-07-13 NOTE — Assessment & Plan Note (Signed)
Persistent fatigue.  Feels depressed.  Treat with zoloft as directed.  Appears to be in SR.  Off metoprolol.  Diarrhea better.  Recent hgb decreased.  Check cbc, iron stores, B12 and thyroid test.

## 2015-07-13 NOTE — Assessment & Plan Note (Signed)
Appears to be in SR now.  On eliquis.  Continue.  Notified Dr Etta Quill office of need for refill.  Off metoprolol.  Heart rate and blood pressure doing well.  Follow.

## 2015-07-26 ENCOUNTER — Ambulatory Visit
Admission: RE | Admit: 2015-07-26 | Discharge: 2015-07-26 | Disposition: A | Discharge status: Home / Self Care | Payer: Medicare Other | Source: Ambulatory Visit | Attending: Internal Medicine | Admitting: Internal Medicine

## 2015-07-26 DIAGNOSIS — Z1231 Encounter for screening mammogram for malignant neoplasm of breast: Secondary | ICD-10-CM | POA: Insufficient documentation

## 2015-07-26 DIAGNOSIS — S0500XA Injury of conjunctiva and corneal abrasion without foreign body, unspecified eye, initial encounter: Secondary | ICD-10-CM | POA: Diagnosis not present

## 2015-07-26 DIAGNOSIS — Z1239 Encounter for other screening for malignant neoplasm of breast: Secondary | ICD-10-CM

## 2015-07-27 DIAGNOSIS — S0500XA Injury of conjunctiva and corneal abrasion without foreign body, unspecified eye, initial encounter: Secondary | ICD-10-CM | POA: Diagnosis not present

## 2015-08-08 DIAGNOSIS — S0500XA Injury of conjunctiva and corneal abrasion without foreign body, unspecified eye, initial encounter: Secondary | ICD-10-CM | POA: Diagnosis not present

## 2015-08-22 DIAGNOSIS — H401132 Primary open-angle glaucoma, bilateral, moderate stage: Secondary | ICD-10-CM | POA: Diagnosis not present

## 2015-08-23 ENCOUNTER — Ambulatory Visit: Payer: Medicare Other | Admitting: Internal Medicine

## 2015-08-28 ENCOUNTER — Ambulatory Visit (INDEPENDENT_AMBULATORY_CARE_PROVIDER_SITE_OTHER): Payer: Medicare Other | Admitting: Internal Medicine

## 2015-08-28 ENCOUNTER — Encounter: Payer: Self-pay | Admitting: Internal Medicine

## 2015-08-28 VITALS — BP 138/70 | HR 67 | Temp 98.1°F | Resp 18 | Ht 63.0 in | Wt 173.0 lb

## 2015-08-28 DIAGNOSIS — R0789 Other chest pain: Secondary | ICD-10-CM

## 2015-08-28 DIAGNOSIS — I480 Paroxysmal atrial fibrillation: Secondary | ICD-10-CM | POA: Diagnosis not present

## 2015-08-28 DIAGNOSIS — K219 Gastro-esophageal reflux disease without esophagitis: Secondary | ICD-10-CM

## 2015-08-28 DIAGNOSIS — F329 Major depressive disorder, single episode, unspecified: Secondary | ICD-10-CM

## 2015-08-28 DIAGNOSIS — F32A Depression, unspecified: Secondary | ICD-10-CM

## 2015-08-28 DIAGNOSIS — R079 Chest pain, unspecified: Secondary | ICD-10-CM | POA: Diagnosis not present

## 2015-08-28 DIAGNOSIS — D649 Anemia, unspecified: Secondary | ICD-10-CM | POA: Diagnosis not present

## 2015-08-28 DIAGNOSIS — R208 Other disturbances of skin sensation: Secondary | ICD-10-CM

## 2015-08-28 DIAGNOSIS — R918 Other nonspecific abnormal finding of lung field: Secondary | ICD-10-CM

## 2015-08-28 NOTE — Progress Notes (Signed)
Patient ID: Bridget Briggs, female   DOB: [birth year], 82 y.o.   MRN: [id number]   Subjective:    Patient ID: Bridget Briggs, female    DOB: 1931-04-27, 82 y.o.   MRN: [id number]  HPI  Patient here for a scheduled follow up.  Last visit, I started her on zoloft.  See last note.  Was having some issues with fatigue and depression.  She reports she feels better.  Does not feel as depressed.  Feels has more energy.  Is eating.  No nausea or vomiting.  Bowels stable.  She does report some persistent chest heaviness.  States this is constant.  Has been present for a while (first said one month and then stated that this is the same sensation she has had throughout the previous w/up).  She has seen Dr Clayborn Bigness.  Had noninvasive cardiac evaluation including echocardiogram and myoview.  Per Dr Clayborn Bigness, both studies were "reasonably unremarkable".   She is scheduled to see pulmonary on 08/30/15.  States nothing seems to make the pain better or worse.  No cough or congestion.  No acid reflux.  She does report some increased gas.  Probiotic has helped some.     Past Medical History  Diagnosis Date  . GERD (gastroesophageal reflux disease)   . Arthritis   . Depression   . History of chicken pox   . Glaucoma   . Hx: UTI (urinary tract infection)   . History of colon polyps    Past Surgical History  Procedure Laterality Date  . Cholecystectomy  1996  . Appendectomy  1974  . Tonsillectomy and adenoidectomy  1940  . Breast surgery Right 1988    biopsy  . Abdominal hysterectomy  1974    left ovary removed  . Ankle surgery Left 1998  . Nissen fundoplication  99991111  . Eye surgery Bilateral 2005/2006    cataract  . Laparoscopic gastric banding with hiatal hernia repair  2000  . Breast biopsy Right 1988    EXCISIONAL - NEG   Family History  Problem Relation Age of Onset  . Arthritis Mother   . Leukemia Mother   . Lung cancer Father   . Osteoporosis Other   . Breast cancer Neg Hx    Social  History   Social History  . Marital Status: Married    Spouse Name: N/A  . Number of Children: N/A  . Years of Education: N/A   Social History Main Topics  . Smoking status: Former Smoker -- 1.00 packs/day for 30 years    Types: Cigarettes    Quit date: 05/20/1988  . Smokeless tobacco: Never Used  . Alcohol Use: No  . Drug Use: No  . Sexual Activity: No   Other Topics Concern  . None   Social History Narrative    Outpatient Encounter Prescriptions as of 08/28/2015  Medication Sig  . apixaban (ELIQUIS) 5 MG TABS tablet Take 1 tablet (5 mg total) by mouth 2 (two) times daily.  . beta carotene w/minerals (OCUVITE) tablet Take 1 tablet by mouth daily.  . calcium-vitamin D (OSCAL WITH D) 500-200 MG-UNIT per tablet Take 1 tablet by mouth daily.  . diphenhydramine-acetaminophen (TYLENOL PM) 25-500 MG TABS Take 1 tablet by mouth at bedtime as needed.  . dorzolamide-timolol (COSOPT) 22.3-6.8 MG/ML ophthalmic solution Place 1 drop into both eyes 2 (two) times daily.  . fluocinolone (SYNALAR) 0.01 % external solution Apply topically 2 (two) times daily as needed.  . latanoprost (XALATAN) 0.005 %  ophthalmic solution Place 1 drop into both eyes at bedtime.  . sertraline (ZOLOFT) 50 MG tablet Take 1 tablet (50 mg total) by mouth daily.  . vitamin D, CHOLECALCIFEROL, 400 UNITS tablet Take 400 Units by mouth daily.  . [DISCONTINUED] metoprolol tartrate (LOPRESSOR) 25 MG tablet Take 1 tablet (25 mg total) by mouth 2 (two) times daily.  . [DISCONTINUED] metroNIDAZOLE (FLAGYL) 500 MG tablet Take 1 tablet (500 mg total) by mouth every 8 (eight) hours.   No facility-administered encounter medications on file as of 08/28/2015.    Review of Systems  Constitutional: Negative for appetite change and unexpected weight change.  HENT: Negative for sinus pressure.        Minimal allergy symptoms.    Respiratory: Positive for chest tightness. Negative for cough and shortness of breath.   Cardiovascular:  Negative for palpitations and leg swelling.  Gastrointestinal: Negative for nausea, vomiting, abdominal pain and diarrhea.  Genitourinary: Negative for dysuria and difficulty urinating.  Musculoskeletal: Negative for back pain and joint swelling.  Skin: Negative for color change and rash.  Neurological: Negative for dizziness, light-headedness and headaches.  Psychiatric/Behavioral: Negative for dysphoric mood and agitation.       Objective:    Physical Exam  Constitutional: She appears well-developed and well-nourished. No distress.  HENT:  Nose: Nose normal.  Mouth/Throat: Oropharynx is clear and moist.  Neck: Neck supple. No thyromegaly present.  Cardiovascular: Normal rate and regular rhythm.   Pulmonary/Chest: Breath sounds normal. No respiratory distress. She has no wheezes.  No tenderness to palpation over the chest wall.  No reproducible tenderness to palpation.  No pain with deep breathing.  Good breath sounds bilaterally.   Abdominal: Soft. Bowel sounds are normal. There is no tenderness.  Musculoskeletal: She exhibits no edema or tenderness.  Lymphadenopathy:    She has no cervical adenopathy.  Skin: No rash noted. No erythema.  Psychiatric: She has a normal mood and affect. Her behavior is normal.    BP 138/70 mmHg  Pulse 67  Temp(Src) 98.1 F (36.7 C) (Oral)  Resp 18  Ht 5\' 3"  (1.6 m)  Wt 173 lb (78.472 kg)  BMI 30.65 kg/m2  SpO2 96% Wt Readings from Last 3 Encounters:  08/28/15 173 lb (78.472 kg)  07/12/15 174 lb 8 oz (79.153 kg)  06/28/15 176 lb 4 oz (79.946 kg)     Lab Results  Component Value Date   WBC 5.4 06/10/2015   HGB 11.9* 06/10/2015   HCT 35.7 06/10/2015   PLT 168 06/10/2015   GLUCOSE 99 06/10/2015   CHOL 185 09/14/2014   TRIG 123.0 09/14/2014   HDL 62.00 09/14/2014   LDLCALC 98 09/14/2014   ALT 22 06/08/2015   AST 26 06/08/2015   NA 137 06/10/2015   K 3.6 06/10/2015   CL 109 06/10/2015   CREATININE 0.71 06/10/2015   BUN 8  06/10/2015   CO2 22 06/10/2015   TSH 3.186 06/08/2015    Mm Digital Screening Bilateral  07/26/2015  CLINICAL DATA:  Screening. EXAM: DIGITAL SCREENING BILATERAL MAMMOGRAM WITH CAD COMPARISON:  Previous exam(s). ACR Breast Density Category b: There are scattered areas of fibroglandular density. FINDINGS: There are no findings suspicious for malignancy. Images were processed with CAD. IMPRESSION: No mammographic evidence of malignancy. A result letter of this screening mammogram will be mailed directly to the patient. RECOMMENDATION: Screening mammogram in one year. (Code:SM-B-01Y) BI-RADS CATEGORY  1: Negative. Electronically Signed   By: Lillia Mountain M.D.   On: 07/26/2015 14:25  Assessment & Plan:   Problem List Items Addressed This Visit    A-fib ([place]) - Primary    Appears to be in SR now.  On eliquis.  Followed by Dr Clayborn Bigness.  No increased heart rate or palpitations.  Follow.        Relevant Orders   Hepatic function panel   Basic metabolic panel   TSH   Anemia    Recent hgb found to be slightly decreased.  Check cbc, iron studies and B12.        Relevant Orders   CBC with Differential/Platelet   Ferritin   IBC panel   Vitamin B12   Burning sensation in lower extremity    Will check cbc and b12.  Unclear etiology.        Chest pain    See above.        Chest pressure    Describes the persistent chest pressure as outlined.  Had cardiac w/up as outlined.  Discussed f/u EKG, etc today.  She declines.  Has an appt with pulmonary in two days.  Plans to discuss.  Had previous chest CT.  This is a constant pressure.  Not reproducible on exam.  Add zantac as outlined.  Keep appt with pulmonary.        Depression    Doing better on zoloft.  Feels like this is working well for her.  Energy better.  Follow.       GERD (gastroesophageal reflux disease)    Has the chest tightness.  Unclear etiology.  Had cardiac evaluation as outlined.  Discussed EKG today.  She declines.   Has had scan.  Probiotic helps with increased gas.  Add zantac.  See if symptoms improve.  Keep appt with pulmonary.        Multiple pulmonary nodules    CT scan as outlined previously.  Has f/u planned with pulmonary in two days.            Einar Pheasant, MD

## 2015-08-28 NOTE — Progress Notes (Signed)
Pre-visit discussion using our clinic review tool. No additional management support is needed unless otherwise documented below in the visit note.  

## 2015-08-28 NOTE — Patient Instructions (Signed)
Zantac (ranitidine) 150mg - take one tablet 30 minutes before breakfast.   

## 2015-08-29 ENCOUNTER — Encounter: Payer: Self-pay | Admitting: Internal Medicine

## 2015-08-29 LAB — CBC WITH DIFFERENTIAL/PLATELET
Basophils Absolute: 0 10*3/uL (ref 0.0–0.1)
Basophils Relative: 0.6 % (ref 0.0–3.0)
EOS PCT: 1.1 % (ref 0.0–5.0)
Eosinophils Absolute: 0.1 10*3/uL (ref 0.0–0.7)
HCT: 40.2 % (ref 36.0–46.0)
HEMOGLOBIN: 13.6 g/dL (ref 12.0–15.0)
LYMPHS PCT: 32.8 % (ref 12.0–46.0)
Lymphs Abs: 2.7 10*3/uL (ref 0.7–4.0)
MCHC: 33.9 g/dL (ref 30.0–36.0)
MCV: 84.5 fl (ref 78.0–100.0)
MONOS PCT: 7.3 % (ref 3.0–12.0)
Monocytes Absolute: 0.6 10*3/uL (ref 0.1–1.0)
Neutro Abs: 4.8 10*3/uL (ref 1.4–7.7)
Neutrophils Relative %: 58.2 % (ref 43.0–77.0)
Platelets: 256 10*3/uL (ref 150.0–400.0)
RBC: 4.76 Mil/uL (ref 3.87–5.11)
RDW: 14.8 % (ref 11.5–15.5)
WBC: 8.2 10*3/uL (ref 4.0–10.5)

## 2015-08-29 LAB — BASIC METABOLIC PANEL
BUN: 12 mg/dL (ref 6–23)
CHLORIDE: 105 meq/L (ref 96–112)
CO2: 26 mEq/L (ref 19–32)
Calcium: 9.7 mg/dL (ref 8.4–10.5)
Creatinine, Ser: 0.71 mg/dL (ref 0.40–1.20)
GFR: 83.64 mL/min (ref >60.00)
Glucose, Bld: 94 mg/dL (ref 70–99)
POTASSIUM: 3.8 meq/L (ref 3.5–5.1)
SODIUM: 138 meq/L (ref 135–145)

## 2015-08-29 LAB — TSH: TSH: 2.51 u[IU]/mL (ref 0.35–4.50)

## 2015-08-29 LAB — HEPATIC FUNCTION PANEL
ALBUMIN: 4.1 g/dL (ref 3.5–5.2)
ALK PHOS: 47 U/L (ref 39–117)
ALT: 17 U/L (ref 0–35)
AST: 21 U/L (ref 0–37)
BILIRUBIN DIRECT: 0.1 mg/dL (ref 0.0–0.3)
Total Bilirubin: 0.6 mg/dL (ref 0.2–1.2)
Total Protein: 6.6 g/dL (ref 6.0–8.3)

## 2015-08-29 LAB — IBC PANEL
Iron: 50 ug/dL (ref 42–145)
SATURATION RATIOS: 12.5 % — AB (ref 20.0–50.0)
Transferrin: 285 mg/dL (ref 212.0–360.0)

## 2015-08-29 LAB — FERRITIN: FERRITIN: 26.4 ng/mL (ref 10.0–291.0)

## 2015-08-29 LAB — VITAMIN B12: VITAMIN B 12: 207 pg/mL — AB (ref 211–911)

## 2015-08-29 NOTE — Assessment & Plan Note (Signed)
See above

## 2015-08-29 NOTE — Assessment & Plan Note (Signed)
Doing better on zoloft.  Feels like this is working well for her.  Energy better.  Follow.

## 2015-08-29 NOTE — Assessment & Plan Note (Signed)
Has the chest tightness.  Unclear etiology.  Had cardiac evaluation as outlined.  Discussed EKG today.  She declines.  Has had scan.  Probiotic helps with increased gas.  Add zantac.  See if symptoms improve.  Keep appt with pulmonary.

## 2015-08-29 NOTE — Assessment & Plan Note (Signed)
Recent hgb found to be slightly decreased.  Check cbc, iron studies and B12.

## 2015-08-29 NOTE — Assessment & Plan Note (Signed)
Describes the persistent chest pressure as outlined.  Had cardiac w/up as outlined.  Discussed f/u EKG, etc today.  She declines.  Has an appt with pulmonary in two days.  Plans to discuss.  Had previous chest CT.  This is a constant pressure.  Not reproducible on exam.  Add zantac as outlined.  Keep appt with pulmonary.

## 2015-08-29 NOTE — Assessment & Plan Note (Signed)
Appears to be in SR now.  On eliquis.  Followed by Dr Clayborn Bigness.  No increased heart rate or palpitations.  Follow.

## 2015-08-29 NOTE — Assessment & Plan Note (Signed)
Will check cbc and b12.  Unclear etiology.

## 2015-08-29 NOTE — Assessment & Plan Note (Signed)
CT scan as outlined previously.  Has f/u planned with pulmonary in two days.

## 2015-08-30 ENCOUNTER — Encounter: Payer: Self-pay | Admitting: Internal Medicine

## 2015-08-30 ENCOUNTER — Telehealth: Payer: Self-pay | Admitting: Internal Medicine

## 2015-08-30 ENCOUNTER — Ambulatory Visit (INDEPENDENT_AMBULATORY_CARE_PROVIDER_SITE_OTHER): Payer: Medicare Other | Admitting: Internal Medicine

## 2015-08-30 VITALS — BP 130/76 | HR 67 | Ht 64.5 in | Wt 171.8 lb

## 2015-08-30 DIAGNOSIS — J189 Pneumonia, unspecified organism: Secondary | ICD-10-CM | POA: Diagnosis not present

## 2015-08-30 DIAGNOSIS — R911 Solitary pulmonary nodule: Secondary | ICD-10-CM

## 2015-08-30 DIAGNOSIS — J479 Bronchiectasis, uncomplicated: Secondary | ICD-10-CM | POA: Diagnosis not present

## 2015-08-30 NOTE — Telephone Encounter (Signed)
Pt called returning your call lab results.  Call pt @ [phone] Thank you!

## 2015-08-30 NOTE — Telephone Encounter (Signed)
Left message to call back & pt needs to speak with Triage instead of sending a message. She is hard to get in touch with.

## 2015-08-30 NOTE — Progress Notes (Signed)
   Subjective:    Patient ID: Bridget Briggs, female    DOB: March 03, 1931, 82 y.o.   MRN: [id number]  Synopsis: Referred in 2015 for evaluation of a pulmonary nodule. Found to have lingular and right middle lobe bronchiectasis which she attributes to her prior episode of pneumonia.  Repeat CT chest doing in February 2016 did not show changes in the small nodules which could represent indolent mycobacterial disease. CT chest 02/13/15 shows new GGO RML-likely related to recent pneumonia, RLL nodule  HPI  Chief Complaint  Patient presents with  . Follow-up    pt states breathing is baseline. pt c/o chest heaviness   Patient main complaint is chest pressure for 1 month-constant Patient has no fevers, chills, NVD. No signs of infection\ Denies SOB/DOE, no wheezing, exercises 3 times per week Patient will need to see cardiology ASAP        Review of Systems  Constitutional: Negative for fever and unexpected weight change.  HENT: Negative for congestion, dental problem, ear pain, nosebleeds, postnasal drip, rhinorrhea, sinus pressure, sneezing, sore throat and trouble swallowing.   Eyes: Negative for redness and itching.  Respiratory: Positive for chest tightness. Negative for cough, shortness of breath and wheezing.   Cardiovascular: Negative for palpitations and leg swelling.       Objective:   Physical Exam Filed Vitals:   08/30/15 1141  BP: 130/76  Pulse: 67  Height: 5' 4.5" (1.638 m)  Weight: 171 lb 12.8 oz (77.928 kg)  SpO2: 96%  RA  Gen: well appearing, no acute distress HEENT: NCAT, EOMi, OP clear, neck supple without masses PULM: CTA B CV: RRR, slight systolic murmur, no JVD AB: BS+, soft, nontender Ext: warm, no edema, no clubbing, no cyanosis Derm: no rash or skin breakdown Neuro: A&Ox4, MAEW   11/15/2013 CT chest> mild centrilobular emphysema bilaterally, there is mild lingular bronchiectasis as well as right middle lobe bronchiectasis., There is also  subpleural nodularity in the right lower lobe which is worrisome for Mycobacterium avium complex infection. February 2016 CT chest images reviewed today in clinic> no significant change in the right middle lobe nodules, and fact they're slightly smaller, right middle lobe and lingular bronchiectasis again noted. Mild centrilobular emphysema.     Assessment & Plan:  82 yo white female with h/o COPD Gold Stage A with Bronchiectasis , no signs of pneumonia at this time Patient having chest pressure will send to [facility] cardiology this afternoon   Bronchiectasis with acute COPD exacerbation-resolved -stable no acute resp symptoms at this time   Multiple pulmonary nodules -will need Repeat CT chest to assess nodules  CHEST PRESSURE-our office calling [facility] cardiology for patient to be assessed this PM   I have personally obtained a history, examined the patient, evaluated Pertinent laboratory and RadioGraphic/imaging results, and  formulated the assessment and plan The Patient requires high complexity decision making for assessment and support, frequent evaluation and titration of therapies.   Patient satisfied with Plan of action and management. All questions answered  Corrin Parker, M.D.  [facility]  Medical Director [place] Director [facility] Cardio-Pulmonary Department

## 2015-08-30 NOTE — Addendum Note (Signed)
Addended by: Oscar La R on: 08/30/2015 12:13 PM   Modules accepted: Orders

## 2015-08-30 NOTE — Patient Instructions (Signed)
Chronic Obstructive Pulmonary Disease Chronic obstructive pulmonary disease (COPD) is a common lung condition in which airflow from the lungs is limited. COPD is a general term that can be used to describe many different lung problems that limit airflow, including both chronic bronchitis and emphysema. If you have COPD, your lung function will probably never return to normal, but there are measures you can take to improve lung function and make yourself feel better. CAUSES   Smoking (common).  Exposure to secondhand smoke.  Genetic problems.  Chronic inflammatory lung diseases or recurrent infections. SYMPTOMS  Shortness of breath, especially with physical activity.  Deep, persistent (chronic) cough with a large amount of thick mucus.  Wheezing.  Rapid breaths (tachypnea).  Gray or bluish discoloration (cyanosis) of the skin, especially in your fingers, toes, or lips.  Fatigue.  Weight loss.  Frequent infections or episodes when breathing symptoms become much worse (exacerbations).  Chest tightness. DIAGNOSIS Your health care provider will take a medical history and perform a physical examination to diagnose COPD. Additional tests for COPD may include:  Lung (pulmonary) function tests.  Chest X-ray.  CT scan.  Blood tests. TREATMENT  Treatment for COPD may include:  Inhaler and nebulizer medicines. These help manage the symptoms of COPD and make your breathing more comfortable.  Supplemental oxygen. Supplemental oxygen is only helpful if you have a low oxygen level in your blood.  Exercise and physical activity. These are beneficial for nearly all people with COPD.  Lung surgery or transplant.  Nutrition therapy to gain weight, if you are underweight.  Pulmonary rehabilitation. This may involve working with a team of health care providers and specialists, such as respiratory, occupational, and physical therapists. HOME CARE INSTRUCTIONS  Take all medicines  (inhaled or pills) as directed by your health care provider.  Avoid over-the-counter medicines or cough syrups that dry up your airway (such as antihistamines) and slow down the elimination of secretions unless instructed otherwise by your health care provider.  If you are a smoker, the most important thing that you can do is stop smoking. Continuing to smoke will cause further lung damage and breathing trouble. Ask your health care provider for help with quitting smoking. He or she can direct you to community resources or hospitals that provide support.  Avoid exposure to irritants such as smoke, chemicals, and fumes that aggravate your breathing.  Use oxygen therapy and pulmonary rehabilitation if directed by your health care provider. If you require home oxygen therapy, ask your health care provider whether you should purchase a pulse oximeter to measure your oxygen level at home.  Avoid contact with individuals who have a contagious illness.  Avoid extreme temperature and humidity changes.  Eat healthy foods. Eating smaller, more frequent meals and resting before meals may help you maintain your strength.  Stay active, but balance activity with periods of rest. Exercise and physical activity will help you maintain your ability to do things you want to do.  Preventing infection and hospitalization is very important when you have COPD. Make sure to receive all the vaccines your health care provider recommends, especially the pneumococcal and influenza vaccines. Ask your health care provider whether you need a pneumonia vaccine.  Learn and use relaxation techniques to manage stress.  Learn and use controlled breathing techniques as directed by your health care provider. Controlled breathing techniques include:  Pursed lip breathing. Start by breathing in (inhaling) through your nose for 1 second. Then, purse your lips as if you were   going to whistle and breathe out (exhale) through the  pursed lips for 2 seconds.  Diaphragmatic breathing. Start by putting one hand on your abdomen just above your waist. Inhale slowly through your nose. The hand on your abdomen should move out. Then purse your lips and exhale slowly. You should be able to feel the hand on your abdomen moving in as you exhale.  Learn and use controlled coughing to clear mucus from your lungs. Controlled coughing is a series of short, progressive coughs. The steps of controlled coughing are: 1. Lean your head slightly forward. 2. Breathe in deeply using diaphragmatic breathing. 3. Try to hold your breath for 3 seconds. 4. Keep your mouth slightly open while coughing twice. 5. Spit any mucus out into a tissue. 6. Rest and repeat the steps once or twice as needed. SEEK MEDICAL CARE IF:  You are coughing up more mucus than usual.  There is a change in the color or thickness of your mucus.  Your breathing is more labored than usual.  Your breathing is faster than usual. SEEK IMMEDIATE MEDICAL CARE IF:  You have shortness of breath while you are resting.  You have shortness of breath that prevents you from:  Being able to talk.  Performing your usual physical activities.  You have chest pain lasting longer than 5 minutes.  Your skin color is more cyanotic than usual.  You measure low oxygen saturations for longer than 5 minutes with a pulse oximeter. MAKE SURE YOU:  Understand these instructions.  Will watch your condition.  Will get help right away if you are not doing well or get worse.   This information is not intended to replace advice given to you by your health care provider. Make sure you discuss any questions you have with your health care provider.   Document Released: 02/13/2005 Document Revised: 05/27/2014 Document Reviewed: 12/31/2012 Elsevier Interactive Patient Education 2016 Else[employer] Inc[employer]  

## 2015-08-31 ENCOUNTER — Other Ambulatory Visit
Admission: RE | Admit: 2015-08-31 | Discharge: 2015-08-31 | Disposition: A | Discharge status: Home / Self Care | Payer: Medicare Other | Source: Ambulatory Visit | Attending: Internal Medicine | Admitting: Internal Medicine

## 2015-08-31 DIAGNOSIS — I480 Paroxysmal atrial fibrillation: Secondary | ICD-10-CM | POA: Diagnosis not present

## 2015-08-31 DIAGNOSIS — Z01810 Encounter for preprocedural cardiovascular examination: Secondary | ICD-10-CM | POA: Diagnosis not present

## 2015-08-31 DIAGNOSIS — R079 Chest pain, unspecified: Secondary | ICD-10-CM | POA: Insufficient documentation

## 2015-08-31 DIAGNOSIS — R011 Cardiac murmur, unspecified: Secondary | ICD-10-CM | POA: Diagnosis not present

## 2015-08-31 DIAGNOSIS — F0390 Unspecified dementia without behavioral disturbance: Secondary | ICD-10-CM | POA: Diagnosis not present

## 2015-08-31 DIAGNOSIS — J40 Bronchitis, not specified as acute or chronic: Secondary | ICD-10-CM | POA: Diagnosis not present

## 2015-08-31 DIAGNOSIS — E669 Obesity, unspecified: Secondary | ICD-10-CM | POA: Diagnosis not present

## 2015-08-31 DIAGNOSIS — I208 Other forms of angina pectoris: Secondary | ICD-10-CM | POA: Diagnosis not present

## 2015-08-31 DIAGNOSIS — R938 Abnormal findings on diagnostic imaging of other specified body structures: Secondary | ICD-10-CM | POA: Diagnosis not present

## 2015-08-31 DIAGNOSIS — M1990 Unspecified osteoarthritis, unspecified site: Secondary | ICD-10-CM | POA: Diagnosis not present

## 2015-08-31 LAB — FIBRIN DERIVATIVES D-DIMER (ARMC ONLY): Fibrin derivatives D-dimer (ARMC): 381 (ref 0–499)

## 2015-09-05 ENCOUNTER — Ambulatory Visit (INDEPENDENT_AMBULATORY_CARE_PROVIDER_SITE_OTHER): Payer: Medicare Other

## 2015-09-05 DIAGNOSIS — E538 Deficiency of other specified B group vitamins: Secondary | ICD-10-CM | POA: Diagnosis not present

## 2015-09-05 MED ORDER — CYANOCOBALAMIN 1000 MCG/ML IJ SOLN
1000.0000 ug | Freq: Once | INTRAMUSCULAR | Status: AC
  Administered 2015-09-05: 1000 ug via INTRAMUSCULAR

## 2015-09-05 NOTE — Progress Notes (Signed)
patient was in receiving a B12 injection in the left deltoid. patient tolerated well.

## 2015-09-06 ENCOUNTER — Encounter
Admission: RE | Disposition: A | Discharge status: Home / Self Care | Payer: Self-pay | Source: Ambulatory Visit | Attending: Internal Medicine

## 2015-09-06 ENCOUNTER — Ambulatory Visit
Admission: RE | Admit: 2015-09-06 | Discharge: 2015-09-06 | Disposition: A | Discharge status: Home / Self Care | Payer: Medicare Other | Source: Ambulatory Visit | Attending: Internal Medicine | Admitting: Internal Medicine

## 2015-09-06 DIAGNOSIS — Z7901 Long term (current) use of anticoagulants: Secondary | ICD-10-CM | POA: Diagnosis not present

## 2015-09-06 DIAGNOSIS — R0600 Dyspnea, unspecified: Secondary | ICD-10-CM | POA: Insufficient documentation

## 2015-09-06 DIAGNOSIS — E669 Obesity, unspecified: Secondary | ICD-10-CM | POA: Diagnosis not present

## 2015-09-06 DIAGNOSIS — I480 Paroxysmal atrial fibrillation: Secondary | ICD-10-CM | POA: Insufficient documentation

## 2015-09-06 DIAGNOSIS — Z880 Allergy status to penicillin: Secondary | ICD-10-CM | POA: Insufficient documentation

## 2015-09-06 DIAGNOSIS — R0602 Shortness of breath: Secondary | ICD-10-CM | POA: Insufficient documentation

## 2015-09-06 DIAGNOSIS — M1990 Unspecified osteoarthritis, unspecified site: Secondary | ICD-10-CM | POA: Diagnosis not present

## 2015-09-06 DIAGNOSIS — R079 Chest pain, unspecified: Secondary | ICD-10-CM | POA: Diagnosis not present

## 2015-09-06 DIAGNOSIS — Z9889 Other specified postprocedural states: Secondary | ICD-10-CM | POA: Diagnosis not present

## 2015-09-06 DIAGNOSIS — F0390 Unspecified dementia without behavioral disturbance: Secondary | ICD-10-CM | POA: Insufficient documentation

## 2015-09-06 DIAGNOSIS — K219 Gastro-esophageal reflux disease without esophagitis: Secondary | ICD-10-CM | POA: Insufficient documentation

## 2015-09-06 DIAGNOSIS — I209 Angina pectoris, unspecified: Secondary | ICD-10-CM | POA: Diagnosis present

## 2015-09-06 DIAGNOSIS — Z6829 Body mass index (BMI) 29.0-29.9, adult: Secondary | ICD-10-CM | POA: Insufficient documentation

## 2015-09-06 DIAGNOSIS — Z87891 Personal history of nicotine dependence: Secondary | ICD-10-CM | POA: Insufficient documentation

## 2015-09-06 HISTORY — PX: CARDIAC CATHETERIZATION: SHX172

## 2015-09-06 SURGERY — RIGHT/LEFT HEART CATH AND CORONARY ANGIOGRAPHY

## 2015-09-06 MED ORDER — FENTANYL CITRATE (PF) 100 MCG/2ML IJ SOLN
INTRAMUSCULAR | Status: AC
  Filled 2015-09-06: qty 2

## 2015-09-06 MED ORDER — ASPIRIN 81 MG PO CHEW: 81.0000 mg | CHEWABLE_TABLET | ORAL | Status: DC

## 2015-09-06 MED ORDER — MIDAZOLAM HCL 2 MG/2ML IJ SOLN
INTRAMUSCULAR | Status: DC | PRN
  Administered 2015-09-06: 0.5 mg via INTRAVENOUS
  Administered 2015-09-06: 1 mg via INTRAVENOUS

## 2015-09-06 MED ORDER — IOPAMIDOL (ISOVUE-300) INJECTION 61%
INTRAVENOUS | Status: DC | PRN
  Administered 2015-09-06: 90 mL via INTRA_ARTERIAL

## 2015-09-06 MED ORDER — HEPARIN (PORCINE) IN NACL 2-0.9 UNIT/ML-% IJ SOLN
INTRAMUSCULAR | Status: AC
  Filled 2015-09-06: qty 1000

## 2015-09-06 MED ORDER — SODIUM CHLORIDE 0.9% FLUSH: 3.0000 mL | Freq: Two times a day (BID) | INTRAVENOUS | Status: DC

## 2015-09-06 MED ORDER — ONDANSETRON HCL 4 MG/2ML IJ SOLN: 4.0000 mg | Freq: Four times a day (QID) | INTRAMUSCULAR | Status: DC | PRN

## 2015-09-06 MED ORDER — SODIUM CHLORIDE 0.9 % IV SOLN: 250.0000 mL | INTRAVENOUS | Status: DC | PRN

## 2015-09-06 MED ORDER — SODIUM CHLORIDE 0.9 % IV SOLN
INTRAVENOUS | Status: DC
  Administered 2015-09-06: 12:00:00 via INTRAVENOUS

## 2015-09-06 MED ORDER — MIDAZOLAM HCL 2 MG/2ML IJ SOLN
INTRAMUSCULAR | Status: AC
  Filled 2015-09-06: qty 2

## 2015-09-06 MED ORDER — SODIUM CHLORIDE 0.9 % WEIGHT BASED INFUSION: 3.0000 mL/kg/h | INTRAVENOUS | Status: DC

## 2015-09-06 MED ORDER — SODIUM CHLORIDE 0.9% FLUSH: 3.0000 mL | INTRAVENOUS | Status: DC | PRN

## 2015-09-06 MED ORDER — ACETAMINOPHEN 325 MG PO TABS: 650.0000 mg | ORAL_TABLET | ORAL | Status: DC | PRN

## 2015-09-06 MED ORDER — FENTANYL CITRATE (PF) 100 MCG/2ML IJ SOLN
INTRAMUSCULAR | Status: DC | PRN
  Administered 2015-09-06 (×3): 25 ug via INTRAVENOUS

## 2015-09-06 SURGICAL SUPPLY — 13 items
CATH INFINITI 5FR ANG PIGTAIL (CATHETERS) ×2 IMPLANT
CATH INFINITI 5FR JL4 (CATHETERS) ×2 IMPLANT
CATH INFINITI JR4 5F (CATHETERS) ×2 IMPLANT
CATH SWANZ 7F THERMO (CATHETERS) ×2 IMPLANT
DEVICE CLOSURE MYNXGRIP 5F (Vascular Products) IMPLANT
GUIDEWIRE EMER 3M J .025X150CM (WIRE) ×2 IMPLANT
KIT MANI 3VAL PERCEP (MISCELLANEOUS) ×2 IMPLANT
KIT RIGHT HEART (MISCELLANEOUS) ×2 IMPLANT
NEEDLE PERC 18GX7CM (NEEDLE) ×2 IMPLANT
PACK CARDIAC CATH (CUSTOM PROCEDURE TRAY) ×2 IMPLANT
SHEATH AVANTI 5FR X 11CM (SHEATH) ×2 IMPLANT
SHEATH PINNACLE 7F 10CM (SHEATH) ×2 IMPLANT
WIRE EMERALD 3MM-J .035X150CM (WIRE) ×2 IMPLANT

## 2015-09-06 NOTE — Progress Notes (Signed)
Pt clinically stable post heart cath, right groin without bleeding nor hematoma, discharge teaching done with return appt. Given, denies complaints at present.

## 2015-09-06 NOTE — Discharge Instructions (Signed)
Angiogram, Care After °Refer to this sheet in the next few weeks. These instructions provide you with information about caring for yourself after your procedure. Your health care provider may also give you more specific instructions. Your treatment has been planned according to current medical practices, but problems sometimes occur. Call your health care provider if you have any problems or questions after your procedure. °WHAT TO EXPECT AFTER THE PROCEDURE °After your procedure, it is typical to have the following: °· Bruising at the catheter insertion site that usually fades within 1-2 weeks. °· Blood collecting in the tissue (hematoma) that may be painful to the touch. It should usually decrease in size and tenderness within 1-2 weeks. °HOME CARE INSTRUCTIONS °· Take medicines only as directed by your health care provider. °· You may shower 24-48 hours after the procedure or as directed by your health care provider. Remove the bandage (dressing) and gently wash the site with plain soap and water. Pat the area dry with a clean towel. Do not rub the site, because this may cause bleeding. °· Do not take baths, swim, or use a hot tub until your health care provider approves. °· Check your insertion site every day for redness, swelling, or drainage. °· Do not apply powder or lotion to the site. °· Do not lift over 10 lb (4.5 kg) for 5 days after your procedure or as directed by your health care provider. °· Ask your health care provider when it is okay to: °¨ Return to work or school. °¨ Resume usual physical activities or sports. °¨ Resume sexual activity. °· Do not drive home if you are discharged the same day as the procedure. Have someone else drive you. °· You may drive 24 hours after the procedure unless otherwise instructed by your health care provider. °· Do not operate machinery or power tools for 24 hours after the procedure or as directed by your health care provider. °· If your procedure was done as an  outpatient procedure, which means that you went home the same day as your procedure, a responsible adult should be with you for the first 24 hours after you arrive home. °· Keep all follow-up visits as directed by your health care provider. This is important. °SEEK MEDICAL CARE IF: °· You have a fever. °· You have chills. °· You have increased bleeding from the catheter insertion site. Hold pressure on the site. °SEEK IMMEDIATE MEDICAL CARE IF: °· You have unusual pain at the catheter insertion site. °· You have redness, warmth, or swelling at the catheter insertion site. °· You have drainage (other than a small amount of blood on the dressing) from the catheter insertion site. °· The catheter insertion site is bleeding, and the bleeding does not stop after 30 minutes of holding steady pressure on the site. °· The area near or just beyond the catheter insertion site becomes pale, cool, tingly, or numb. °  °This information is not intended to replace advice given to you by your health care provider. Make sure you discuss any questions you have with your health care provider. °  °Document Released: 11/22/2004 Document Revised: 05/27/2014 Document Reviewed: 10/07/2012 °Elsevier Interactive Patient Education ©2016 Elsevier Inc. ° °

## 2015-09-07 ENCOUNTER — Encounter: Payer: Self-pay | Admitting: Internal Medicine

## 2015-09-13 DIAGNOSIS — I208 Other forms of angina pectoris: Secondary | ICD-10-CM | POA: Diagnosis not present

## 2015-09-13 DIAGNOSIS — F0390 Unspecified dementia without behavioral disturbance: Secondary | ICD-10-CM | POA: Diagnosis not present

## 2015-09-13 DIAGNOSIS — R938 Abnormal findings on diagnostic imaging of other specified body structures: Secondary | ICD-10-CM | POA: Diagnosis not present

## 2015-09-13 DIAGNOSIS — I480 Paroxysmal atrial fibrillation: Secondary | ICD-10-CM | POA: Diagnosis not present

## 2015-09-13 DIAGNOSIS — R079 Chest pain, unspecified: Secondary | ICD-10-CM | POA: Diagnosis not present

## 2015-09-13 DIAGNOSIS — M1990 Unspecified osteoarthritis, unspecified site: Secondary | ICD-10-CM | POA: Diagnosis not present

## 2015-09-13 DIAGNOSIS — R011 Cardiac murmur, unspecified: Secondary | ICD-10-CM | POA: Diagnosis not present

## 2015-09-13 DIAGNOSIS — J40 Bronchitis, not specified as acute or chronic: Secondary | ICD-10-CM | POA: Diagnosis not present

## 2015-09-13 DIAGNOSIS — Z01810 Encounter for preprocedural cardiovascular examination: Secondary | ICD-10-CM | POA: Diagnosis not present

## 2015-09-13 DIAGNOSIS — E669 Obesity, unspecified: Secondary | ICD-10-CM | POA: Diagnosis not present

## 2015-10-10 ENCOUNTER — Ambulatory Visit: Payer: Medicare Other

## 2015-10-12 ENCOUNTER — Ambulatory Visit: Payer: Medicare Other | Admitting: Internal Medicine

## 2015-10-17 ENCOUNTER — Ambulatory Visit (INDEPENDENT_AMBULATORY_CARE_PROVIDER_SITE_OTHER): Payer: Medicare Other | Admitting: *Deleted

## 2015-10-17 DIAGNOSIS — E538 Deficiency of other specified B group vitamins: Secondary | ICD-10-CM | POA: Diagnosis not present

## 2015-10-17 MED ORDER — CYANOCOBALAMIN 1000 MCG/ML IJ SOLN
1000.0000 ug | Freq: Once | INTRAMUSCULAR | Status: AC
  Administered 2015-10-17: 1000 ug via INTRAMUSCULAR

## 2015-10-26 ENCOUNTER — Ambulatory Visit
Admission: RE | Admit: 2015-10-26 | Discharge: 2015-10-26 | Disposition: A | Discharge status: Home / Self Care | Payer: Medicare Other | Source: Ambulatory Visit | Attending: Internal Medicine | Admitting: Internal Medicine

## 2015-10-26 DIAGNOSIS — J479 Bronchiectasis, uncomplicated: Secondary | ICD-10-CM | POA: Diagnosis not present

## 2015-10-26 DIAGNOSIS — I2510 Atherosclerotic heart disease of native coronary artery without angina pectoris: Secondary | ICD-10-CM | POA: Diagnosis not present

## 2015-10-26 DIAGNOSIS — R911 Solitary pulmonary nodule: Secondary | ICD-10-CM

## 2015-10-26 DIAGNOSIS — J439 Emphysema, unspecified: Secondary | ICD-10-CM | POA: Insufficient documentation

## 2015-10-26 DIAGNOSIS — J432 Centrilobular emphysema: Secondary | ICD-10-CM | POA: Diagnosis not present

## 2015-10-26 DIAGNOSIS — J189 Pneumonia, unspecified organism: Secondary | ICD-10-CM

## 2015-10-27 ENCOUNTER — Encounter: Payer: Self-pay | Admitting: Internal Medicine

## 2015-10-27 ENCOUNTER — Ambulatory Visit (INDEPENDENT_AMBULATORY_CARE_PROVIDER_SITE_OTHER): Payer: Medicare Other | Admitting: Internal Medicine

## 2015-10-27 VITALS — BP 130/70 | HR 72 | Temp 97.8°F | Wt 172.4 lb

## 2015-10-27 DIAGNOSIS — E538 Deficiency of other specified B group vitamins: Secondary | ICD-10-CM

## 2015-10-27 DIAGNOSIS — J479 Bronchiectasis, uncomplicated: Secondary | ICD-10-CM

## 2015-10-27 DIAGNOSIS — I480 Paroxysmal atrial fibrillation: Secondary | ICD-10-CM

## 2015-10-27 DIAGNOSIS — F32A Depression, unspecified: Secondary | ICD-10-CM

## 2015-10-27 DIAGNOSIS — F329 Major depressive disorder, single episode, unspecified: Secondary | ICD-10-CM

## 2015-10-27 DIAGNOSIS — R918 Other nonspecific abnormal finding of lung field: Secondary | ICD-10-CM

## 2015-10-27 DIAGNOSIS — D649 Anemia, unspecified: Secondary | ICD-10-CM | POA: Diagnosis not present

## 2015-10-27 NOTE — Progress Notes (Signed)
Pre visit review using our clinic review tool, if applicable. No additional management support is needed unless otherwise documented below in the visit note. 

## 2015-10-27 NOTE — Progress Notes (Signed)
Patient ID: Bridget Briggs, female   DOB: 1931-01-05, 82 y.o.   MRN: [id number]   Subjective:    Patient ID: Bridget Briggs, female    DOB: 04-21-1931, 82 y.o.   MRN: [id number]  HPI  Patient here for a scheduled follow up.  Recently saw Dr Clayborn Bigness.  Notes reviewed.  Had recent cath - "reasonably normal".  Had negative stress test 02/2015.  Felt things stable from cardiac standpoint.  Recommended f/u in one year.  She is exercising.  Goes to the [facility] three days per week.  She does report some increased sweating with exercise.  No pain or other symptoms.  Resolves quickly.  No nausea or vomiting.  Bowels stable.  Due to f/u with pulmonary next week.     Past Medical History  Diagnosis Date  . GERD (gastroesophageal reflux disease)   . Arthritis   . Depression   . History of chicken pox   . Glaucoma   . Hx: UTI (urinary tract infection)   . History of colon polyps    Past Surgical History  Procedure Laterality Date  . Cholecystectomy  1996  . Appendectomy  1974  . Tonsillectomy and adenoidectomy  1940  . Breast surgery Right 1988    biopsy  . Abdominal hysterectomy  1974    left ovary removed  . Ankle surgery Left 1998  . Nissen fundoplication  [id number]  . Eye surgery Bilateral 2005/2006    cataract  . Laparoscopic gastric banding with hiatal hernia repair  2000  . Breast biopsy Right 1988    EXCISIONAL - NEG  . Cardiac catheterization N/A 09/06/2015    Procedure: Right/Left Heart Cath and Coronary Angiography;  Surgeon: Yolonda Kida, MD;  Location: [facility];  Service: Cardiovascular;  Laterality: N/A;   Family History  Problem Relation Age of Onset  . Arthritis Mother   . Leukemia Mother   . Lung cancer Father   . Osteoporosis Other   . Breast cancer Neg Hx    Social History   Social History  . Marital Status: Married    Spouse Name: N/A  . Number of Children: N/A  . Years of Education: N/A   Social History Main Topics  . Smoking status: Former  Smoker -- 1.00 packs/day for 30 years    Types: Cigarettes    Quit date: 05/20/1988  . Smokeless tobacco: Never Used  . Alcohol Use: No  . Drug Use: No  . Sexual Activity: No   Other Topics Concern  . None   Social History Narrative    Outpatient Encounter Prescriptions as of 10/27/2015  Medication Sig  . apixaban (ELIQUIS) 5 MG TABS tablet Take 1 tablet (5 mg total) by mouth 2 (two) times daily.  . beta carotene w/minerals (OCUVITE) tablet Take 1 tablet by mouth daily.  . calcium-vitamin D (OSCAL WITH D) 500-200 MG-UNIT per tablet Take 1 tablet by mouth daily.  . diphenhydramine-acetaminophen (TYLENOL PM) 25-500 MG TABS Take 1 tablet by mouth at bedtime as needed.  . dorzolamide-timolol (COSOPT) 22.3-6.8 MG/ML ophthalmic solution Place 1 drop into both eyes 2 (two) times daily.  . fluocinolone (SYNALAR) 0.01 % external solution Apply topically 2 (two) times daily as needed.  . latanoprost (XALATAN) 0.005 % ophthalmic solution Place 1 drop into both eyes at bedtime.  . vitamin D, CHOLECALCIFEROL, 400 UNITS tablet Take 400 Units by mouth daily.  . sertraline (ZOLOFT) 50 MG tablet Take 1 tablet (50 mg total) by mouth daily. (  Patient not taking: Reported on 10/27/2015)   No facility-administered encounter medications on file as of 10/27/2015.    Review of Systems  Constitutional: Negative for appetite change and unexpected weight change.  HENT: Negative for congestion and sinus pressure.   Respiratory: Negative for cough, chest tightness and shortness of breath.   Cardiovascular: Negative for chest pain, palpitations and leg swelling.  Gastrointestinal: Negative for nausea, vomiting, abdominal pain and diarrhea.  Genitourinary: Negative for dysuria and difficulty urinating.  Musculoskeletal: Negative for back pain and joint swelling.  Skin: Negative for color change and rash.  Neurological: Negative for dizziness and light-headedness.  Psychiatric/Behavioral: Negative for dysphoric mood  and agitation.       Objective:    Physical Exam  Constitutional: She appears well-developed and well-nourished. No distress.  HENT:  Nose: Nose normal.  Mouth/Throat: Oropharynx is clear and moist.  Neck: Neck supple. No thyromegaly present.  Cardiovascular: Normal rate and regular rhythm.   Pulmonary/Chest: Breath sounds normal. No respiratory distress. She has no wheezes.  Abdominal: Soft. Bowel sounds are normal. There is no tenderness.  Musculoskeletal: She exhibits no edema or tenderness.  Lymphadenopathy:    She has no cervical adenopathy.  Skin: No rash noted. No erythema.  Psychiatric: She has a normal mood and affect. Her behavior is normal.    BP 130/70 mmHg  Pulse 72  Temp(Src) 97.8 F (36.6 C) (Oral)  Wt 172 lb 6.4 oz (78.2 kg) Wt Readings from Last 3 Encounters:  10/27/15 172 lb 6.4 oz (78.2 kg)  09/06/15 171 lb (77.565 kg)  08/30/15 171 lb 12.8 oz (77.928 kg)     Lab Results  Component Value Date   WBC 8.2 08/28/2015   HGB 13.6 08/28/2015   HCT 40.2 08/28/2015   PLT 256.0 08/28/2015   GLUCOSE 94 08/28/2015   CHOL 185 09/14/2014   TRIG 123.0 09/14/2014   HDL 62.00 09/14/2014   LDLCALC 98 09/14/2014   ALT 17 08/28/2015   AST 21 08/28/2015   NA 138 08/28/2015   K 3.8 08/28/2015   CL 105 08/28/2015   CREATININE 0.71 08/28/2015   BUN 12 08/28/2015   CO2 26 08/28/2015   TSH 2.51 08/28/2015    Ct Chest Wo Contrast  10/26/2015  CLINICAL DATA:  82 year old female with history of pulmonary nodule. Prior history of pneumonia. Former smoker (quit 26 years ago). EXAM: CT CHEST WITHOUT CONTRAST TECHNIQUE: Multidetector CT imaging of the chest was performed following the standard protocol without IV contrast. COMPARISON:  Chest CT 02/16/2015. FINDINGS: Mediastinum/Lymph Nodes: Heart size is normal. There is no significant pericardial fluid, thickening or pericardial calcification. There is atherosclerosis of the thoracic aorta, the great vessels of the  mediastinum and the coronary arteries, including calcified atherosclerotic plaque in the left main, left anterior descending and right coronary arteries. No pathologically enlarged mediastinal or hilar lymph nodes. Please note that accurate exclusion of hilar adenopathy is limited on noncontrast CT scans. Esophagus is unremarkable in appearance. No axillary lymphadenopathy. Lungs/Pleura: Mild diffuse bronchial wall thickening. Mild centrilobular and paraseptal emphysema. Bilateral apical pleuroparenchymal thickening, most compatible with chronic post infectious or inflammatory scarring. Scattered throughout the lungs bilaterally there are patchy areas of cylindrical and mild varicose bronchiectasis, most evident in the right middle lobe and inferior segment of the lingula. Associated with these areas there are some areas of architectural distortion and chronic scarring. Additionally, there is extensive peribronchovascular micronodularity, likely to reflect areas of chronic mucoid impaction within terminal bronchioles. The nodular opacity in  the medial segment of the right middle lobe seen on the prior study appears to correspond to a dilated bronchus, which is no longer filled with mucus on today's examination. Other than the widespread micronodularity there are no larger more clearly concerning pulmonary nodules or masses noted. No acute consolidative airspace disease. No pleural effusions. Upper Abdomen: Ovoid shaped 2.7 x 4.1 cm low-attenuation lesion in segments 7 and 8 of the liver near the dome, similar to prior study, previously characterized as a cavernous hemangioma. Atherosclerosis. Exophytic low-attenuation lesion in the upper pole of the left kidney, incompletely characterized, but similar to prior studies, likely a small cyst. Musculoskeletal/Soft Tissues: There are no aggressive appearing lytic or blastic lesions noted in the visualized portions of the skeleton. IMPRESSION: 1. The nodule of concern in  the medial segment of the right middle lobe on the prior study appeared to represent some mucoid impaction within a dilated bronchus. There is a spectrum of findings in the lungs bilaterally suggestive of a chronic indolent atypical infectious process such is mycobacterium avium intracellulare (MAI), as detailed above, which likely accounts for the nodule of concern on the prior study as well as the innumerable small micronodules on today's examination. 2. Mild diffuse bronchial wall thickening with mild centrilobular and paraseptal emphysema; imaging findings suggestive of underlying COPD. 3. Atherosclerosis, including left main and 2 vessel coronary artery disease. 4. Additional incidental findings, as above. Electronically Signed   By: Vinnie Langton M.D.   On: 10/26/2015 12:20       Assessment & Plan:   Problem List Items Addressed This Visit    A-fib ([place])    Appears to be in SR.  Followed by Dr Clayborn Bigness.  Stable.        Anemia    Follow cbc.       B12 deficiency    Continue B12 injections.       Bronchiectasis without acute exacerbation ([place])    Due to f/u with pulmonary next week.  Breathing stable.       Depression - Primary    Doing well off zoloft.  Follow.        Multiple pulmonary nodules    CT scan as outlined.  Due to f/u with pulmonary next week.           Einar Pheasant, MD

## 2015-10-29 ENCOUNTER — Encounter: Payer: Self-pay | Admitting: Internal Medicine

## 2015-10-29 DIAGNOSIS — E538 Deficiency of other specified B group vitamins: Secondary | ICD-10-CM | POA: Insufficient documentation

## 2015-10-29 NOTE — Assessment & Plan Note (Signed)
Appears to be in SR.  Followed by Dr Clayborn Bigness.  Stable.

## 2015-10-29 NOTE — Assessment & Plan Note (Signed)
CT scan as outlined.  Due to f/u with pulmonary next week.

## 2015-10-29 NOTE — Assessment & Plan Note (Signed)
Continue B12 injections.   

## 2015-10-29 NOTE — Assessment & Plan Note (Signed)
Doing well off zoloft.  Follow.

## 2015-10-29 NOTE — Assessment & Plan Note (Signed)
Due to f/u with pulmonary next week.  Breathing stable.

## 2015-10-29 NOTE — Assessment & Plan Note (Signed)
Follow cbc.  

## 2015-10-30 ENCOUNTER — Ambulatory Visit: Payer: Medicare Other | Admitting: Internal Medicine

## 2015-11-23 ENCOUNTER — Ambulatory Visit (INDEPENDENT_AMBULATORY_CARE_PROVIDER_SITE_OTHER): Payer: Medicare Other

## 2015-11-23 DIAGNOSIS — E538 Deficiency of other specified B group vitamins: Secondary | ICD-10-CM | POA: Diagnosis not present

## 2015-11-23 MED ORDER — CYANOCOBALAMIN 1000 MCG/ML IJ SOLN
1000.0000 ug | Freq: Once | INTRAMUSCULAR | Status: AC
  Administered 2015-11-23: 1000 ug via INTRAMUSCULAR

## 2015-11-23 NOTE — Progress Notes (Signed)
Patient came in for a b12 injection.  Received in Right deltoid.  Patient tolerated well.

## 2015-12-06 ENCOUNTER — Encounter: Payer: Self-pay | Admitting: Internal Medicine

## 2015-12-06 ENCOUNTER — Ambulatory Visit (INDEPENDENT_AMBULATORY_CARE_PROVIDER_SITE_OTHER): Payer: Medicare Other | Admitting: Internal Medicine

## 2015-12-06 VITALS — BP 128/70 | HR 71 | Ht 64.0 in | Wt 177.8 lb

## 2015-12-06 DIAGNOSIS — R918 Other nonspecific abnormal finding of lung field: Secondary | ICD-10-CM | POA: Diagnosis not present

## 2015-12-06 NOTE — Progress Notes (Signed)
   Subjective:    Patient ID: Bridget Briggs, female    DOB: 08-Jul-1930, 82 y.o.   MRN: [id number]  Synopsis: Referred in 2015 for evaluation of a pulmonary nodule. Found to have lingular and right middle lobe bronchiectasis which she attributes to her prior episode of pneumonia.  Repeat CT chest doing in February 2016 did not show changes in the small nodules which could represent indolent mycobacterial disease. CT chest 02/13/15 shows new GGO RML-likely related to recent pneumonia, RLL nodule CT chest  11/05/15 shows RML noldules appeared to represent some mucoid impaction within a dilated bronchus  HPI  Chief Complaint  Patient presents with  . Follow-up   No chest pain, no sign of infection at this time CT chest reviewed with patient Denies SOB/DOE, no wheezing, exercises 3 times per week  CT chest as reviewed below with patient 1. The nodule of concern in the medial segment of the right middle lobe on the prior study appeared to represent some mucoid impaction within a dilated bronchus.  2. Mild diffuse bronchial wall thickening with mild centrilobular and paraseptal emphysema; imaging findings suggestive of underlying COPD.       Review of Systems  Constitutional: Negative for fever and unexpected weight change.  HENT: Negative for congestion, dental problem, ear pain, nosebleeds, postnasal drip, rhinorrhea, sinus pressure, sneezing, sore throat and trouble swallowing.   Eyes: Negative for redness and itching.  Respiratory: Negative for cough, chest tightness, shortness of breath and wheezing.   Cardiovascular: Negative for palpitations and leg swelling.       Objective:   Physical Exam   BP 128/70 mmHg  Pulse 71  Ht 5\' 4"  (1.626 m)  Wt 177 lb 12.8 oz (80.65 kg)  BMI 30.50 kg/m2  SpO2 98%   Gen: well appearing, no acute distress HEENT: NCAT, EOMi, OP clear, neck supple without masses PULM: CTA B CV: RRR, slight systolic murmur, no JVD AB: BS+, soft,  nontender Ext: warm, no edema, no clubbing, no cyanosis Derm: no rash or skin breakdown Neuro: A&Ox4, MAEW   11/15/2013 CT chest> mild centrilobular emphysema bilaterally, there is mild lingular bronchiectasis as well as right middle lobe bronchiectasis., There is also subpleural nodularity in the right lower lobe which is worrisome for Mycobacterium avium complex infection. February 2016 CT chest images reviewed today in clinic> no significant change in the right middle lobe nodules, and fact they're slightly smaller, right middle lobe and lingular bronchiectasis again noted. Mild centrilobular emphysema.  11/05/2015 The nodule of concern in the medial segment of the right middle lobe on the prior study appeared to represent some mucoid impaction within a dilated bronchus.  2. Mild diffuse bronchial wall thickening with mild centrilobular and paraseptal emphysema; imaging findings suggestive of underlying COPD.    Assessment & Plan:  82 yo white female with h/o COPD Gold Stage A with Bronchiectasis , no signs of pneumonia at this time    Bronchiectasis with acute COPD exacerbation-resolved -stable no acute resp symptoms at this time   Multiple pulmonary nodules-re-assurance provided to patient -will need Repeat CT chest to assess nodules in 6 months      The Patient requires high complexity decision making for assessment and support, frequent evaluation and titration of therapies. Patient satisfied with Plan of action and management. All questions answered  Corrin Parker, M.D.  [facility]  Medical Director [place] Director [facility] Cardio-Pulmonary Department

## 2015-12-06 NOTE — Patient Instructions (Signed)
Follow up in 6 months with Repeat CT chest  Pulmonary Nodule A pulmonary nodule is a small, round growth of tissue in the lung. Pulmonary nodules can range in size from less than 1/5 inch (4 mm) to a little bigger than an inch (25 mm). Most pulmonary nodules are detected when imaging tests of the lung are being performed for a different problem. Pulmonary nodules are usually not cancerous (benign). However, some pulmonary nodules are cancerous (malignant). Follow-up treatment or testing is based on the size of the pulmonary nodule and your risk of getting lung cancer.  CAUSES Benign pulmonary nodules can be caused by various things. Some of the causes include:   Bacterial, fungal, or viral infections. This is usually an old infection that is no longer active, but it can sometimes be a current, active infection.  A benign mass of tissue.  Inflammation from conditions such as rheumatoid arthritis.   Abnormal blood vessels in the lungs. Malignant pulmonary nodules can result from lung cancer or from cancers that spread to the lung from other places in the body. SIGNS AND SYMPTOMS Pulmonary nodules usually do not cause symptoms. DIAGNOSIS Most often, pulmonary nodules are found incidentally when an X-ray or CT scan is performed to look for some other problem in the lung area. To help determine whether a pulmonary nodule is benign or malignant, your health care provider will take a medical history and order a variety of tests. Tests done may include:   Blood tests.  A skin test called a tuberculin test. This test is used to determine if you have been exposed to the germ that causes tuberculosis.   Chest X-rays. If possible, a new X-ray may be compared with X-rays you have had in the past.   CT scan. This test shows smaller pulmonary nodules more clearly than an X-ray.   Positron emission tomography (PET) scan. In this test, a safe amount of a radioactive substance is injected into the  bloodstream. Then, the scan takes a picture of the pulmonary nodule. The radioactive substance is eliminated from your body in your urine.   Biopsy. A tiny piece of the pulmonary nodule is removed so it can be checked under a microscope. TREATMENT  Pulmonary nodules that are benign normally do not require any treatment because they usually do not cause symptoms or breathing problems. Your health care provider may want to monitor the pulmonary nodule through follow-up CT scans. The frequency of these CT scans will vary based on the size of the nodule and the risk factors for lung cancer. For example, CT scans will need to be done more frequently if the pulmonary nodule is larger and if you have a history of smoking and a family history of cancer. Further testing or biopsies may be done if any follow-up CT scan shows that the size of the pulmonary nodule has increased. HOME CARE INSTRUCTIONS  Only take over-the-counter or prescription medicines as directed by your health care provider.  Keep all follow-up appointments with your health care provider. SEEK MEDICAL CARE IF:  You have trouble breathing when you are active.   You feel sick or unusually tired.   You do not feel like eating.   You lose weight without trying to.   You develop chills or night sweats.  SEEK IMMEDIATE MEDICAL CARE IF:  You cannot catch your breath, or you begin wheezing.   You cannot stop coughing.   You cough up blood.   You become dizzy or feel  like you are going to pass out.   You have sudden chest pain.   You have a fever or persistent symptoms for more than 2-3 days.   You have a fever and your symptoms suddenly get worse. MAKE SURE YOU:  Understand these instructions.  Will watch your condition.  Will get help right away if you are not doing well or get worse.   This information is not intended to replace advice given to you by your health care provider. Make sure you discuss any  questions you have with your health care provider.   Document Released: 03/03/2009 Document Revised: 01/06/2013 Document Reviewed: 10/26/2012 Elsevier Interactive Patient Education [employer].

## 2015-12-27 DIAGNOSIS — H40153 Residual stage of open-angle glaucoma, bilateral: Secondary | ICD-10-CM | POA: Diagnosis not present

## 2016-02-08 ENCOUNTER — Ambulatory Visit (INDEPENDENT_AMBULATORY_CARE_PROVIDER_SITE_OTHER): Payer: Medicare Other | Admitting: Internal Medicine

## 2016-02-08 ENCOUNTER — Encounter: Payer: Self-pay | Admitting: Internal Medicine

## 2016-02-08 VITALS — BP 124/68 | HR 69 | Temp 98.4°F | Ht 64.0 in | Wt 183.8 lb

## 2016-02-08 DIAGNOSIS — Z0000 Encounter for general adult medical examination without abnormal findings: Secondary | ICD-10-CM

## 2016-02-08 DIAGNOSIS — I6529 Occlusion and stenosis of unspecified carotid artery: Secondary | ICD-10-CM

## 2016-02-08 DIAGNOSIS — F32A Depression, unspecified: Secondary | ICD-10-CM

## 2016-02-08 DIAGNOSIS — E538 Deficiency of other specified B group vitamins: Secondary | ICD-10-CM

## 2016-02-08 DIAGNOSIS — M810 Age-related osteoporosis without current pathological fracture: Secondary | ICD-10-CM | POA: Diagnosis not present

## 2016-02-08 DIAGNOSIS — J479 Bronchiectasis, uncomplicated: Secondary | ICD-10-CM

## 2016-02-08 DIAGNOSIS — F329 Major depressive disorder, single episode, unspecified: Secondary | ICD-10-CM | POA: Diagnosis not present

## 2016-02-08 DIAGNOSIS — I480 Paroxysmal atrial fibrillation: Secondary | ICD-10-CM

## 2016-02-08 DIAGNOSIS — R918 Other nonspecific abnormal finding of lung field: Secondary | ICD-10-CM | POA: Diagnosis not present

## 2016-02-08 DIAGNOSIS — D649 Anemia, unspecified: Secondary | ICD-10-CM

## 2016-02-08 NOTE — Progress Notes (Signed)
Patient ID: GUILDA PETITT, female   DOB: April 28, 1931, 82 y.o.   MRN: [id number]   Subjective:    Patient ID: FANY EIGSTI, female    DOB: Sep 04, 1930, 82 y.o.   MRN: [id number]  HPI  Patient here for a scheduled follow up.  She feels better.  Breathing better.  Going to the [facility] three days per week and walking.  No chest pain.  Eating and drinking well.  No nausea or vomiting.  Bowels stable.  Seeing Dr Mortimer Fries.  Just evaluated in 11/2015.  Diagnosed with bronchiectasis.  Recommended f/u CT in 6 months.     Past Medical History:  Diagnosis Date  . Arthritis   . Depression   . GERD (gastroesophageal reflux disease)   . Glaucoma   . History of chicken pox   . History of colon polyps   . Hx: UTI (urinary tract infection)    Past Surgical History:  Procedure Laterality Date  . ABDOMINAL HYSTERECTOMY  1974   left ovary removed  . ANKLE SURGERY Left 1998  . APPENDECTOMY  1974  . BREAST BIOPSY Right 1988   EXCISIONAL - NEG  . BREAST SURGERY Right 1988   biopsy  . CARDIAC CATHETERIZATION N/A 09/06/2015   Procedure: Right/Left Heart Cath and Coronary Angiography;  Surgeon: Yolonda Kida, MD;  Location: [place];  Service: Cardiovascular;  Laterality: N/A;  . CHOLECYSTECTOMY  1996  . EYE SURGERY Bilateral 2005/2006   cataract  . LAPAROSCOPIC GASTRIC BANDING WITH HIATAL HERNIA REPAIR  2000  . NISSEN FUNDOPLICATION  [id number]  . TONSILLECTOMY AND ADENOIDECTOMY  1940   Family History  Problem Relation Age of Onset  . Arthritis Mother   . Leukemia Mother   . Lung cancer Father   . Osteoporosis Other   . Breast cancer Neg Hx    Social History   Social History  . Marital status: Married    Spouse name: N/A  . Number of children: N/A  . Years of education: N/A   Social History Main Topics  . Smoking status: Former Smoker    Packs/day: 1.00    Years: 30.00    Types: Cigarettes    Quit date: 05/20/1988  . Smokeless tobacco: Never Used  . Alcohol use No  . Drug use: No   . Sexual activity: No   Other Topics Concern  . None   Social History Narrative  . None    Outpatient Encounter Prescriptions as of 02/08/2016  Medication Sig  . apixaban (ELIQUIS) 5 MG TABS tablet Take 1 tablet (5 mg total) by mouth 2 (two) times daily.  . beta carotene w/minerals (OCUVITE) tablet Take 1 tablet by mouth daily.  . calcium-vitamin D (OSCAL WITH D) 500-200 MG-UNIT per tablet Take 1 tablet by mouth daily.  . diphenhydramine-acetaminophen (TYLENOL PM) 25-500 MG TABS Take 1 tablet by mouth at bedtime as needed.  . dorzolamide-timolol (COSOPT) 22.3-6.8 MG/ML ophthalmic solution Place 1 drop into both eyes 2 (two) times daily.  . fluocinolone (SYNALAR) 0.01 % external solution Apply topically 2 (two) times daily as needed.  . latanoprost (XALATAN) 0.005 % ophthalmic solution Place 1 drop into both eyes at bedtime.  . sertraline (ZOLOFT) 50 MG tablet Take 1 tablet (50 mg total) by mouth daily.  . vitamin D, CHOLECALCIFEROL, 400 UNITS tablet Take 400 Units by mouth daily.   No facility-administered encounter medications on file as of 02/08/2016.     Review of Systems  Constitutional: Negative for appetite change  and unexpected weight change.  HENT: Negative for congestion and sinus pressure.   Respiratory: Negative for cough and chest tightness.        No increased sob.   Cardiovascular: Negative for chest pain, palpitations and leg swelling.  Gastrointestinal: Negative for abdominal pain, diarrhea, nausea and vomiting.  Genitourinary: Negative for difficulty urinating and dysuria.  Musculoskeletal: Negative for back pain and joint swelling.  Skin: Negative for color change and rash.  Neurological: Negative for dizziness, light-headedness and headaches.  Psychiatric/Behavioral: Negative for agitation and dysphoric mood.       Objective:    Physical Exam  Constitutional: She is oriented to person, place, and time. She appears well-developed and well-nourished. No  distress.  HENT:  Nose: Nose normal.  Mouth/Throat: Oropharynx is clear and moist.  Eyes: Right eye exhibits no discharge. Left eye exhibits no discharge. No scleral icterus.  Neck: Neck supple. No thyromegaly present.  Cardiovascular: Normal rate and regular rhythm.   Pulmonary/Chest: Breath sounds normal. No accessory muscle usage. No tachypnea. No respiratory distress. She has no decreased breath sounds. She has no wheezes. She has no rhonchi. Right breast exhibits no inverted nipple, no mass, no nipple discharge and no tenderness (no axillary adenopathy). Left breast exhibits no inverted nipple, no mass, no nipple discharge and no tenderness (no axilarry adenopathy).  Abdominal: Soft. Bowel sounds are normal. There is no tenderness.  Musculoskeletal: She exhibits no edema or tenderness.  Lymphadenopathy:    She has no cervical adenopathy.  Neurological: She is alert and oriented to person, place, and time.  Skin: Skin is warm. No rash noted. No erythema.  Psychiatric: She has a normal mood and affect. Her behavior is normal.    BP 124/68   Pulse 69   Temp 98.4 F (36.9 C) (Oral)   Ht 5\' 4"  (1.626 m)   Wt 183 lb 12.8 oz (83.4 kg)   SpO2 94%   BMI 31.55 kg/m  Wt Readings from Last 3 Encounters:  02/08/16 183 lb 12.8 oz (83.4 kg)  12/06/15 177 lb 12.8 oz (80.6 kg)  10/27/15 172 lb 6.4 oz (78.2 kg)     Lab Results  Component Value Date   WBC 8.2 08/28/2015   HGB 13.6 08/28/2015   HCT 40.2 08/28/2015   PLT 256.0 08/28/2015   GLUCOSE 94 08/28/2015   CHOL 185 09/14/2014   TRIG 123.0 09/14/2014   HDL 62.00 09/14/2014   LDLCALC 98 09/14/2014   ALT 17 08/28/2015   AST 21 08/28/2015   NA 138 08/28/2015   K 3.8 08/28/2015   CL 105 08/28/2015   CREATININE 0.71 08/28/2015   BUN 12 08/28/2015   CO2 26 08/28/2015   TSH 2.51 08/28/2015    Ct Chest Wo Contrast  Result Date: 10/26/2015 CLINICAL DATA:  82 year old female with history of pulmonary nodule. Prior history of  pneumonia. Former smoker (quit 26 years ago). EXAM: CT CHEST WITHOUT CONTRAST TECHNIQUE: Multidetector CT imaging of the chest was performed following the standard protocol without IV contrast. COMPARISON:  Chest CT 02/16/2015. FINDINGS: Mediastinum/Lymph Nodes: Heart size is normal. There is no significant pericardial fluid, thickening or pericardial calcification. There is atherosclerosis of the thoracic aorta, the great vessels of the mediastinum and the coronary arteries, including calcified atherosclerotic plaque in the left main, left anterior descending and right coronary arteries. No pathologically enlarged mediastinal or hilar lymph nodes. Please note that accurate exclusion of hilar adenopathy is limited on noncontrast CT scans. Esophagus is unremarkable in appearance. No  axillary lymphadenopathy. Lungs/Pleura: Mild diffuse bronchial wall thickening. Mild centrilobular and paraseptal emphysema. Bilateral apical pleuroparenchymal thickening, most compatible with chronic post infectious or inflammatory scarring. Scattered throughout the lungs bilaterally there are patchy areas of cylindrical and mild varicose bronchiectasis, most evident in the right middle lobe and inferior segment of the lingula. Associated with these areas there are some areas of architectural distortion and chronic scarring. Additionally, there is extensive peribronchovascular micronodularity, likely to reflect areas of chronic mucoid impaction within terminal bronchioles. The nodular opacity in the medial segment of the right middle lobe seen on the prior study appears to correspond to a dilated bronchus, which is no longer filled with mucus on today's examination. Other than the widespread micronodularity there are no larger more clearly concerning pulmonary nodules or masses noted. No acute consolidative airspace disease. No pleural effusions. Upper Abdomen: Ovoid shaped 2.7 x 4.1 cm low-attenuation lesion in segments 7 and 8 of the  liver near the dome, similar to prior study, previously characterized as a cavernous hemangioma. Atherosclerosis. Exophytic low-attenuation lesion in the upper pole of the left kidney, incompletely characterized, but similar to prior studies, likely a small cyst. Musculoskeletal/Soft Tissues: There are no aggressive appearing lytic or blastic lesions noted in the visualized portions of the skeleton. IMPRESSION: 1. The nodule of concern in the medial segment of the right middle lobe on the prior study appeared to represent some mucoid impaction within a dilated bronchus. There is a spectrum of findings in the lungs bilaterally suggestive of a chronic indolent atypical infectious process such is mycobacterium avium intracellulare (MAI), as detailed above, which likely accounts for the nodule of concern on the prior study as well as the innumerable small micronodules on today's examination. 2. Mild diffuse bronchial wall thickening with mild centrilobular and paraseptal emphysema; imaging findings suggestive of underlying COPD. 3. Atherosclerosis, including left main and 2 vessel coronary artery disease. 4. Additional incidental findings, as above. Electronically Signed   By: Vinnie Langton M.D.   On: 10/26/2015 12:20       Assessment & Plan:   Problem List Items Addressed This Visit    A-fib [facility])    Followed by cardiology.  Stable.       Relevant Orders   Lipid panel   Hepatic function panel   Basic metabolic panel   Anemia    Follow cbc.       B12 deficiency    Continue B12 injections.       Bronchiectasis without acute exacerbation ([place])    Saw pulmonary recently.  Recommended f/u CT in 6 months.  Breathing stable.       Carotid stenosis    F/u carotid ultrasound 01/2013 - no hemodynamically significant stenosis.  Follow.  Continue risk factor modification.       Depression    Doing well on zoloft.        Health care maintenance    Physical today 02/08/16.  Mammogram 07/26/15 -  Birads I.  Had colonoscopy 2011.  Followed by Dr Tiffany Kocher.       Multiple pulmonary nodules    Followed by pulmonary.  Just evaluated by Dr Mortimer Fries.  See CT report.  Recommended f/u CT scan in 6 months.       Osteoporosis    Received reclast.        Relevant Orders   VITAMIN D 25 Hydroxy (Vit-D Deficiency, Fractures)    Other Visit Diagnoses    Routine general medical examination at a health care facility    -  Primary       Einar Pheasant, MD

## 2016-02-08 NOTE — Progress Notes (Signed)
Pre visit review using our clinic review tool, if applicable. No additional management support is needed unless otherwise documented below in the visit note. 

## 2016-02-11 ENCOUNTER — Encounter: Payer: Self-pay | Admitting: Internal Medicine

## 2016-02-11 NOTE — Assessment & Plan Note (Signed)
Doing well on zoloft

## 2016-02-11 NOTE — Assessment & Plan Note (Signed)
Followed by cardiology. Stable.   

## 2016-02-11 NOTE — Assessment & Plan Note (Signed)
Follow cbc.  

## 2016-02-11 NOTE — Assessment & Plan Note (Signed)
Physical today 02/08/16.  Mammogram 07/26/15 - Birads I.  Had colonoscopy 2011.  Followed by Dr Tiffany Kocher.

## 2016-02-11 NOTE — Assessment & Plan Note (Signed)
Followed by pulmonary.  Just evaluated by Dr Mortimer Fries.  See CT report.  Recommended f/u CT scan in 6 months.

## 2016-02-11 NOTE — Assessment & Plan Note (Signed)
Received reclast.   

## 2016-02-11 NOTE — Assessment & Plan Note (Signed)
F/u carotid ultrasound 01/2013 - no hemodynamically significant stenosis.  Follow.  Continue risk factor modification.

## 2016-02-11 NOTE — Assessment & Plan Note (Addendum)
Saw pulmonary recently.  Recommended f/u CT in 6 months.  Breathing stable.

## 2016-02-11 NOTE — Assessment & Plan Note (Signed)
Continue B12 injections.   

## 2016-03-07 ENCOUNTER — Other Ambulatory Visit (INDEPENDENT_AMBULATORY_CARE_PROVIDER_SITE_OTHER): Payer: Medicare Other

## 2016-03-07 DIAGNOSIS — M810 Age-related osteoporosis without current pathological fracture: Secondary | ICD-10-CM | POA: Diagnosis not present

## 2016-03-07 DIAGNOSIS — I480 Paroxysmal atrial fibrillation: Secondary | ICD-10-CM

## 2016-03-07 LAB — LIPID PANEL
Cholesterol: 181 mg/dL (ref 0–200)
HDL: 65 mg/dL (ref >39.00)
LDL Cholesterol: 91 mg/dL (ref 0–99)
NONHDL: 116.04
Total CHOL/HDL Ratio: 3
Triglycerides: 123 mg/dL (ref 0.0–149.0)
VLDL: 24.6 mg/dL (ref 0.0–40.0)

## 2016-03-07 LAB — HEPATIC FUNCTION PANEL
ALK PHOS: 57 U/L (ref 39–117)
ALT: 19 U/L (ref 0–35)
AST: 21 U/L (ref 0–37)
Albumin: 4 g/dL (ref 3.5–5.2)
BILIRUBIN DIRECT: 0.1 mg/dL (ref 0.0–0.3)
BILIRUBIN TOTAL: 0.7 mg/dL (ref 0.2–1.2)
Total Protein: 6.9 g/dL (ref 6.0–8.3)

## 2016-03-07 LAB — BASIC METABOLIC PANEL
BUN: 15 mg/dL (ref 6–23)
CALCIUM: 9.2 mg/dL (ref 8.4–10.5)
CO2: 29 meq/L (ref 19–32)
CREATININE: 0.81 mg/dL (ref 0.40–1.20)
Chloride: 107 mEq/L (ref 96–112)
GFR: 71.75 mL/min (ref >60.00)
GLUCOSE: 91 mg/dL (ref 70–99)
Potassium: 3.9 mEq/L (ref 3.5–5.1)
SODIUM: 142 meq/L (ref 135–145)

## 2016-03-07 LAB — VITAMIN D 25 HYDROXY (VIT D DEFICIENCY, FRACTURES): VITD: 26.28 ng/mL — ABNORMAL LOW (ref 30.00–100.00)

## 2016-04-04 DIAGNOSIS — D2261 Melanocytic nevi of right upper limb, including shoulder: Secondary | ICD-10-CM | POA: Diagnosis not present

## 2016-04-04 DIAGNOSIS — D2272 Melanocytic nevi of left lower limb, including hip: Secondary | ICD-10-CM | POA: Diagnosis not present

## 2016-04-04 DIAGNOSIS — L821 Other seborrheic keratosis: Secondary | ICD-10-CM | POA: Diagnosis not present

## 2016-04-04 DIAGNOSIS — D225 Melanocytic nevi of trunk: Secondary | ICD-10-CM | POA: Diagnosis not present

## 2016-04-25 ENCOUNTER — Ambulatory Visit: Payer: Medicare Other

## 2016-04-25 ENCOUNTER — Ambulatory Visit (INDEPENDENT_AMBULATORY_CARE_PROVIDER_SITE_OTHER): Payer: Medicare Other

## 2016-04-25 VITALS — BP 122/62 | HR 83 | Temp 98.1°F | Resp 14 | Ht 63.0 in | Wt 185.0 lb

## 2016-04-25 DIAGNOSIS — Z0000 Encounter for general adult medical examination without abnormal findings: Secondary | ICD-10-CM | POA: Diagnosis not present

## 2016-04-25 DIAGNOSIS — H40153 Residual stage of open-angle glaucoma, bilateral: Secondary | ICD-10-CM | POA: Diagnosis not present

## 2016-04-25 NOTE — Patient Instructions (Addendum)
  Bridget Briggs , Thank you for taking time to come for your Medicare Wellness Visit. I appreciate your ongoing commitment to your health goals. Please review the following plan we discussed and let me know if I can assist you in the future.   FOLLOW UP WITH DR. Nicki Reaper AS NEEDED.  These are the goals we discussed: Goals    . Reduce portion size          Patient centered goal is to cut meal portion sizes in half. Low carb foods. Lean meats, vegetables. Educational material provided.         This is a list of the screening recommended for you and due dates:  Health Maintenance  Topic Date Due  . Tetanus Vaccine  01/28/1950  . Pneumonia vaccines (2 of 2 - PPSV23) 01/21/2015  . Flu Shot  08/17/2016*  . Mammogram  07/25/2016  . DEXA scan (bone density measurement)  Completed  . Shingles Vaccine  Addressed  *Topic was postponed. The date shown is not the original due date.

## 2016-04-25 NOTE — Progress Notes (Signed)
Subjective:   Bridget Briggs is a 82 y.o. female who presents for Medicare Annual (Subsequent) preventive examination.  Review of Systems:  No ROS.  Medicare Wellness Visit. Cardiac Risk Factors include: advanced age (>21men, >67 women)     Objective:     Vitals: BP 122/62 (BP Location: Right Arm, Patient Position: Sitting, Cuff Size: Normal)   Pulse 83   Temp 98.1 F (36.7 C) (Oral)   Resp 14   Ht 5\' 3"  (1.6 m)   Wt 185 lb (83.9 kg)   SpO2 98%   BMI 32.77 kg/m   Body mass index is 32.77 kg/m.   Tobacco History  Smoking Status  . Former Smoker  . Packs/day: 1.00  . Years: 30.00  . Types: Cigarettes  . Quit date: 05/20/1988  Smokeless Tobacco  . Never Used     Counseling given: Not Answered   Past Medical History:  Diagnosis Date  . Arthritis   . Depression   . GERD (gastroesophageal reflux disease)   . Glaucoma   . History of chicken pox   . History of colon polyps   . Hx: UTI (urinary tract infection)    Past Surgical History:  Procedure Laterality Date  . ABDOMINAL HYSTERECTOMY  1974   left ovary removed  . ANKLE SURGERY Left 1998  . APPENDECTOMY  1974  . BREAST BIOPSY Right 1988   EXCISIONAL - NEG  . BREAST SURGERY Right 1988   biopsy  . CARDIAC CATHETERIZATION N/A 09/06/2015   Procedure: Right/Left Heart Cath and Coronary Angiography;  Surgeon: Yolonda Kida, MD;  Location: [facility];  Service: Cardiovascular;  Laterality: N/A;  . CHOLECYSTECTOMY  1996  . EYE SURGERY Bilateral 2005/2006   cataract  . LAPAROSCOPIC GASTRIC BANDING WITH HIATAL HERNIA REPAIR  2000  . NISSEN FUNDOPLICATION  [id number]  . TONSILLECTOMY AND ADENOIDECTOMY  1940   Family History  Problem Relation Age of Onset  . Arthritis Mother   . Leukemia Mother   . Lung cancer Father   . Osteoporosis Other   . Breast cancer Neg Hx    History  Sexual Activity  . Sexual activity: No    Outpatient Encounter Prescriptions as of 04/25/2016  Medication Sig  .  apixaban (ELIQUIS) 5 MG TABS tablet Take 1 tablet (5 mg total) by mouth 2 (two) times daily.  . beta carotene w/minerals (OCUVITE) tablet Take 1 tablet by mouth daily.  . calcium-vitamin D (OSCAL WITH D) 500-200 MG-UNIT per tablet Take 1 tablet by mouth daily.  . diphenhydramine-acetaminophen (TYLENOL PM) 25-500 MG TABS Take 1 tablet by mouth at bedtime as needed.  . dorzolamide-timolol (COSOPT) 22.3-6.8 MG/ML ophthalmic solution Place 1 drop into both eyes 2 (two) times daily.  . fluocinolone (SYNALAR) 0.01 % external solution Apply topically 2 (two) times daily as needed.  . latanoprost (XALATAN) 0.005 % ophthalmic solution Place 1 drop into both eyes at bedtime.  . vitamin D, CHOLECALCIFEROL, 400 UNITS tablet Take 400 Units by mouth daily.  . [DISCONTINUED] sertraline (ZOLOFT) 50 MG tablet Take 1 tablet (50 mg total) by mouth daily.   No facility-administered encounter medications on file as of 04/25/2016.     Activities of Daily Living In your present state of health, do you have any difficulty performing the following activities: 04/25/2016 06/08/2015  Hearing? N N  Vision? N N  Difficulty concentrating or making decisions? N N  Walking or climbing stairs? N N  Dressing or bathing? N N  Doing  errands, shopping? N N  Preparing Food and eating ? N -  Using the Toilet? N -  In the past six months, have you accidently leaked urine? N -  Do you have problems with loss of bowel control? N -  Managing your Medications? N -  Managing your Finances? N -  Housekeeping or managing your Housekeeping? N -  Some recent data might be hidden    Patient Care Team: Einar Pheasant, MD as PCP - General (Internal Medicine)    Assessment:    This is a routine wellness examination for Bridget Briggs. The goal of the wellness visit is to assist the patient how to close the gaps in care and create a preventative care plan for the patient.   Taking calcium VIT D as appropriate/Osteoporosis  reviewed.  Medications reviewed; taking without issues or barriers.  Safety issues reviewed; lives alone.  Smoke detectors in the home. No firearms in the home. Wears seatbelts when driving or riding with others. No violence in the home.  No identified risk were noted; The patient was oriented x 3; appropriate in dress and manner and no objective failures at ADL's or IADL's.   BMI; discussed the importance of a healthy diet, water intake and exercise. Educational material provided.  PNA 23 vaccine deferred per patient request.  Educational material provided.  Patient Concerns: None at this time. Follow up with PCP as needed.  Exercise Activities and Dietary recommendations Current Exercise Habits: Home exercise routine;Structured exercise class (Goes to [facility]), Type of exercise: walking;strength training/weights;calisthenics;stretching, Time (Minutes): 30, Frequency (Times/Week): 3, Weekly Exercise (Minutes/Week): 90, Intensity: Moderate  Goals    . Reduce portion size          Patient centered goal is to cut meal portion sizes in half. Low carb foods. Lean meats, vegetables. Educational material provided.        Fall Risk Fall Risk  04/25/2016 02/08/2016 07/12/2015 04/26/2015 02/15/2015  Falls in the past year? No No No No No   Depression Screen PHQ 2/9 Scores 04/25/2016 02/08/2016 07/12/2015 04/26/2015  PHQ - 2 Score 0 0 1 0     Cognitive Function MMSE - Mini Mental State Exam 04/26/2015  Orientation to time 5  Orientation to Place 5  Registration 3  Attention/ Calculation 5  Recall 3  Language- name 2 objects 2  Language- repeat 1  Language- follow 3 step command 3  Language- read & follow direction 1  Write a sentence 1  Copy design 1  Total score 30     6CIT Screen 04/25/2016  What Year? 0 points  What month? 0 points  What time? 0 points  Count back from 20 0 points  Months in reverse 0 points    Immunization History  Administered Date(s) Administered  .  Influenza Split 02/22/2013  . Influenza,inj,Quad PF,36+ Mos 01/20/2014, 03/02/2015  . Pneumococcal Conjugate-13 01/20/2014   Screening Tests Health Maintenance  Topic Date Due  . TETANUS/TDAP  01/28/1950  . PNA vac Low Risk Adult (2 of 2 - PPSV23) 01/21/2015  . INFLUENZA VACCINE  08/17/2016 (Originally 12/19/2015)  . MAMMOGRAM  07/25/2016  . DEXA SCAN  Completed  . ZOSTAVAX  Addressed      Plan:    End of life planning; Advance aging; Advanced directives discussed. Copy of current HCPOA/Living Will requested.  Medicare Attestation I have personally reviewed: The patient's medical and social history Their use of alcohol, tobacco or illicit drugs Their current medications and supplements The patient's functional ability  including ADLs,fall risks, home safety risks, cognitive, and hearing and visual impairment Diet and physical activities Evidence for depression   The patient's weight, height, BMI, and visual acuity have been recorded in the chart.  I have made referrals and provided education to the patient based on review of the above and I have provided the patient with a written personalized care plan for preventive services.    During the course of the visit the patient was educated and counseled about the following appropriate screening and preventive services:   Vaccines to include Pneumoccal, Influenza, Hepatitis B, Td, Zostavax, HCV  Electrocardiogram  Cardiovascular Disease  Colorectal cancer screening  Bone density screening  Diabetes screening  Glaucoma screening  Mammography/PAP  Nutrition counseling   Patient Instructions (the written plan) was given to the patient.   Varney Biles, LPN  [phone]   Reviewed above information.  Agree with plan.  Dr Nicki Reaper

## 2016-05-17 ENCOUNTER — Telehealth: Payer: Self-pay | Admitting: Internal Medicine

## 2016-05-17 DIAGNOSIS — J209 Acute bronchitis, unspecified: Secondary | ICD-10-CM | POA: Diagnosis not present

## 2016-05-17 NOTE — Telephone Encounter (Signed)
If cough, productive, etc - then agree with evaluation.

## 2016-05-17 NOTE — Telephone Encounter (Signed)
Reason for call: cold like symptoms Symptoms: coughing up  green phlegm, night sweats, denies shortness of breath Duration since December 25 Medications: tried Mucinex once Last seen for this problem:N/A Seen by:N/A Encouraged to go [place] urgent be evaluated , or [facility].   Patient would like you advice.   Advised no appointment available at our office.

## 2016-05-17 NOTE — Telephone Encounter (Signed)
Pt called and c/o cold like symptoms coughing and green phlegm. Pt would like something called in. Please advise, thank you!  [place], [place]  Call pt @ [phone]

## 2016-05-17 NOTE — Telephone Encounter (Signed)
Left message to call.

## 2016-05-17 NOTE — Telephone Encounter (Signed)
Patient advised and verbalized an understanding  

## 2016-05-29 ENCOUNTER — Ambulatory Visit
Admission: RE | Admit: 2016-05-29 | Discharge: 2016-05-29 | Disposition: A | Discharge status: Home / Self Care | Payer: Medicare Other | Source: Ambulatory Visit | Attending: Internal Medicine | Admitting: Internal Medicine

## 2016-05-29 DIAGNOSIS — J439 Emphysema, unspecified: Secondary | ICD-10-CM | POA: Diagnosis not present

## 2016-05-29 DIAGNOSIS — J479 Bronchiectasis, uncomplicated: Secondary | ICD-10-CM | POA: Diagnosis not present

## 2016-05-29 DIAGNOSIS — I700 Atherosclerosis of aorta: Secondary | ICD-10-CM | POA: Insufficient documentation

## 2016-05-29 DIAGNOSIS — R918 Other nonspecific abnormal finding of lung field: Secondary | ICD-10-CM

## 2016-05-30 ENCOUNTER — Encounter: Payer: Self-pay | Admitting: Internal Medicine

## 2016-05-30 ENCOUNTER — Ambulatory Visit (INDEPENDENT_AMBULATORY_CARE_PROVIDER_SITE_OTHER): Payer: Medicare Other | Admitting: Internal Medicine

## 2016-05-30 VITALS — BP 138/88 | HR 66 | Wt 184.0 lb

## 2016-05-30 DIAGNOSIS — J209 Acute bronchitis, unspecified: Secondary | ICD-10-CM | POA: Diagnosis not present

## 2016-05-30 MED ORDER — GUAIFENESIN-CODEINE 100-10 MG/5ML PO SOLN: 5.0000 mL | ORAL | 0 refills | Status: DC | PRN

## 2016-05-30 NOTE — Patient Instructions (Signed)
Cough syrup-use as directed

## 2016-05-30 NOTE — Progress Notes (Signed)
Subjective:    Patient ID: Bridget Briggs, female    DOB: [birth year], 83 y.o.   MRN: [id number]  Synopsis: Referred in 2015 for evaluation of a pulmonary nodule. Found to have lingular and right middle lobe bronchiectasis which she attributes to her prior episode of pneumonia.  Repeat CT chest doing in February 2016 did not show changes in the small nodules which could represent indolent mycobacterial disease. CT chest 02/13/15 shows new GGO RML-likely related to recent pneumonia, RLL nodule CT chest  11/05/15 shows RML noldules appeared to represent some mucoid impaction within a dilated bronchus  HPI Follow up acute bronchitis 2 weeks ago  No chest pain, no sign of infection at this time CT chest reviewed with patient-no major changes,  Denies SOB/DOE, no wheezing, exercises 3 times per week  CT chest as reviewed below with patient 1. The nodule of concern in the medial segment of the right middle lobe on the prior study appeared to represent some mucoid impaction within a dilated bronchus.  2. Mild diffuse bronchial wall thickening with mild centrilobular and paraseptal emphysema; imaging findings suggestive of underlying COPD.   CT chest 05/2016 Biapical pleural-parenchymal scarring again noted centrilobular emphysema associated with diffuse bronchial wall thickening. As mentioned previously, scattered areas of bronchiectasis are evident with stable right middle lobe and lingular scarring. Peribronchovascular nodularity in the posterior right upper lobe and peripheral right lower lobe is stable, compatible with scarring and likely related to prior atypical infection. No focal airspace consolidation. No suspicious pulmonary nodule or mass. No pulmonary edema or pleural effusion.    Review of Systems  Constitutional: Negative for fever and unexpected weight change.  HENT: Negative for congestion, dental problem, ear pain, nosebleeds, postnasal drip, rhinorrhea, sinus  pressure, sneezing, sore throat and trouble swallowing.   Eyes: Negative for redness and itching.  Respiratory: Negative for cough, chest tightness, shortness of breath and wheezing.   Cardiovascular: Negative for palpitations and leg swelling.       Objective:   Physical Exam   BP 138/88 (BP Location: Left Arm, Cuff Size: Normal)   Pulse 66   Wt 184 lb (83.5 kg)   SpO2 98%   BMI 32.59 kg/m    Gen: well appearing, no acute distress HEENT: NCAT, EOMi, OP clear, neck supple without masses PULM: CTA B CV: RRR, slight systolic murmur, no JVD AB: BS+, soft, nontender Ext: warm, no edema, no clubbing, no cyanosis Derm: no rash or skin breakdown Neuro: A&Ox4, MAEW   11/15/2013 CT chest> mild centrilobular emphysema bilaterally, there is mild lingular bronchiectasis as well as right middle lobe bronchiectasis., There is also subpleural nodularity in the right lower lobe which is worrisome for Mycobacterium avium complex infection. February 2016 CT chest images reviewed today in clinic> no significant change in the right middle lobe nodules, and fact they're slightly smaller, right middle lobe and lingular bronchiectasis again noted. Mild centrilobular emphysema.  11/05/2015 The nodule of concern in the medial segment of the right middle lobe on the prior study appeared to represent some mucoid impaction within a dilated bronchus.  2. Mild diffuse bronchial wall thickening with mild centrilobular and paraseptal emphysema; imaging findings suggestive of underlying COPD.  05/29/2016 Biapical pleural-parenchymal scarring again noted centrilobular emphysema associated with diffuse bronchial wall thickening. As mentioned previously, scattered areas of bronchiectasis are evident with stable right middle lobe and lingular scarring. Peribronchovascular nodularity in the posterior right upper lobe and peripheral right lower lobe is stable, compatible with scarring and  likely related to  prior atypical infection. No focal airspace consolidation. No suspicious pulmonary nodule or mass. No pulmonary edema or pleural effusion.  Assessment & Plan:  83 yo white female with h/o COPD Gold Stage A with Bronchiectasis , no signs of pneumonia at this time    Bronchiectasis with acute COPD exacerbation-resolved -stable no acute resp symptoms at this time -has residual cough -start robitussin with codeine as needed   Multiple pulmonary nodules-re-assurance provided to patient -will need Repeat CT chest to assess nodules in 1 year   Follow up in 6 months   The Patient requires high complexity decision making for assessment and support, frequent evaluation and titration of therapies. Patient satisfied with Plan of action and management. All questions answered  Corrin Parker, M.D.  [facility]  Medical Director [place] Director [facility] Cardio-Pulmonary Department

## 2016-06-10 ENCOUNTER — Ambulatory Visit (INDEPENDENT_AMBULATORY_CARE_PROVIDER_SITE_OTHER): Payer: Medicare Other | Admitting: Internal Medicine

## 2016-06-10 ENCOUNTER — Encounter: Payer: Self-pay | Admitting: Internal Medicine

## 2016-06-10 DIAGNOSIS — R918 Other nonspecific abnormal finding of lung field: Secondary | ICD-10-CM | POA: Diagnosis not present

## 2016-06-10 DIAGNOSIS — J479 Bronchiectasis, uncomplicated: Secondary | ICD-10-CM

## 2016-06-10 DIAGNOSIS — F329 Major depressive disorder, single episode, unspecified: Secondary | ICD-10-CM | POA: Diagnosis not present

## 2016-06-10 DIAGNOSIS — F32A Depression, unspecified: Secondary | ICD-10-CM

## 2016-06-10 DIAGNOSIS — I480 Paroxysmal atrial fibrillation: Secondary | ICD-10-CM

## 2016-06-10 DIAGNOSIS — I6529 Occlusion and stenosis of unspecified carotid artery: Secondary | ICD-10-CM

## 2016-06-10 DIAGNOSIS — Z86010 Personal history of colonic polyps: Secondary | ICD-10-CM

## 2016-06-10 DIAGNOSIS — Z23 Encounter for immunization: Secondary | ICD-10-CM

## 2016-06-10 DIAGNOSIS — D649 Anemia, unspecified: Secondary | ICD-10-CM

## 2016-06-10 NOTE — Progress Notes (Signed)
Patient ID: Bridget Briggs, female   DOB: 1931-04-17, 83 y.o.   MRN: [id number]   Subjective:    Patient ID: Bridget Briggs, female    DOB: 1930/06/27, 83 y.o.   MRN: [id number]  HPI  Patient here for a scheduled follow up.  States she is doing well.  Seeing Dr Mortimer Fries.  Has bronchiectasis.  Just evaluated.  Felt stable.  Recommended f/u in 6 months.  Breathing stable.  No chest pain.  No acid reflux.  No abdominal pain or cramping.  Bowels stable.     Past Medical History:  Diagnosis Date  . Arthritis   . Depression   . GERD (gastroesophageal reflux disease)   . Glaucoma   . History of chicken pox   . History of colon polyps   . Hx: UTI (urinary tract infection)    Past Surgical History:  Procedure Laterality Date  . ABDOMINAL HYSTERECTOMY  1974   left ovary removed  . ANKLE SURGERY Left 1998  . APPENDECTOMY  1974  . BREAST BIOPSY Right 1988   EXCISIONAL - NEG  . BREAST SURGERY Right 1988   biopsy  . CARDIAC CATHETERIZATION N/A 09/06/2015   Procedure: Right/Left Heart Cath and Coronary Angiography;  Surgeon: Yolonda Kida, MD;  Location: [place] [facility];  Service: Cardiovascular;  Laterality: N/A;  . CHOLECYSTECTOMY  1996  . EYE SURGERY Bilateral 2005/2006   cataract  . LAPAROSCOPIC GASTRIC BANDING WITH HIATAL HERNIA REPAIR  2000  . NISSEN FUNDOPLICATION  [id number]  . TONSILLECTOMY AND ADENOIDECTOMY  1940   Family History  Problem Relation Age of Onset  . Arthritis Mother   . Leukemia Mother   . Lung cancer Father   . Osteoporosis Other   . Breast cancer Neg Hx    Social History   Social History  . Marital status: Married    Spouse name: N/A  . Number of children: N/A  . Years of education: N/A   Social History Main Topics  . Smoking status: Former Smoker    Packs/day: 1.00    Years: 30.00    Types: Cigarettes    Quit date: 05/20/1988  . Smokeless tobacco: Never Used  . Alcohol use No  . Drug use: No  . Sexual activity: No   Other Topics Concern  .  None   Social History Narrative  . None    Outpatient Encounter Prescriptions as of 06/10/2016  Medication Sig  . apixaban (ELIQUIS) 5 MG TABS tablet Take 1 tablet (5 mg total) by mouth 2 (two) times daily.  . beta carotene w/minerals (OCUVITE) tablet Take 1 tablet by mouth daily.  . calcium-vitamin D (OSCAL WITH D) 500-200 MG-UNIT per tablet Take 1 tablet by mouth daily.  . diphenhydramine-acetaminophen (TYLENOL PM) 25-500 MG TABS Take 1 tablet by mouth at bedtime as needed.  . dorzolamide-timolol (COSOPT) 22.3-6.8 MG/ML ophthalmic solution Place 1 drop into both eyes 2 (two) times daily.  . fluocinolone (SYNALAR) 0.01 % external solution Apply topically 2 (two) times daily as needed.  Marland Kitchen guaiFENesin-codeine 100-10 MG/5ML syrup Take 5 mLs by mouth every 4 (four) hours as needed for cough.  . latanoprost (XALATAN) 0.005 % ophthalmic solution Place 1 drop into both eyes at bedtime.  . vitamin D, CHOLECALCIFEROL, 400 UNITS tablet Take 400 Units by mouth daily.   No facility-administered encounter medications on file as of 06/10/2016.     Review of Systems  Constitutional: Negative for appetite change and unexpected weight change.  HENT: Negative  for congestion and sinus pressure.   Respiratory: Negative for chest tightness.        Breathing stable.   Cardiovascular: Negative for chest pain, palpitations and leg swelling.  Gastrointestinal: Negative for abdominal pain, diarrhea, nausea and vomiting.  Genitourinary: Negative for difficulty urinating and dysuria.  Musculoskeletal: Negative for joint swelling and myalgias.  Skin: Negative for color change and rash.  Neurological: Negative for dizziness, light-headedness and headaches.  Psychiatric/Behavioral: Negative for agitation and dysphoric mood.       Objective:     Blood pressure rechecked by me:  138/72  Physical Exam  Constitutional: She appears well-developed and well-nourished. No distress.  HENT:  Nose: Nose normal.    Mouth/Throat: Oropharynx is clear and moist.  Neck: Neck supple. No thyromegaly present.  Cardiovascular: Normal rate and regular rhythm.   Pulmonary/Chest: Breath sounds normal. No respiratory distress. She has no wheezes.  Abdominal: Soft. Bowel sounds are normal. There is no tenderness.  Musculoskeletal: She exhibits no edema or tenderness.  Lymphadenopathy:    She has no cervical adenopathy.  Skin: No rash noted. No erythema.  Psychiatric: She has a normal mood and affect. Her behavior is normal.    BP 138/70 (BP Location: Left Arm, Patient Position: Sitting, Cuff Size: Normal)   Pulse 72   Temp 98.2 F (36.8 C) (Oral)   Resp 18   Wt 185 lb 8 oz (84.1 kg)   SpO2 96%   BMI 32.86 kg/m  Wt Readings from Last 3 Encounters:  06/10/16 185 lb 8 oz (84.1 kg)  05/30/16 184 lb (83.5 kg)  04/25/16 185 lb (83.9 kg)     Lab Results  Component Value Date   WBC 8.2 08/28/2015   HGB 13.6 08/28/2015   HCT 40.2 08/28/2015   PLT 256.0 08/28/2015   GLUCOSE 91 03/07/2016   CHOL 181 03/07/2016   TRIG 123.0 03/07/2016   HDL 65.00 03/07/2016   LDLCALC 91 03/07/2016   ALT 19 03/07/2016   AST 21 03/07/2016   NA 142 03/07/2016   K 3.9 03/07/2016   CL 107 03/07/2016   CREATININE 0.81 03/07/2016   BUN 15 03/07/2016   CO2 29 03/07/2016   TSH 2.51 08/28/2015    Ct Chest Wo Contrast  Result Date: 05/29/2016 CLINICAL DATA:  Multiple bilateral pulmonary nodules. EXAM: CT CHEST WITHOUT CONTRAST TECHNIQUE: Multidetector CT imaging of the chest was performed following the standard protocol without IV contrast. COMPARISON:  10/26/2015 FINDINGS: Cardiovascular: Heart size upper normal. Coronary artery calcification is noted. Atherosclerotic calcification is noted in the wall of the thoracic aorta. Mediastinum/Nodes: No mediastinal lymphadenopathy. No evidence for gross hilar lymphadenopathy although assessment is limited by the lack of intravenous contrast on today's study. The esophagus has  normal imaging features. There is no axillary lymphadenopathy. Lungs/Pleura: Biapical pleural-parenchymal scarring again noted centrilobular emphysema associated with diffuse bronchial wall thickening. As mentioned previously, scattered areas of bronchiectasis are evident with stable right middle lobe and lingular scarring. Peribronchovascular nodularity in the posterior right upper lobe and peripheral right lower lobe is stable, compatible with scarring and likely related to prior atypical infection. No focal airspace consolidation. No suspicious pulmonary nodule or mass. No pulmonary edema or pleural effusion. Upper Abdomen: Stable 4 cm hypoattenuating lesion in the dome of the liver. Other scattered tiny hypoattenuating liver lesions also appear stable. Exophytic 10 mm lesion upper pole left kidney unchanged in the interval. Musculoskeletal: Bone windows reveal no worrisome lytic or sclerotic osseous lesions. IMPRESSION: 1. Stable interval exam  with emphysema, scattered areas of bronchiectasis and parenchymal scarring. Sequelae of atypical infection again noted in these areas likely reflect scarring. 2. Coronary artery and thoracoabdominal aortic atherosclerosis. Electronically Signed   By: Misty Stanley M.D.   On: 05/29/2016 13:55       Assessment & Plan:   Problem List Items Addressed This Visit    A-fib [facility])    Followed by cardiology.  Stable.        Anemia    Follow cbc.        Relevant Orders   CBC with Differential/Platelet   Bronchiectasis without acute exacerbation ([place])    Seeing pulmonary.  Just evaluated. Recommended f/u in 6 months.  Stable.        Relevant Orders   Hepatic function panel   Basic metabolic panel   Carotid stenosis    Continue risk factor modification.        Depression    Doing well.  Follow.        Relevant Orders   TSH   History of colonic polyps    Colonoscopy 2011.  Followed by Dr Tiffany Kocher.       Multiple pulmonary nodules    Followed by  pulmonary.  Just evaluated.  Recommended f/u in 6 months.  Will follow CT.         Other Visit Diagnoses    Encounter for immunization       Relevant Orders   Flu vaccine HIGH DOSE PF (Completed)       Einar Pheasant, MD

## 2016-06-10 NOTE — Progress Notes (Signed)
Pre visit review using our clinic review tool, if applicable. No additional management support is needed unless otherwise documented below in the visit note. 

## 2016-06-16 ENCOUNTER — Encounter: Payer: Self-pay | Admitting: Internal Medicine

## 2016-06-16 NOTE — Assessment & Plan Note (Signed)
Colonoscopy 2011.  Followed by Dr Tiffany Kocher.

## 2016-06-16 NOTE — Assessment & Plan Note (Signed)
Followed by cardiology. Stable.   

## 2016-06-16 NOTE — Assessment & Plan Note (Signed)
Followed by pulmonary.  Just evaluated.  Recommended f/u in 6 months.  Will follow CT.

## 2016-06-16 NOTE — Assessment & Plan Note (Signed)
Follow cbc.  

## 2016-06-16 NOTE — Assessment & Plan Note (Signed)
Doing well.  Follow.  

## 2016-06-16 NOTE — Assessment & Plan Note (Signed)
Seeing pulmonary.  Just evaluated. Recommended f/u in 6 months.  Stable.

## 2016-06-16 NOTE — Assessment & Plan Note (Signed)
Continue risk factor modification 

## 2016-08-28 DIAGNOSIS — H40153 Residual stage of open-angle glaucoma, bilateral: Secondary | ICD-10-CM | POA: Diagnosis not present

## 2016-09-09 ENCOUNTER — Other Ambulatory Visit: Payer: Medicare Other

## 2016-09-11 ENCOUNTER — Encounter: Payer: Self-pay | Admitting: *Deleted

## 2016-09-11 ENCOUNTER — Other Ambulatory Visit (INDEPENDENT_AMBULATORY_CARE_PROVIDER_SITE_OTHER): Payer: Medicare Other

## 2016-09-11 DIAGNOSIS — F32A Depression, unspecified: Secondary | ICD-10-CM

## 2016-09-11 DIAGNOSIS — F329 Major depressive disorder, single episode, unspecified: Secondary | ICD-10-CM

## 2016-09-11 DIAGNOSIS — J479 Bronchiectasis, uncomplicated: Secondary | ICD-10-CM

## 2016-09-11 DIAGNOSIS — D649 Anemia, unspecified: Secondary | ICD-10-CM | POA: Diagnosis not present

## 2016-09-11 LAB — CBC WITH DIFFERENTIAL/PLATELET
BASOS ABS: 0.1 10*3/uL (ref 0.0–0.1)
BASOS PCT: 0.8 % (ref 0.0–3.0)
Eosinophils Absolute: 0.1 10*3/uL (ref 0.0–0.7)
Eosinophils Relative: 1.9 % (ref 0.0–5.0)
HEMATOCRIT: 43.4 % (ref 36.0–46.0)
Hemoglobin: 14.2 g/dL (ref 12.0–15.0)
LYMPHS PCT: 30 % (ref 12.0–46.0)
Lymphs Abs: 2.3 10*3/uL (ref 0.7–4.0)
MCHC: 32.7 g/dL (ref 30.0–36.0)
MCV: 87.7 fl (ref 78.0–100.0)
Monocytes Absolute: 0.6 10*3/uL (ref 0.1–1.0)
Monocytes Relative: 7.4 % (ref 3.0–12.0)
NEUTROS ABS: 4.7 10*3/uL (ref 1.4–7.7)
NEUTROS PCT: 59.9 % (ref 43.0–77.0)
PLATELETS: 235 10*3/uL (ref 150.0–400.0)
RBC: 4.95 Mil/uL (ref 3.87–5.11)
RDW: 13.9 % (ref 11.5–15.5)
WBC: 7.8 10*3/uL (ref 4.0–10.5)

## 2016-09-11 LAB — BASIC METABOLIC PANEL
BUN: 13 mg/dL (ref 6–23)
CHLORIDE: 102 meq/L (ref 96–112)
CO2: 27 mEq/L (ref 19–32)
Calcium: 9.7 mg/dL (ref 8.4–10.5)
Creatinine, Ser: 0.84 mg/dL (ref 0.40–1.20)
GFR: 68.72 mL/min (ref >60.00)
GLUCOSE: 97 mg/dL (ref 70–99)
POTASSIUM: 4.5 meq/L (ref 3.5–5.1)
Sodium: 135 mEq/L (ref 135–145)

## 2016-09-11 LAB — HEPATIC FUNCTION PANEL
ALK PHOS: 50 U/L (ref 39–117)
ALT: 15 U/L (ref 0–35)
AST: 19 U/L (ref 0–37)
Albumin: 4.1 g/dL (ref 3.5–5.2)
BILIRUBIN DIRECT: 0.1 mg/dL (ref 0.0–0.3)
TOTAL PROTEIN: 6.9 g/dL (ref 6.0–8.3)
Total Bilirubin: 0.7 mg/dL (ref 0.2–1.2)

## 2016-09-11 LAB — TSH: TSH: 3.73 u[IU]/mL (ref 0.35–4.50)

## 2016-09-12 ENCOUNTER — Encounter: Payer: Self-pay | Admitting: Internal Medicine

## 2016-09-12 ENCOUNTER — Ambulatory Visit (INDEPENDENT_AMBULATORY_CARE_PROVIDER_SITE_OTHER): Payer: Medicare Other | Admitting: Internal Medicine

## 2016-09-12 ENCOUNTER — Telehealth: Payer: Self-pay | Admitting: *Deleted

## 2016-09-12 DIAGNOSIS — J479 Bronchiectasis, uncomplicated: Secondary | ICD-10-CM | POA: Diagnosis not present

## 2016-09-12 DIAGNOSIS — Z23 Encounter for immunization: Secondary | ICD-10-CM | POA: Diagnosis not present

## 2016-09-12 DIAGNOSIS — I480 Paroxysmal atrial fibrillation: Secondary | ICD-10-CM | POA: Diagnosis not present

## 2016-09-12 DIAGNOSIS — Z1231 Encounter for screening mammogram for malignant neoplasm of breast: Secondary | ICD-10-CM | POA: Diagnosis not present

## 2016-09-12 DIAGNOSIS — E2839 Other primary ovarian failure: Secondary | ICD-10-CM

## 2016-09-12 DIAGNOSIS — Z1239 Encounter for other screening for malignant neoplasm of breast: Secondary | ICD-10-CM

## 2016-09-12 DIAGNOSIS — R079 Chest pain, unspecified: Secondary | ICD-10-CM | POA: Diagnosis not present

## 2016-09-12 DIAGNOSIS — K219 Gastro-esophageal reflux disease without esophagitis: Secondary | ICD-10-CM

## 2016-09-12 NOTE — Patient Instructions (Signed)
Zantac (ranitidine) 150mg - take one tablet 30 minutes before breakfast.   

## 2016-09-12 NOTE — Telephone Encounter (Signed)
Please give a time and date to place patient for a follow up in 8 weeks  Pt contact [phone]

## 2016-09-12 NOTE — Progress Notes (Signed)
Patient ID: Bridget Briggs, female   DOB: 1931-02-28, 83 y.o.   MRN: [id number]   Subjective:    Patient ID: Bridget Briggs, female    DOB: 06-Nov-1930, 83 y.o.   MRN: [id number]  HPI  Patient here for a scheduled follow up.  She saw Dr Mortimer Fries 05/2016.  Felt stable.  Recommended f/u CT chest in one year.  Saw Dr Clayborn Bigness last 09/13/15.  Note reviewed.  Had cardiac cath that was reasonably normal.felt stable and recommended f/u in one year.  She reports that she was playing cards recently.  Felt some chest heaviness.  After she burped was better.  May have lasted five minutes.  Has had two episodes like this.  No pain or tightness now.  Some increased gas.  No nausea or vomiting.  Bowels moving.       Past Medical History:  Diagnosis Date  . Arthritis   . Depression   . GERD (gastroesophageal reflux disease)   . Glaucoma   . History of chicken pox   . History of colon polyps   . Hx: UTI (urinary tract infection)    Past Surgical History:  Procedure Laterality Date  . ABDOMINAL HYSTERECTOMY  1974   left ovary removed  . ANKLE SURGERY Left 1998  . APPENDECTOMY  1974  . BREAST BIOPSY Right 1988   EXCISIONAL - NEG  . BREAST SURGERY Right 1988   biopsy  . CARDIAC CATHETERIZATION N/A 09/06/2015   Procedure: Right/Left Heart Cath and Coronary Angiography;  Surgeon: Yolonda Kida, MD;  Location: [place] [facility];  Service: Cardiovascular;  Laterality: N/A;  . CHOLECYSTECTOMY  1996  . EYE SURGERY Bilateral 2005/2006   cataract  . LAPAROSCOPIC GASTRIC BANDING WITH HIATAL HERNIA REPAIR  2000  . NISSEN FUNDOPLICATION  1962  . TONSILLECTOMY AND ADENOIDECTOMY  1940   Family History  Problem Relation Age of Onset  . Arthritis Mother   . Leukemia Mother   . Lung cancer Father   . Osteoporosis Other   . Breast cancer Neg Hx    Social History   Social History  . Marital status: Married    Spouse name: N/A  . Number of children: N/A  . Years of education: N/A   Social History  Main Topics  . Smoking status: Former Smoker    Packs/day: 1.00    Years: 30.00    Types: Cigarettes    Quit date: 05/20/1988  . Smokeless tobacco: Never Used  . Alcohol use No  . Drug use: No  . Sexual activity: No   Other Topics Concern  . None   Social History Narrative  . None    Outpatient Encounter Prescriptions as of 09/12/2016  Medication Sig  . apixaban (ELIQUIS) 5 MG TABS tablet Take 1 tablet (5 mg total) by mouth 2 (two) times daily.  . beta carotene w/minerals (OCUVITE) tablet Take 1 tablet by mouth daily.  . calcium-vitamin D (OSCAL WITH D) 500-200 MG-UNIT per tablet Take 1 tablet by mouth daily.  . diphenhydramine-acetaminophen (TYLENOL PM) 25-500 MG TABS Take 1 tablet by mouth at bedtime as needed.  . dorzolamide-timolol (COSOPT) 22.3-6.8 MG/ML ophthalmic solution Place 1 drop into both eyes 2 (two) times daily.  . fluocinolone (SYNALAR) 0.01 % external solution Apply topically 2 (two) times daily as needed.  . latanoprost (XALATAN) 0.005 % ophthalmic solution Place 1 drop into both eyes at bedtime.  . vitamin D, CHOLECALCIFEROL, 400 UNITS tablet Take 400 Units by mouth daily.  . [  DISCONTINUED] guaiFENesin-codeine 100-10 MG/5ML syrup Take 5 mLs by mouth every 4 (four) hours as needed for cough.   No facility-administered encounter medications on file as of 09/12/2016.     Review of Systems  Constitutional: Negative for appetite change and unexpected weight change.  HENT: Negative for congestion and sinus pressure.   Respiratory: Positive for chest tightness. Negative for cough and shortness of breath.   Cardiovascular: Positive for chest pain. Negative for palpitations and leg swelling.  Gastrointestinal: Negative for abdominal pain, diarrhea, nausea and vomiting.  Genitourinary: Negative for difficulty urinating and dysuria.  Musculoskeletal: Negative for joint swelling and myalgias.  Skin: Negative for color change and rash.  Neurological: Negative for  dizziness, light-headedness and headaches.  Psychiatric/Behavioral: Negative for agitation and dysphoric mood.       Objective:    Physical Exam  Constitutional: She appears well-developed and well-nourished.  HENT:  Nose: Nose normal.  Mouth/Throat: Oropharynx is clear and moist.  Neck: Neck supple. No thyromegaly present.  Cardiovascular: Normal rate and regular rhythm.   Pulmonary/Chest: Breath sounds normal. No respiratory distress. She has no wheezes.  Abdominal: Soft. Bowel sounds are normal. There is no tenderness.  Musculoskeletal: She exhibits no edema or tenderness.  Lymphadenopathy:    She has no cervical adenopathy.  Skin: No rash noted. No erythema.  Psychiatric: She has a normal mood and affect. Her behavior is normal.    BP 140/68 (BP Location: Left Arm, Patient Position: Sitting, Cuff Size: Normal)   Pulse 78   Temp 97.8 F (36.6 C) (Oral)   Resp 12   Ht 5\' 3"  (1.6 m)   Wt 189 lb (85.7 kg)   SpO2 97%   BMI 33.48 kg/m  Wt Readings from Last 3 Encounters:  09/12/16 189 lb (85.7 kg)  06/10/16 185 lb 8 oz (84.1 kg)  05/30/16 184 lb (83.5 kg)     Lab Results  Component Value Date   WBC 7.8 09/11/2016   HGB 14.2 09/11/2016   HCT 43.4 09/11/2016   PLT 235.0 09/11/2016   GLUCOSE 97 09/11/2016   CHOL 181 03/07/2016   TRIG 123.0 03/07/2016   HDL 65.00 03/07/2016   LDLCALC 91 03/07/2016   ALT 15 09/11/2016   AST 19 09/11/2016   NA 135 09/11/2016   K 4.5 09/11/2016   CL 102 09/11/2016   CREATININE 0.84 09/11/2016   BUN 13 09/11/2016   CO2 27 09/11/2016   TSH 3.73 09/11/2016    Ct Chest Wo Contrast  Result Date: 05/29/2016 CLINICAL DATA:  Multiple bilateral pulmonary nodules. EXAM: CT CHEST WITHOUT CONTRAST TECHNIQUE: Multidetector CT imaging of the chest was performed following the standard protocol without IV contrast. COMPARISON:  10/26/2015 FINDINGS: Cardiovascular: Heart size upper normal. Coronary artery calcification is noted. Atherosclerotic  calcification is noted in the wall of the thoracic aorta. Mediastinum/Nodes: No mediastinal lymphadenopathy. No evidence for gross hilar lymphadenopathy although assessment is limited by the lack of intravenous contrast on today's study. The esophagus has normal imaging features. There is no axillary lymphadenopathy. Lungs/Pleura: Biapical pleural-parenchymal scarring again noted centrilobular emphysema associated with diffuse bronchial wall thickening. As mentioned previously, scattered areas of bronchiectasis are evident with stable right middle lobe and lingular scarring. Peribronchovascular nodularity in the posterior right upper lobe and peripheral right lower lobe is stable, compatible with scarring and likely related to prior atypical infection. No focal airspace consolidation. No suspicious pulmonary nodule or mass. No pulmonary edema or pleural effusion. Upper Abdomen: Stable 4 cm hypoattenuating lesion in  the dome of the liver. Other scattered tiny hypoattenuating liver lesions also appear stable. Exophytic 10 mm lesion upper pole left kidney unchanged in the interval. Musculoskeletal: Bone windows reveal no worrisome lytic or sclerotic osseous lesions. IMPRESSION: 1. Stable interval exam with emphysema, scattered areas of bronchiectasis and parenchymal scarring. Sequelae of atypical infection again noted in these areas likely reflect scarring. 2. Coronary artery and thoracoabdominal aortic atherosclerosis. Electronically Signed   By: Misty Stanley M.D.   On: 05/29/2016 13:55       Assessment & Plan:   Problem List Items Addressed This Visit    A-fib [facility])    Followed by cardiology.  Stable.  On eliquis.        Bronchiectasis without acute exacerbation ([place])    Followed by pulmonary.  Just saw Dr Mortimer Fries 05/2016.  Stable.  Recommended f/u CT in one year.       Chest pain    Chest tightness as outlined.  EKG - SR with no acute ischemic changes.  Saw cardiology last year.  Had "pretty normal"  cath.  E[employer], [employer].  Given the episodes recently, will have cardiology reevaluate with question of need for further w/up.  She is in agreement.  Will start zantac and see if improves.        Relevant Orders   EKG 12-Lead (Completed)   Ambulatory referral to Cardiology   GERD (gastroesophageal reflux disease)    Still with some chest tightness.  Last evaluated one year ago.  Cath looked "pretty normal".  Refer back to cardiology since due f/u and confirm no further w/up warranted.  Zantac as directed.  Probiotics for increased gas.  Follow.         Other Visit Diagnoses    Screening for breast cancer       Relevant Orders   MM DIGITAL SCREENING BILATERAL   Estrogen deficiency       Relevant Orders   DG Bone Density   Need for pneumococcal vaccine           Einar Pheasant, MD

## 2016-09-12 NOTE — Progress Notes (Signed)
Pre-visit discussion using our clinic review tool. No additional management support is needed unless otherwise documented below in the visit note.  

## 2016-09-16 NOTE — Telephone Encounter (Signed)
Pt scheduled  

## 2016-09-18 DIAGNOSIS — E669 Obesity, unspecified: Secondary | ICD-10-CM | POA: Diagnosis not present

## 2016-09-18 DIAGNOSIS — R011 Cardiac murmur, unspecified: Secondary | ICD-10-CM | POA: Diagnosis not present

## 2016-09-18 DIAGNOSIS — I208 Other forms of angina pectoris: Secondary | ICD-10-CM | POA: Diagnosis not present

## 2016-09-18 DIAGNOSIS — R079 Chest pain, unspecified: Secondary | ICD-10-CM | POA: Diagnosis not present

## 2016-09-21 ENCOUNTER — Encounter: Payer: Self-pay | Admitting: Internal Medicine

## 2016-09-21 NOTE — Assessment & Plan Note (Signed)
Followed by pulmonary.  Just saw Dr Mortimer Fries 05/2016.  Stable.  Recommended f/u CT in one year.

## 2016-09-21 NOTE — Assessment & Plan Note (Addendum)
Chest tightness as outlined.  EKG - SR with no acute ischemic changes.  Saw cardiology last year.  Had "pretty normal" cath.  Elected Conservation officer, nature.  Given the episodes recently, will have cardiology reevaluate with question of need for further w/up.  She is in agreement.  Will start zantac and see if improves.

## 2016-09-21 NOTE — Assessment & Plan Note (Signed)
Followed by cardiology.  Stable. On eliquis.  

## 2016-09-21 NOTE — Assessment & Plan Note (Addendum)
Still with some chest tightness.  Last evaluated one year ago.  Cath looked "pretty normal".  Refer back to cardiology since due f/u and confirm no further w/up warranted.  Zantac as directed.  Probiotics for increased gas.  Follow.

## 2016-10-28 ENCOUNTER — Ambulatory Visit (INDEPENDENT_AMBULATORY_CARE_PROVIDER_SITE_OTHER): Payer: Medicare Other | Admitting: Internal Medicine

## 2016-10-28 ENCOUNTER — Encounter: Payer: Self-pay | Admitting: Internal Medicine

## 2016-10-28 DIAGNOSIS — J479 Bronchiectasis, uncomplicated: Secondary | ICD-10-CM | POA: Diagnosis not present

## 2016-10-28 DIAGNOSIS — K219 Gastro-esophageal reflux disease without esophagitis: Secondary | ICD-10-CM | POA: Diagnosis not present

## 2016-10-28 DIAGNOSIS — D649 Anemia, unspecified: Secondary | ICD-10-CM | POA: Diagnosis not present

## 2016-10-28 DIAGNOSIS — I480 Paroxysmal atrial fibrillation: Secondary | ICD-10-CM

## 2016-10-28 DIAGNOSIS — I700 Atherosclerosis of aorta: Secondary | ICD-10-CM

## 2016-10-28 NOTE — Progress Notes (Signed)
Patient ID: TIYONNA SARDINHA, female   DOB: [birth year], 83 y.o.   MRN: [id number]   Subjective:    Patient ID: GALA PADOVANO, female    DOB: April 30, 1931, 83 y.o.   MRN: [id number]  HPI  Patient here for a scheduled follow up.  Has known afib.  On eliquis.  Recently evaluated 09/18/16 by cardiology for mild chest discomfort.  Had cath approximately one year ago that was essentially unremarkable.  Felt stable and recommended f/u in one year.  She feels good.  No chest pain.  No acid reflux.  Took zantac for a while, but does not feels needs now.  Breathing stable.  No abdominal pain.  Bowels moving.  Overall she feels she is doing well.     Past Medical History:  Diagnosis Date  . Arthritis   . Depression   . GERD (gastroesophageal reflux disease)   . Glaucoma   . History of chicken pox   . History of colon polyps   . Hx: UTI (urinary tract infection)    Past Surgical History:  Procedure Laterality Date  . ABDOMINAL HYSTERECTOMY  1974   left ovary removed  . ANKLE SURGERY Left 1998  . APPENDECTOMY  1974  . BREAST BIOPSY Right 1988   EXCISIONAL - NEG  . BREAST SURGERY Right 1988   biopsy  . CARDIAC CATHETERIZATION N/A 09/06/2015   Procedure: Right/Left Heart Cath and Coronary Angiography;  Surgeon: Yolonda Kida, MD;  Location: [facility];  Service: Cardiovascular;  Laterality: N/A;  . CHOLECYSTECTOMY  1996  . EYE SURGERY Bilateral 2005/2006   cataract  . LAPAROSCOPIC GASTRIC BANDING WITH HIATAL HERNIA REPAIR  2000  . NISSEN FUNDOPLICATION  6767  . TONSILLECTOMY AND ADENOIDECTOMY  1940   Family History  Problem Relation Age of Onset  . Arthritis Mother   . Leukemia Mother   . Lung cancer Father   . Osteoporosis Other   . Breast cancer Neg Hx    Social History   Social History  . Marital status: Married    Spouse name: N/A  . Number of children: N/A  . Years of education: N/A   Social History Main Topics  . Smoking status: Former Smoker    Packs/day: 1.00     Years: 30.00    Types: Cigarettes    Quit date: 05/20/1988  . Smokeless tobacco: Never Used  . Alcohol use No  . Drug use: No  . Sexual activity: No   Other Topics Concern  . None   Social History Narrative  . None    Outpatient Encounter Prescriptions as of 10/28/2016  Medication Sig  . apixaban (ELIQUIS) 5 MG TABS tablet Take 1 tablet (5 mg total) by mouth 2 (two) times daily.  . beta carotene w/minerals (OCUVITE) tablet Take 1 tablet by mouth daily.  . calcium-vitamin D (OSCAL WITH D) 500-200 MG-UNIT per tablet Take 1 tablet by mouth daily.  . diphenhydramine-acetaminophen (TYLENOL PM) 25-500 MG TABS Take 1 tablet by mouth at bedtime as needed.  . dorzolamide-timolol (COSOPT) 22.3-6.8 MG/ML ophthalmic solution Place 1 drop into both eyes 2 (two) times daily.  . fluocinolone (SYNALAR) 0.01 % external solution Apply topically 2 (two) times daily as needed.  . latanoprost (XALATAN) 0.005 % ophthalmic solution Place 1 drop into both eyes at bedtime.  . vitamin D, CHOLECALCIFEROL, 400 UNITS tablet Take 400 Units by mouth daily.   No facility-administered encounter medications on file as of 10/28/2016.     Review  of Systems  Constitutional: Negative for appetite change and unexpected weight change.  HENT: Negative for congestion and sinus pressure.   Respiratory: Negative for cough and chest tightness.        Breathing stable.    Cardiovascular: Negative for chest pain, palpitations and leg swelling.  Gastrointestinal: Negative for abdominal pain, diarrhea, nausea and vomiting.  Genitourinary: Negative for difficulty urinating and dysuria.  Musculoskeletal: Negative for back pain and joint swelling.  Skin: Negative for color change and rash.  Neurological: Negative for dizziness, light-headedness and headaches.  Psychiatric/Behavioral: Negative for agitation and dysphoric mood.       Objective:     Blood pressure rechecked by me:  138/70  Physical Exam  Constitutional:  She appears well-developed and well-nourished. No distress.  HENT:  Nose: Nose normal.  Mouth/Throat: Oropharynx is clear and moist.  Neck: Neck supple. No thyromegaly present.  Cardiovascular: Normal rate and regular rhythm.   Pulmonary/Chest: Breath sounds normal. No respiratory distress. She has no wheezes.  Abdominal: Soft. Bowel sounds are normal. There is no tenderness.  Musculoskeletal: She exhibits no edema or tenderness.  Lymphadenopathy:    She has no cervical adenopathy.  Skin: No rash noted. No erythema.  Psychiatric: She has a normal mood and affect. Her behavior is normal.    BP 136/66 (BP Location: Left Arm, Patient Position: Sitting, Cuff Size: Large)   Pulse 71   Temp 98.2 F (36.8 C) (Oral)   Resp 14   Ht 5\' 3"  (1.6 m)   Wt 185 lb 12.8 oz (84.3 kg)   SpO2 95%   BMI 32.91 kg/m  Wt Readings from Last 3 Encounters:  10/28/16 185 lb 12.8 oz (84.3 kg)  09/12/16 189 lb (85.7 kg)  06/10/16 185 lb 8 oz (84.1 kg)     Lab Results  Component Value Date   WBC 7.8 09/11/2016   HGB 14.2 09/11/2016   HCT 43.4 09/11/2016   PLT 235.0 09/11/2016   GLUCOSE 97 09/11/2016   CHOL 181 03/07/2016   TRIG 123.0 03/07/2016   HDL 65.00 03/07/2016   LDLCALC 91 03/07/2016   ALT 15 09/11/2016   AST 19 09/11/2016   NA 135 09/11/2016   K 4.5 09/11/2016   CL 102 09/11/2016   CREATININE 0.84 09/11/2016   BUN 13 09/11/2016   CO2 27 09/11/2016   TSH 3.73 09/11/2016    Ct Chest Wo Contrast  Result Date: 05/29/2016 CLINICAL DATA:  Multiple bilateral pulmonary nodules. EXAM: CT CHEST WITHOUT CONTRAST TECHNIQUE: Multidetector CT imaging of the chest was performed following the standard protocol without IV contrast. COMPARISON:  10/26/2015 FINDINGS: Cardiovascular: Heart size upper normal. Coronary artery calcification is noted. Atherosclerotic calcification is noted in the wall of the thoracic aorta. Mediastinum/Nodes: No mediastinal lymphadenopathy. No evidence for gross hilar  lymphadenopathy although assessment is limited by the lack of intravenous contrast on today's study. The esophagus has normal imaging features. There is no axillary lymphadenopathy. Lungs/Pleura: Biapical pleural-parenchymal scarring again noted centrilobular emphysema associated with diffuse bronchial wall thickening. As mentioned previously, scattered areas of bronchiectasis are evident with stable right middle lobe and lingular scarring. Peribronchovascular nodularity in the posterior right upper lobe and peripheral right lower lobe is stable, compatible with scarring and likely related to prior atypical infection. No focal airspace consolidation. No suspicious pulmonary nodule or mass. No pulmonary edema or pleural effusion. Upper Abdomen: Stable 4 cm hypoattenuating lesion in the dome of the liver. Other scattered tiny hypoattenuating liver lesions also appear stable. Exophytic  10 mm lesion upper pole left kidney unchanged in the interval. Musculoskeletal: Bone windows reveal no worrisome lytic or sclerotic osseous lesions. IMPRESSION: 1. Stable interval exam with emphysema, scattered areas of bronchiectasis and parenchymal scarring. Sequelae of atypical infection again noted in these areas likely reflect scarring. 2. Coronary artery and thoracoabdominal aortic atherosclerosis. Electronically Signed   By: Misty Stanley M.D.   On: 05/29/2016 13:55       Assessment & Plan:   Problem List Items Addressed This Visit    A-fib [facility])    Followed by cardiology.  Stable.  On eliquis.        Anemia    Follow [facility].       Aortic atherosclerosis (HCC)    Continue risk factor modification.        Bronchiectasis without acute exacerbation ([place])    Followed by pulmonary.  Seeing Dr Mortimer Fries.  Had CT 05/2016.  Recommended f/u with him and CT in one year - due 05/2017.       GERD (gastroesophageal reflux disease)    Does not feel needs any medication now.  Doing well.  Follow.            Einar Pheasant,  MD

## 2016-10-28 NOTE — Progress Notes (Signed)
Pre-visit discussion using our clinic review tool. No additional management support is needed unless otherwise documented below in the visit note.  

## 2016-10-29 ENCOUNTER — Encounter: Payer: Self-pay | Admitting: Internal Medicine

## 2016-10-29 DIAGNOSIS — I700 Atherosclerosis of aorta: Secondary | ICD-10-CM | POA: Insufficient documentation

## 2016-10-29 NOTE — Assessment & Plan Note (Signed)
Followed by pulmonary.  Seeing Dr Mortimer Fries.  Had CT 05/2016.  Recommended f/u with him and CT in one year - due 05/2017.

## 2016-10-29 NOTE — Assessment & Plan Note (Signed)
Continue risk factor modification 

## 2016-10-29 NOTE — Assessment & Plan Note (Signed)
Follow cbc.  

## 2016-10-29 NOTE — Assessment & Plan Note (Signed)
Does not feel needs any medication now.  Doing well.  Follow.

## 2016-10-29 NOTE — Assessment & Plan Note (Signed)
Followed by cardiology.  Stable. On eliquis.  

## 2016-10-30 ENCOUNTER — Ambulatory Visit
Admission: RE | Admit: 2016-10-30 | Discharge: 2016-10-30 | Disposition: A | Discharge status: Home / Self Care | Payer: Medicare Other | Source: Ambulatory Visit | Attending: Internal Medicine | Admitting: Internal Medicine

## 2016-10-30 DIAGNOSIS — M810 Age-related osteoporosis without current pathological fracture: Secondary | ICD-10-CM | POA: Insufficient documentation

## 2016-10-30 DIAGNOSIS — E2839 Other primary ovarian failure: Secondary | ICD-10-CM | POA: Diagnosis present

## 2016-10-30 DIAGNOSIS — Z1231 Encounter for screening mammogram for malignant neoplasm of breast: Secondary | ICD-10-CM | POA: Diagnosis not present

## 2016-10-30 DIAGNOSIS — Z13820 Encounter for screening for osteoporosis: Secondary | ICD-10-CM | POA: Diagnosis present

## 2016-10-30 DIAGNOSIS — Z1239 Encounter for other screening for malignant neoplasm of breast: Secondary | ICD-10-CM

## 2016-11-10 DIAGNOSIS — H1089 Other conjunctivitis: Secondary | ICD-10-CM | POA: Diagnosis not present

## 2016-11-11 ENCOUNTER — Telehealth: Payer: Self-pay | Admitting: *Deleted

## 2016-11-11 NOTE — Telephone Encounter (Signed)
Patient missed a call from this office, reference to her bone density test possible.   pt contact [phone]

## 2016-11-12 NOTE — Telephone Encounter (Signed)
See result note.  

## 2016-11-25 NOTE — Telephone Encounter (Signed)
Called pt l/m to call office back

## 2016-11-25 NOTE — Telephone Encounter (Signed)
Received call from patient. States she has not heard from [facility] infusion. I have called office. The lady who does scheduling and auth for those has been out on vacation. She will be back next week. I need to call patient to let her know.

## 2016-11-26 NOTE — Telephone Encounter (Signed)
Left message to return call to our office.  

## 2016-11-26 NOTE — Telephone Encounter (Signed)
Pt rtc to [place]. Pt cb [phone]

## 2016-11-26 NOTE — Telephone Encounter (Signed)
She has called back and left multiple messages returning your call.

## 2016-11-26 NOTE — Telephone Encounter (Signed)
Called back l/m to call office.

## 2016-11-28 NOTE — Telephone Encounter (Signed)
Spoke to pt

## 2016-12-02 NOTE — Telephone Encounter (Signed)
[facility] called to let you know that they have scheduled infusions for pt in Aug

## 2016-12-03 NOTE — Telephone Encounter (Signed)
Just for your information

## 2016-12-16 ENCOUNTER — Encounter: Payer: Self-pay | Admitting: Internal Medicine

## 2016-12-16 ENCOUNTER — Ambulatory Visit (INDEPENDENT_AMBULATORY_CARE_PROVIDER_SITE_OTHER): Payer: Medicare Other | Admitting: Internal Medicine

## 2016-12-16 VITALS — BP 130/80 | HR 75 | Resp 16 | Ht 66.0 in | Wt 185.0 lb

## 2016-12-16 DIAGNOSIS — J479 Bronchiectasis, uncomplicated: Secondary | ICD-10-CM | POA: Diagnosis not present

## 2016-12-16 NOTE — Progress Notes (Signed)
Subjective:    Patient ID: Bridget Briggs, female    DOB: Feb 10, 1931, 83 y.o.   MRN: [id number]  Synopsis: Referred in 2015 for evaluation of a pulmonary nodule. Found to have lingular and right middle lobe bronchiectasis which she attributes to her prior episode of pneumonia.  Repeat CT chest doing in February 2016 did not show changes in the small nodules which could represent indolent mycobacterial disease. CT chest 02/13/15 shows new GGO RML-likely related to recent pneumonia, RLL nodule CT chest  11/05/15 shows RML noldules appeared to represent some mucoid impaction within a dilated bronchus  HPI  No chest pain, no sign of infection at this time Patient has no respiratory issues at this time No cough no shortness of breath or dyspnea on exertion No chest pain no palpitations No fevers no chills  I reviewed CT scan with the patient and we will reassess a CT imaging at next visit in 6 months   CT chest reviewed with patient-no major changes,  Denies SOB/DOE, no wheezing, exercises 3 times per week  CT chest as reviewed below with patient 1. The nodule of concern in the medial segment of the right middle lobe on the prior study appeared to represent some mucoid impaction within a dilated bronchus.  2. Mild diffuse bronchial wall thickening with mild centrilobular and paraseptal emphysema; imaging findings suggestive of underlying COPD.   CT chest 05/2016 Biapical pleural-parenchymal scarring again noted centrilobular emphysema associated with diffuse bronchial wall thickening. As mentioned previously, scattered areas of bronchiectasis are evident with stable right middle lobe and lingular scarring. Peribronchovascular nodularity in the posterior right upper lobe and peripheral right lower lobe is stable, compatible with scarring and likely related to prior atypical infection. No focal airspace consolidation. No suspicious pulmonary nodule or mass. No pulmonary edema or  pleural effusion.    Review of Systems  Constitutional: Negative for fever and unexpected weight change.  HENT: Negative for congestion, dental problem, ear pain, nosebleeds, postnasal drip, rhinorrhea, sinus pressure, sneezing, sore throat and trouble swallowing.   Eyes: Negative for redness and itching.  Respiratory: Negative for cough, chest tightness, shortness of breath and wheezing.   Cardiovascular: Negative for palpitations and leg swelling.       Objective:   Physical Exam   BP 130/80 (BP Location: Left Arm, Cuff Size: Normal)   Pulse 75   Resp 16   Ht _0  (1.676 m)   Wt 185 lb (83.9 kg)   SpO2 96%   BMI 29.86 kg/m   Gen: well appearing, no acute distress HEENT: NCAT, EOMi, OP clear, neck supple without masses PULM: CTA B CV: RRR, slight systolic murmur, no JVD AB: BS+, soft, nontender Ext: warm, no edema, no clubbing, no cyanosis Derm: no rash or skin breakdown Neuro: A&Ox4, MAEW    11/15/2013 CT chest> mild centrilobular emphysema bilaterally, there is mild lingular bronchiectasis as well as right middle lobe bronchiectasis., There is also subpleural nodularity in the right lower lobe which is worrisome for Mycobacterium avium complex infection. February 2016 CT chest images reviewed today in clinic> no significant change in the right middle lobe nodules, and fact they're slightly smaller, right middle lobe and lingular bronchiectasis again noted. Mild centrilobular emphysema.  11/05/2015 The nodule of concern in the medial segment of the right middle lobe on the prior study appeared to represent some mucoid impaction within a dilated bronchus.  2. Mild diffuse bronchial wall thickening with mild centrilobular and paraseptal emphysema; imaging findings suggestive  of underlying COPD.  05/29/2016 Biapical pleural-parenchymal scarring again noted centrilobular emphysema associated with diffuse bronchial wall thickening. As mentioned previously, scattered  areas of bronchiectasis are evident with stable right middle lobe and lingular scarring. Peribronchovascular nodularity in the posterior right upper lobe and peripheral right lower lobe is stable, compatible with scarring and likely related to prior atypical infection. No focal airspace consolidation. No suspicious pulmonary nodule or mass. No pulmonary edema or pleural effusion.  Assessment & Plan:  83 yo white female with h/o COPD Gold Stage A with Bronchiectasis , no signs of pneumonia at this time    Bronchiectasis with COPD -stable no acute resp symptoms at this time -No indication for steroids No indication for antibiotics No indication for inhalers at this time   Multiple pulmonary nodules-re-assurance provided to patient -will reassess CT scan in 6 months   Follow up in 6 months   Patient satisfied with Plan of action and management. All questions answered  Corrin Parker, M.D.  [facility]  Medical Director [place] Director [facility] Cardio-Pulmonary Department

## 2016-12-16 NOTE — Patient Instructions (Addendum)
Follow up in 6 months 

## 2016-12-23 DIAGNOSIS — M810 Age-related osteoporosis without current pathological fracture: Secondary | ICD-10-CM | POA: Diagnosis not present

## 2016-12-24 DIAGNOSIS — H524 Presbyopia: Secondary | ICD-10-CM | POA: Diagnosis not present

## 2016-12-24 DIAGNOSIS — H40153 Residual stage of open-angle glaucoma, bilateral: Secondary | ICD-10-CM | POA: Diagnosis not present

## 2017-02-27 ENCOUNTER — Ambulatory Visit (INDEPENDENT_AMBULATORY_CARE_PROVIDER_SITE_OTHER): Payer: Medicare Other | Admitting: Internal Medicine

## 2017-02-27 ENCOUNTER — Encounter: Payer: Self-pay | Admitting: Internal Medicine

## 2017-02-27 DIAGNOSIS — M810 Age-related osteoporosis without current pathological fracture: Secondary | ICD-10-CM

## 2017-02-27 DIAGNOSIS — Z23 Encounter for immunization: Secondary | ICD-10-CM

## 2017-02-27 DIAGNOSIS — J479 Bronchiectasis, uncomplicated: Secondary | ICD-10-CM

## 2017-02-27 DIAGNOSIS — I700 Atherosclerosis of aorta: Secondary | ICD-10-CM | POA: Diagnosis not present

## 2017-02-27 DIAGNOSIS — I480 Paroxysmal atrial fibrillation: Secondary | ICD-10-CM

## 2017-02-27 DIAGNOSIS — R918 Other nonspecific abnormal finding of lung field: Secondary | ICD-10-CM | POA: Diagnosis not present

## 2017-02-27 NOTE — Progress Notes (Signed)
Patient ID: Bridget Briggs, female   DOB: Jan 06, 1931, 83 y.o.   MRN: [id number]   Subjective:    Patient ID: Bridget Briggs, female    DOB: 02/18/1931, 83 y.o.   MRN: [id number]  HPI  Patient here for a scheduled follow up.  Sees pulmonary.  Just evaluated by Dr Mortimer Fries 12/16/16.  Note reviewed.  Diagnosed with bronchiectasis.  Being followed for pulmonary nodules as well.  Felt stable.  Recommended f/u CT scan in 6 months.  She feels her breathing is stable.  No chest pain.  No acid reflux.  No abdominal pain.  Bowels moving.     Past Medical History:  Diagnosis Date  . Arthritis   . Depression   . GERD (gastroesophageal reflux disease)   . Glaucoma   . History of chicken pox   . History of colon polyps   . Hx: UTI (urinary tract infection)    Past Surgical History:  Procedure Laterality Date  . ABDOMINAL HYSTERECTOMY  1974   left ovary removed  . ANKLE SURGERY Left 1998  . APPENDECTOMY  1974  . BREAST BIOPSY Right 1988   EXCISIONAL - NEG  . BREAST SURGERY Right 1988   biopsy  . CARDIAC CATHETERIZATION N/A 09/06/2015   Procedure: Right/Left Heart Cath and Coronary Angiography;  Surgeon: Yolonda Kida, MD;  Location: [facility];  Service: Cardiovascular;  Laterality: N/A;  . CHOLECYSTECTOMY  1996  . EYE SURGERY Bilateral 2005/2006   cataract  . LAPAROSCOPIC GASTRIC BANDING WITH HIATAL HERNIA REPAIR  2000  . NISSEN FUNDOPLICATION  2706  . TONSILLECTOMY AND ADENOIDECTOMY  1940   Family History  Problem Relation Age of Onset  . Arthritis Mother   . Leukemia Mother   . Lung cancer Father   . Osteoporosis Other   . Breast cancer Neg Hx    Social History   Social History  . Marital status: Married    Spouse name: N/A  . Number of children: N/A  . Years of education: N/A   Social History Main Topics  . Smoking status: Former Smoker    Packs/day: 1.00    Years: 30.00    Types: Cigarettes    Quit date: 05/20/1988  . Smokeless tobacco: Never Used  . Alcohol  use No  . Drug use: No  . Sexual activity: No   Other Topics Concern  . None   Social History Narrative  . None    Outpatient Encounter Prescriptions as of 02/27/2017  Medication Sig  . apixaban (ELIQUIS) 5 MG TABS tablet Take 1 tablet (5 mg total) by mouth 2 (two) times daily.  . beta carotene w/minerals (OCUVITE) tablet Take 1 tablet by mouth daily.  . calcium-vitamin D (OSCAL WITH D) 500-200 MG-UNIT per tablet Take 1 tablet by mouth daily.  . diphenhydramine-acetaminophen (TYLENOL PM) 25-500 MG TABS Take 1 tablet by mouth at bedtime as needed.  . dorzolamide-timolol (COSOPT) 22.3-6.8 MG/ML ophthalmic solution Place 1 drop into both eyes 2 (two) times daily.  . fluocinolone (SYNALAR) 0.01 % external solution Apply topically 2 (two) times daily as needed.  . latanoprost (XALATAN) 0.005 % ophthalmic solution Place 1 drop into both eyes at bedtime.  . vitamin D, CHOLECALCIFEROL, 400 UNITS tablet Take 400 Units by mouth daily.   No facility-administered encounter medications on file as of 02/27/2017.     Review of Systems  Constitutional: Negative for appetite change and unexpected weight change.  HENT: Negative for congestion and sinus pressure.  Respiratory: Negative for cough and chest tightness.        Breathing stable.    Cardiovascular: Negative for chest pain, palpitations and leg swelling.  Gastrointestinal: Negative for abdominal pain, diarrhea, nausea and vomiting.  Genitourinary: Negative for difficulty urinating and dysuria.  Musculoskeletal: Negative for joint swelling and myalgias.  Skin: Negative for color change and rash.  Neurological: Negative for dizziness, light-headedness and headaches.  Psychiatric/Behavioral: Negative for agitation and dysphoric mood.       Objective:    Physical Exam  Constitutional: She appears well-developed and well-nourished. No distress.  HENT:  Nose: Nose normal.  Mouth/Throat: Oropharynx is clear and moist.  Neck: Neck  supple. No thyromegaly present.  Cardiovascular: Normal rate and regular rhythm.   Pulmonary/Chest: Breath sounds normal. No respiratory distress. She has no wheezes.  Abdominal: Soft. Bowel sounds are normal. There is no tenderness.  Musculoskeletal: She exhibits no edema or tenderness.  Lymphadenopathy:    She has no cervical adenopathy.  Skin: No rash noted. No erythema.  Psychiatric: She has a normal mood and affect. Her behavior is normal.    BP 128/74 (BP Location: Left Arm, Patient Position: Sitting, Cuff Size: Normal)   Pulse 83   Temp 98.4 F (36.9 C) (Oral)   Resp 14   Ht 5\' 6"  (1.676 m)   Wt 187 lb 12.8 oz (85.2 kg)   SpO2 95%   BMI 30.31 kg/m  Wt Readings from Last 3 Encounters:  02/27/17 187 lb 12.8 oz (85.2 kg)  12/16/16 185 lb (83.9 kg)  10/28/16 185 lb 12.8 oz (84.3 kg)     Lab Results  Component Value Date   WBC 7.8 09/11/2016   HGB 14.2 09/11/2016   HCT 43.4 09/11/2016   PLT 235.0 09/11/2016   GLUCOSE 97 09/11/2016   CHOL 181 03/07/2016   TRIG 123.0 03/07/2016   HDL 65.00 03/07/2016   LDLCALC 91 03/07/2016   ALT 15 09/11/2016   AST 19 09/11/2016   NA 135 09/11/2016   K 4.5 09/11/2016   CL 102 09/11/2016   CREATININE 0.84 09/11/2016   BUN 13 09/11/2016   CO2 27 09/11/2016   TSH 3.73 09/11/2016    Dg Bone Density  Result Date: 10/30/2016 EXAM: DUAL X-RAY ABSORPTIOMETRY (DXA) FOR BONE MINERAL DENSITY IMPRESSION: Dear Dr. Einar Pheasant, Your patient Bridget Briggs completed a BMD test on 10/30/2016 using the [place] (analysis version: 14.10) manufactured by [employer]. The following summarizes the results of our evaluation. PATIENT BIOGRAPHICAL: Name: Bridget, Briggs Patient ID: [id number] Birth Date: Feb 18, 1931 Height: 63.0 in. Gender: Female Exam Date: 10/30/2016 Weight: 175.4 lbs. Indications: Advanced Age, Caucasian, glaucoma, Height Loss, History of Fracture (Adult), Hysterectomy, Oopherectomy Unilateral, Postmenopausal, Previous  Smoker Fractures: Left ankle, Left wrist Treatments: CALCIUM VIT D, eliquis ASSESSMENT: The BMD measured at Forearm Radius 33% is 0.633 g/cm2 with a T-score of -2.8. This patient is considered osteoporotic according to [facility]) criteria. Lumbar spine was not utilized due to advanced degenerative changes. Patient is not a candidate for FRAX due to osteoporotic measurements of hip and wrist. Site Region Measured Measured WHO Young Adult BMD Date       Age      Classification T-score DualFemur Neck Right 10/30/2016 83.7 Osteoporosis -2.6 0.678 g/cm2 DualFemur Neck Right 06/23/2014 81.4 Osteopenia -2.4 0.711 g/cm2 Right Forearm Radius 33% 10/30/2016 83.7 Osteoporosis -2.8 0.633 g/cm2 Right Forearm Radius 33% 06/23/2014 81.4 Osteoporosis -3.3 0.588 g/cm2 World Health Organization [facility]) criteria for  post-menopausal, Caucasian Women: Normal:       T-score at or above -1 SD Osteopenia:   T-score between -1 and -2.5 SD Osteoporosis: T-score at or below -2.5 SD RECOMMENDATIONS: [place] recommends that FDA-approved medical therapies be considered in postmenopausal women and men age 56 or older with a: 1. Hip or vertebral (clinical or morphometric) fracture. 2. T-score of < -2.5 at the spine or hip. 3. Ten-year fracture probability by FRAX of 3% or greater for hip fracture or 20% or greater for major osteoporotic fracture. All treatment decisions require clinical judgment and consideration of individual patient factors, including patient preferences, co-morbidities, previous drug use, risk factors not captured in the FRAX model (e.g. falls, vitamin D deficiency, increased bone turnover, interval significant decline in bone density) and possible under - or over-estimation of fracture risk by FRAX. All patients should ensure an adequate intake of dietary calcium (1200 mg/d) and vitamin D (800 IU daily) unless contraindicated. FOLLOW-UP: People with diagnosed cases of osteoporosis or  at high risk for fracture should have regular bone mineral density tests. For patients eligible for Medicare, routine testing is allowed once every 2 years. The testing frequency can be increased to one year for patients who have rapidly progressing disease, those who are receiving or discontinuing medical therapy to restore bone mass, or have additional risk factors. I have reviewed this report, and agree with the above findings. [facility] Electronically Signed   By: Franki Cabot M.D.   On: 10/30/2016 10:52   Mm Digital Screening Bilateral  Result Date: 10/30/2016 CLINICAL DATA:  Screening. EXAM: DIGITAL SCREENING BILATERAL MAMMOGRAM WITH CAD COMPARISON:  Previous exam(s). ACR Breast Density Category b: There are scattered areas of fibroglandular density. FINDINGS: There are no findings suspicious for malignancy. Images were processed with CAD. IMPRESSION: No mammographic evidence of malignancy. A result letter of this screening mammogram will be mailed directly to the patient. RECOMMENDATION: Screening mammogram in one year. (Code:SM-B-01Y) BI-RADS CATEGORY  1: Negative. Electronically Signed   By: Pamelia Hoit M.D.   On: 10/30/2016 18:00       Assessment & Plan:   Problem List Items Addressed This Visit    Aortic atherosclerosis ([place])    Continue risk factor modification.       Relevant Orders   Lipid panel   Hepatic function panel   Atrial fibrillation (HCC)    Appears to be in SR now.  On eliquis.  Stable.  Followed by cardiology.        Relevant Orders   Basic metabolic panel   Bronchiectasis without acute exacerbation [facility])    Being followed by pulmonary - Dr Mortimer Fries.  Just evaluated.  Stable.        Multiple pulmonary nodules    Followed by pulmonary.  Recommended f/u CT chest in 6 months.        Osteoporosis    Received reclast.  Last infusion 12/2016.        Relevant Orders   VITAMIN D 25 Hydroxy (Vit-D Deficiency, Fractures)    Other Visit Diagnoses     Encounter for immunization       Relevant Orders   Flu vaccine HIGH DOSE PF (Completed)       Einar Pheasant, MD

## 2017-03-02 ENCOUNTER — Encounter: Payer: Self-pay | Admitting: Internal Medicine

## 2017-03-02 NOTE — Assessment & Plan Note (Signed)
Followed by pulmonary.  Recommended f/u CT chest in 6 months.

## 2017-03-02 NOTE — Assessment & Plan Note (Signed)
Continue risk factor modification 

## 2017-03-02 NOTE — Assessment & Plan Note (Signed)
Being followed by pulmonary - Dr Mortimer Fries.  Just evaluated.  Stable.

## 2017-03-02 NOTE — Assessment & Plan Note (Signed)
Received reclast.  Last infusion 12/2016.

## 2017-03-02 NOTE — Assessment & Plan Note (Signed)
Appears to be in SR now.  On eliquis.  Stable.  Followed by cardiology.

## 2017-04-03 DIAGNOSIS — D225 Melanocytic nevi of trunk: Secondary | ICD-10-CM | POA: Diagnosis not present

## 2017-04-03 DIAGNOSIS — L821 Other seborrheic keratosis: Secondary | ICD-10-CM | POA: Diagnosis not present

## 2017-04-03 DIAGNOSIS — D2261 Melanocytic nevi of right upper limb, including shoulder: Secondary | ICD-10-CM | POA: Diagnosis not present

## 2017-04-03 DIAGNOSIS — D2272 Melanocytic nevi of left lower limb, including hip: Secondary | ICD-10-CM | POA: Diagnosis not present

## 2017-04-16 ENCOUNTER — Telehealth: Payer: Self-pay

## 2017-04-16 DIAGNOSIS — M25562 Pain in left knee: Secondary | ICD-10-CM

## 2017-04-16 DIAGNOSIS — M25551 Pain in right hip: Secondary | ICD-10-CM

## 2017-04-16 NOTE — Telephone Encounter (Signed)
Copied from [place]. Topic: Referral - Request >> Apr 16, 2017  2:13 PM Ivar Drape wrote: Reason for CRM: Patient would like a referral for left Knee pain and right hip pain.  She heard Dr. Posey Pronto at [facility] is good for this, so she would like a referral to see him.

## 2017-04-16 NOTE — Telephone Encounter (Signed)
Order placed for ortho referral.  See her phone note.  She request to see Dr Posey Pronto at [place].

## 2017-04-25 ENCOUNTER — Ambulatory Visit: Payer: Medicare Other

## 2017-04-29 ENCOUNTER — Other Ambulatory Visit: Payer: Medicare Other

## 2017-05-01 DIAGNOSIS — H40153 Residual stage of open-angle glaucoma, bilateral: Secondary | ICD-10-CM | POA: Diagnosis not present

## 2017-05-06 ENCOUNTER — Ambulatory Visit (INDEPENDENT_AMBULATORY_CARE_PROVIDER_SITE_OTHER): Payer: Medicare Other

## 2017-05-06 VITALS — BP 126/62 | HR 81 | Temp 98.2°F | Resp 14 | Ht 63.5 in | Wt 184.1 lb

## 2017-05-06 DIAGNOSIS — Z0000 Encounter for general adult medical examination without abnormal findings: Secondary | ICD-10-CM | POA: Diagnosis not present

## 2017-05-06 NOTE — Patient Instructions (Addendum)
  Bridget Briggs , Thank you for taking time to come for your Medicare Wellness Visit. I appreciate your ongoing commitment to your health goals. Please review the following plan we discussed and let me know if I can assist you in the future.   Follow up with Dr. Nicki Reaper as needed.    Bring a copy of your Pass Christian and/or Living Will to be scanned into chart.  Have a great day and Merry Christmas!  These are the goals we discussed: Goals    . Healthy Lifestyle     Low carb, low choleserol foods. Lean meats, vegetables. Educational material provided. Stay active and continue exercise program "Young at Heart" 3 days weekly. Stay hydrated and drink plenty of fluids.         This is a list of the screening recommended for you and due dates:  Health Maintenance  Topic Date Due  . Tetanus Vaccine  01/28/1950  . Mammogram  10/30/2017  . Flu Shot  Completed  . DEXA scan (bone density measurement)  Completed  . Pneumonia vaccines  Completed

## 2017-05-06 NOTE — Progress Notes (Signed)
Subjective:   Bridget Briggs is a 83 y.o. female who presents for Medicare Annual (Subsequent) preventive examination.  Review of Systems:  No ROS.  Medicare Wellness Visit. Additional risk factors are reflected in the social history.  Cardiac Risk Factors include: advanced age (>59men, >62 women);hypertension;obesity (BMI >30kg/m2)     Objective:     Vitals: BP 126/62 (BP Location: Left Arm, Patient Position: Sitting, Cuff Size: Normal)   Pulse 81   Temp 98.2 F (36.8 C) (Oral)   Resp 14   Ht 5' 3.5" (1.613 m)   Wt 184 lb 1.9 oz (83.5 kg)   SpO2 96%   BMI 32.10 kg/m   Body mass index is 32.1 kg/m.  Advanced Directives 05/06/2017 04/25/2016 09/06/2015 06/08/2015 04/26/2015 04/20/2015  Does Patient Have a Medical Advance Directive? Yes Yes Yes Yes Yes Yes  Type of Advance Directive Living will [place];Living will - Living will [place];Living will [place];Living will  Does patient want to make changes to medical advance directive? No - Patient declined - No - Patient declined No - Patient declined - -  Copy of [place] in Chart? - No - copy requested - No - copy requested No - copy requested -    Tobacco Social History   Tobacco Use  Smoking Status Former Smoker  . Packs/day: 1.00  . Years: 30.00  . Pack years: 30.00  . Types: Cigarettes  . Last attempt to quit: 05/20/1988  . Years since quitting: 28.9  Smokeless Tobacco Never Used     Counseling given: Not Answered   Clinical Intake:  Pre-visit preparation completed: Yes  Pain : No/denies pain     Nutritional Status: BMI > 30  Obese Diabetes: No  How often do you need to have someone help you when you read instructions, pamphlets, or other written materials from your doctor or pharmacy?: 1 - Never  Interpreter Needed?: No     Past Medical History:  Diagnosis Date  . Arthritis   . Depression   . GERD (gastroesophageal  reflux disease)   . Glaucoma   . History of chicken pox   . History of colon polyps   . Hx: UTI (urinary tract infection)    Past Surgical History:  Procedure Laterality Date  . ABDOMINAL HYSTERECTOMY  1974   left ovary removed  . ANKLE SURGERY Left 1998  . APPENDECTOMY  1974  . BREAST BIOPSY Right 1988   EXCISIONAL - NEG  . BREAST SURGERY Right 1988   biopsy  . CARDIAC CATHETERIZATION N/A 09/06/2015   Procedure: Right/Left Heart Cath and Coronary Angiography;  Surgeon: Yolonda Kida, MD;  Location: [facility];  Service: Cardiovascular;  Laterality: N/A;  . CHOLECYSTECTOMY  1996  . EYE SURGERY Bilateral 2005/2006   cataract  . LAPAROSCOPIC GASTRIC BANDING WITH HIATAL HERNIA REPAIR  2000  . NISSEN FUNDOPLICATION  1194  . TONSILLECTOMY AND ADENOIDECTOMY  1940   Family History  Problem Relation Age of Onset  . Arthritis Mother   . Leukemia Mother   . Lung cancer Father   . Osteoporosis Other   . Breast cancer Neg Hx    Social History   Socioeconomic History  . Marital status: Married    Spouse name: None  . Number of children: None  . Years of education: None  . Highest education level: None  Social Needs  . Financial resource strain: None  . Food insecurity -  worry: Never true  . Food insecurity - inability: Never true  . Transportation needs - medical: No  . Transportation needs - non-medical: No  Occupational History  . None  Tobacco Use  . Smoking status: Former Smoker    Packs/day: 1.00    Years: 30.00    Pack years: 30.00    Types: Cigarettes    Last attempt to quit: 05/20/1988    Years since quitting: 28.9  . Smokeless tobacco: Never Used  Substance and Sexual Activity  . Alcohol use: No    Alcohol/week: 0.0 oz  . Drug use: No  . Sexual activity: No  Other Topics Concern  . None  Social History Narrative  . None    Outpatient Encounter Medications as of 05/06/2017  Medication Sig  . apixaban (ELIQUIS) 5 MG TABS tablet Take 1 tablet  (5 mg total) by mouth 2 (two) times daily.  . beta carotene w/minerals (OCUVITE) tablet Take 1 tablet by mouth daily.  . calcium-vitamin D (OSCAL WITH D) 500-200 MG-UNIT per tablet Take 1 tablet by mouth daily.  . diphenhydramine-acetaminophen (TYLENOL PM) 25-500 MG TABS Take 1 tablet by mouth at bedtime as needed.  . dorzolamide-timolol (COSOPT) 22.3-6.8 MG/ML ophthalmic solution Place 1 drop into both eyes 2 (two) times daily.  . fluocinolone (SYNALAR) 0.01 % external solution Apply topically 2 (two) times daily as needed.  . latanoprost (XALATAN) 0.005 % ophthalmic solution Place 1 drop into both eyes at bedtime.  . vitamin D, CHOLECALCIFEROL, 400 UNITS tablet Take 400 Units by mouth daily.   No facility-administered encounter medications on file as of 05/06/2017.     Activities of Daily Living In your present state of health, do you have any difficulty performing the following activities: 05/06/2017  Hearing? N  Vision? N  Difficulty concentrating or making decisions? N  Walking or climbing stairs? Y  Comment L knee pain, R hip pain. Referred to ortho.  Dressing or bathing? N  Doing errands, shopping? N  Preparing Food and eating ? N  Using the Toilet? N  In the past six months, have you accidently leaked urine? N  Do you have problems with loss of bowel control? N  Managing your Medications? N  Managing your Finances? N  Housekeeping or managing your Housekeeping? N  Some recent data might be hidden    Patient Care Team: Einar Pheasant, MD as PCP - General (Internal Medicine)    Assessment:   This is a routine wellness examination for Bridget Briggs. The goal of the wellness visit is to assist the patient how to close the gaps in care and create a preventative care plan for the patient.   The roster of all physicians providing medical care to patient is listed in the Snapshot section of the chart.  Taking calcium VIT D as appropriate/Osteoporosis reviewed.    Safety issues  reviewed; Smoke and carbon monoxide detectors in the home. No firearms in the home.  Wears seatbelts when driving or riding with others. Patient does wear sunscreen or protective clothing when in direct sunlight. No violence in the home.  Patient is alert, normal appearance, oriented to person/place/and time. Correctly identified the president of the [place], recall of 3/3 words, and performing simple calculations. Displays appropriate judgement and can read correct time from watch face.   No new identified risk were noted.  No failures at ADL's or IADL's.    BMI- discussed the importance of a healthy diet, water intake and the benefits of aerobic exercise.  Educational material provided.   24 hour diet recall: Breakfast: cereal Lunch: luncheon buffet; ham, pasta, vegetables Dinner: none Snack: yogurt Daily fluid intake: 1 cups of caffeine, 6-8 cups of water  Dental- every 6 months.  Eye- Visual acuity not assessed per patient preference since they have regular follow up with the ophthalmologist.  Wears corrective lenses.  Glaucoma; drops in use.  Visits every 4 months.  Sleep patterns- Sleeps 7 hours at night.  Wakes feeling rested.  TDAP vaccine deferred per patient preference.  Follow up with insurance.  Educational material provided.  Patient Concerns: None at this time. Follow up with PCP as needed.  Exercise Activities and Dietary recommendations Current Exercise Habits: Structured exercise class, Type of exercise: calisthenics;walking, Time (Minutes): 60, Frequency (Times/Week): 3, Weekly Exercise (Minutes/Week): 180, Intensity: Mild  Goals    . Healthy Lifestyle     Low carb, low choleserol foods. Lean meats, vegetables. Educational material provided. Stay active and continue exercise program "Young at Heart" 3 days weekly. Stay hydrated and drink plenty of fluids.         Fall Risk Fall Risk  05/06/2017 10/28/2016 04/25/2016 02/08/2016 07/12/2015  Falls in the past year?  No No No No No   Depression Screen PHQ 2/9 Scores 05/06/2017 10/28/2016 04/25/2016 02/08/2016  PHQ - 2 Score 0 0 0 0     Cognitive Function MMSE - Mini Mental State Exam 05/06/2017 04/26/2015  Orientation to time 5 5  Orientation to Place 5 5  Registration 3 3  Attention/ Calculation 5 5  Recall 3 3  Language- name 2 objects 2 2  Language- repeat 1 1  Language- follow 3 step command 3 3  Language- read & follow direction 1 1  Write a sentence 1 1  Copy design 1 1  Total score 30 30     6CIT Screen 04/25/2016  What Year? 0 points  What month? 0 points  What time? 0 points  Count back from 20 0 points  Months in reverse 0 points    Immunization History  Administered Date(s) Administered  . Influenza Split 02/22/2013  . Influenza, High Dose Seasonal PF 06/10/2016, 02/27/2017  . Influenza,inj,Quad PF,6+ Mos 01/20/2014, 03/02/2015  . Pneumococcal Conjugate-13 01/20/2014  . Pneumococcal Polysaccharide-23 09/12/2016   Screening Tests Health Maintenance  Topic Date Due  . TETANUS/TDAP  01/28/1950  . MAMMOGRAM  10/30/2017  . INFLUENZA VACCINE  Completed  . DEXA SCAN  Completed  . PNA vac Low Risk Adult  Completed       Plan:    End of life planning; Advance aging; Advanced directives discussed. Copy of current HCPOA/Living Will requested.    I have personally reviewed and noted the following in the patient's chart:   . Medical and social history . Use of alcohol, tobacco or illicit drugs  . Current medications and supplements . Functional ability and status . Nutritional status . Physical activity . Advanced directives . List of other physicians . Hospitalizations, surgeries, and ER visits in previous 12 months . Vitals . Screenings to include cognitive, depression, and falls . Referrals and appointments  In addition, I have reviewed and discussed with patient certain preventive protocols, quality metrics, and best practice recommendations. A written  personalized care plan for preventive services as well as general preventive health recommendations were provided to patient.     Varney Biles, LPN  13/12/6576   Reviewed above information.  Agree with assessment and plan.    Dr Nicki Reaper

## 2017-05-07 DIAGNOSIS — M25562 Pain in left knee: Secondary | ICD-10-CM | POA: Diagnosis not present

## 2017-05-07 DIAGNOSIS — M25551 Pain in right hip: Secondary | ICD-10-CM | POA: Diagnosis not present

## 2017-05-08 ENCOUNTER — Other Ambulatory Visit (INDEPENDENT_AMBULATORY_CARE_PROVIDER_SITE_OTHER): Payer: Medicare Other

## 2017-05-08 DIAGNOSIS — M810 Age-related osteoporosis without current pathological fracture: Secondary | ICD-10-CM | POA: Diagnosis not present

## 2017-05-08 DIAGNOSIS — I480 Paroxysmal atrial fibrillation: Secondary | ICD-10-CM

## 2017-05-08 DIAGNOSIS — I700 Atherosclerosis of aorta: Secondary | ICD-10-CM

## 2017-05-08 LAB — BASIC METABOLIC PANEL
BUN: 16 mg/dL (ref 6–23)
CHLORIDE: 101 meq/L (ref 96–112)
CO2: 28 mEq/L (ref 19–32)
CREATININE: 0.82 mg/dL (ref 0.40–1.20)
Calcium: 9.4 mg/dL (ref 8.4–10.5)
GFR: 70.54 mL/min (ref >60.00)
GLUCOSE: 90 mg/dL (ref 70–99)
Potassium: 4.1 mEq/L (ref 3.5–5.1)
Sodium: 135 mEq/L (ref 135–145)

## 2017-05-08 LAB — LIPID PANEL
CHOLESTEROL: 158 mg/dL (ref 0–200)
HDL: 60.1 mg/dL (ref >39.00)
LDL Cholesterol: 71 mg/dL (ref 0–99)
NonHDL: 97.64
Total CHOL/HDL Ratio: 3
Triglycerides: 131 mg/dL (ref 0.0–149.0)
VLDL: 26.2 mg/dL (ref 0.0–40.0)

## 2017-05-08 LAB — HEPATIC FUNCTION PANEL
ALBUMIN: 4.1 g/dL (ref 3.5–5.2)
ALT: 12 U/L (ref 0–35)
AST: 16 U/L (ref 0–37)
Alkaline Phosphatase: 47 U/L (ref 39–117)
BILIRUBIN TOTAL: 0.9 mg/dL (ref 0.2–1.2)
Bilirubin, Direct: 0.2 mg/dL (ref 0.0–0.3)
TOTAL PROTEIN: 7.1 g/dL (ref 6.0–8.3)

## 2017-05-08 LAB — VITAMIN D 25 HYDROXY (VIT D DEFICIENCY, FRACTURES): VITD: 46.07 ng/mL (ref 30.00–100.00)

## 2017-05-26 DIAGNOSIS — M25551 Pain in right hip: Secondary | ICD-10-CM | POA: Diagnosis not present

## 2017-05-26 DIAGNOSIS — M25562 Pain in left knee: Secondary | ICD-10-CM | POA: Diagnosis not present

## 2017-05-26 DIAGNOSIS — M2560 Stiffness of unspecified joint, not elsewhere classified: Secondary | ICD-10-CM | POA: Diagnosis not present

## 2017-05-26 DIAGNOSIS — G8929 Other chronic pain: Secondary | ICD-10-CM | POA: Diagnosis not present

## 2017-06-04 DIAGNOSIS — G8929 Other chronic pain: Secondary | ICD-10-CM | POA: Diagnosis not present

## 2017-06-04 DIAGNOSIS — M25562 Pain in left knee: Secondary | ICD-10-CM | POA: Diagnosis not present

## 2017-06-12 DIAGNOSIS — M25562 Pain in left knee: Secondary | ICD-10-CM | POA: Diagnosis not present

## 2017-06-12 DIAGNOSIS — G8929 Other chronic pain: Secondary | ICD-10-CM | POA: Diagnosis not present

## 2017-06-16 ENCOUNTER — Ambulatory Visit: Payer: Medicare Other | Admitting: Internal Medicine

## 2017-06-19 ENCOUNTER — Encounter: Payer: Self-pay | Admitting: Internal Medicine

## 2017-06-19 ENCOUNTER — Ambulatory Visit: Payer: Medicare Other | Admitting: Internal Medicine

## 2017-06-19 VITALS — BP 138/84 | HR 58 | Ht 63.5 in | Wt 182.0 lb

## 2017-06-19 DIAGNOSIS — J479 Bronchiectasis, uncomplicated: Secondary | ICD-10-CM | POA: Diagnosis not present

## 2017-06-19 NOTE — Progress Notes (Signed)
Subjective:    Patient ID: Bridget Briggs, female    DOB: [birth year], 84 y.o.   MRN: [id number]  Synopsis: Referred in 2015 for evaluation of a pulmonary nodule. Found to have lingular and right middle lobe bronchiectasis which she attributes to her prior episode of pneumonia.  Repeat CT chest doing in February 2016 did not show changes in the small nodules which could represent indolent mycobacterial disease. CT chest 02/13/15 shows new GGO RML-likely related to recent pneumonia, RLL nodule CT chest  11/05/15 shows RML noldules appeared to represent some mucoid impaction within a dilated bronchus  HPI  No chest pain, no sign of infection at this time Patient has no respiratory issues at this time No cough no shortness of breath or dyspnea on exertion No chest pain no palpitations No fevers no chills   CT chest reviewed with patient-no major changes,  Denies SOB/DOE, no wheezing, exercises 3 times per week  CT chest as reviewed below with patient 1. The nodule of concern in the medial segment of the right middle lobe on the prior study appeared to represent some mucoid impaction within a dilated bronchus.  2. Mild diffuse bronchial wall thickening with mild centrilobular and paraseptal emphysema; imaging findings suggestive of underlying COPD.   CT chest 05/2016 Biapical pleural-parenchymal scarring again noted centrilobular emphysema associated with diffuse bronchial wall thickening. As mentioned previously, scattered areas of bronchiectasis are evident with stable right middle lobe and lingular scarring. Peribronchovascular nodularity in the posterior right upper lobe and peripheral right lower lobe is stable, compatible with scarring and likely related to prior atypical infection. No focal airspace consolidation. No suspicious pulmonary nodule or mass. No pulmonary edema or pleural effusion.    Review of Systems  Constitutional: Negative for fever and unexpected  weight change.  HENT: Negative for congestion, dental problem, ear pain, nosebleeds, postnasal drip, rhinorrhea, sinus pressure, sneezing, sore throat and trouble swallowing.   Eyes: Negative for redness and itching.  Respiratory: Negative for cough, chest tightness, shortness of breath and wheezing.   Cardiovascular: Negative for palpitations and leg swelling.       Objective:   Physical Exam   BP 138/84 (BP Location: Left Arm, Cuff Size: Normal)   Pulse (!) 58   Ht 5' 3.5" (1.613 m)   Wt 182 lb (82.6 kg)   SpO2 97%   BMI 31.73 kg/m   Gen: well appearing, no acute distress HEENT: NCAT, EOMi, OP clear, neck supple without masses PULM: CTA B CV: RRR, slight systolic murmur, no JVD AB: BS+, soft, nontender Ext: warm, no edema, no clubbing, no cyanosis Derm: no rash or skin breakdown Neuro: A&Ox4, MAEW    11/15/2013 CT chest> mild centrilobular emphysema bilaterally, there is mild lingular bronchiectasis as well as right middle lobe bronchiectasis., There is also subpleural nodularity in the right lower lobe which is worrisome for Mycobacterium avium complex infection. February 2016 CT chest images reviewed today in clinic> no significant change in the right middle lobe nodules, and fact they're slightly smaller, right middle lobe and lingular bronchiectasis again noted. Mild centrilobular emphysema.  11/05/2015 The nodule of concern in the medial segment of the right middle lobe on the prior study appeared to represent some mucoid impaction within a dilated bronchus.  2. Mild diffuse bronchial wall thickening with mild centrilobular and paraseptal emphysema; imaging findings suggestive of underlying COPD.  05/29/2016 Biapical pleural-parenchymal scarring again noted centrilobular emphysema associated with diffuse bronchial wall thickening. As mentioned previously, scattered areas of  bronchiectasis are evident with stable right middle lobe and lingular scarring.  Peribronchovascular nodularity in the posterior right upper lobe and peripheral right lower lobe is stable, compatible with scarring and likely related to prior atypical infection. No focal airspace consolidation. No suspicious pulmonary nodule or mass. No pulmonary edema or pleural effusion.  Assessment & Plan:  84 yo white female with h/o COPD Gold Stage A with Bronchiectasis , no signs of pneumonia at this time    Bronchiectasis with COPD -stable no acute resp symptoms at this time -No indication for steroids No indication for antibiotics No indication for inhalers at this time   Multiple pulmonary nodules-re-assurance provided to patient Previous CT chest 05/2016 Biapical pleural-parenchymal scarring again noted centrilobular emphysema associated with diffuse bronchial wall thickening. As mentioned previously, scattered areas of bronchiectasis are evident with stable right middle lobe and lingular scarring. Peribronchovascular nodularity in the posterior right upper lobe and peripheral right lower lobe is stable, compatible with scarring and likely related to prior atypical infection. No focal airspace consolidation. No suspicious pulmonary nodule or mass. No pulmonary edema or pleural effusion.  No further CT scans needed  Follow up as needed   Patient satisfied with Plan of action and management. All questions answered  Corrin Parker, M.D.  [facility]  Medical Director [place] Director [facility] Card[facility] Department

## 2017-06-20 DIAGNOSIS — G8929 Other chronic pain: Secondary | ICD-10-CM | POA: Diagnosis not present

## 2017-06-20 DIAGNOSIS — M25562 Pain in left knee: Secondary | ICD-10-CM | POA: Diagnosis not present

## 2017-06-27 DIAGNOSIS — G8929 Other chronic pain: Secondary | ICD-10-CM | POA: Diagnosis not present

## 2017-06-27 DIAGNOSIS — M25562 Pain in left knee: Secondary | ICD-10-CM | POA: Diagnosis not present

## 2017-06-30 ENCOUNTER — Ambulatory Visit: Payer: Medicare Other | Admitting: Internal Medicine

## 2017-07-03 ENCOUNTER — Encounter: Payer: Self-pay | Admitting: Internal Medicine

## 2017-07-03 ENCOUNTER — Ambulatory Visit: Payer: Medicare Other | Admitting: Internal Medicine

## 2017-07-03 DIAGNOSIS — D649 Anemia, unspecified: Secondary | ICD-10-CM

## 2017-07-03 DIAGNOSIS — E538 Deficiency of other specified B group vitamins: Secondary | ICD-10-CM

## 2017-07-03 DIAGNOSIS — M810 Age-related osteoporosis without current pathological fracture: Secondary | ICD-10-CM

## 2017-07-03 DIAGNOSIS — J479 Bronchiectasis, uncomplicated: Secondary | ICD-10-CM

## 2017-07-03 DIAGNOSIS — I700 Atherosclerosis of aorta: Secondary | ICD-10-CM | POA: Diagnosis not present

## 2017-07-03 DIAGNOSIS — I480 Paroxysmal atrial fibrillation: Secondary | ICD-10-CM | POA: Diagnosis not present

## 2017-07-03 NOTE — Progress Notes (Signed)
Patient ID: Bridget Briggs, female   DOB: 02/28/1931, 84 y.o.   MRN: [id number]   Subjective:    Patient ID: Bridget Briggs, female    DOB: [birth year], 84 y.o.   MRN: [id number]  HPI  Patient here for a scheduled follow up.  Sees pulmonary for bronchiectasis and f/u pulmonary nodules.  Just evaluated by Dr Mortimer Fries 06/19/17.  Note reviewed.  Felt stable.  Recommended no further CT scans.  Breathing stable.  No chest pain.  No acid reflux.  No abdominal pain.  Bowels moving.  Due to f/u with Dr Clayborn Bigness 09/30/17.  Gong to the gym 3 days per week.  Blood pressures doing well.     Past Medical History:  Diagnosis Date  . Arthritis   . Depression   . GERD (gastroesophageal reflux disease)   . Glaucoma   . History of chicken pox   . History of colon polyps   . Hx: UTI (urinary tract infection)    Past Surgical History:  Procedure Laterality Date  . ABDOMINAL HYSTERECTOMY  1974   left ovary removed  . ANKLE SURGERY Left 1998  . APPENDECTOMY  1974  . BREAST BIOPSY Right 1988   EXCISIONAL - NEG  . BREAST SURGERY Right 1988   biopsy  . CARDIAC CATHETERIZATION N/A 09/06/2015   Procedure: Right/Left Heart Cath and Coronary Angiography;  Surgeon: Yolonda Kida, MD;  Location: [place] [facility];  Service: Cardiovascular;  Laterality: N/A;  . CHOLECYSTECTOMY  1996  . EYE SURGERY Bilateral 2005/2006   cataract  . LAPAROSCOPIC GASTRIC BANDING WITH HIATAL HERNIA REPAIR  2000  . NISSEN FUNDOPLICATION  9678  . TONSILLECTOMY AND ADENOIDECTOMY  1940   Family History  Problem Relation Age of Onset  . Arthritis Mother   . Leukemia Mother   . Lung cancer Father   . Osteoporosis Other   . Breast cancer Neg Hx    Social History   Socioeconomic History  . Marital status: Married    Spouse name: None  . Number of children: None  . Years of education: None  . Highest education level: None  Social Needs  . Financial resource strain: None  . Food insecurity - worry: Never true  . Food  insecurity - inability: Never true  . Transportation needs - medical: No  . Transportation needs - non-medical: No  Occupational History  . None  Tobacco Use  . Smoking status: Former Smoker    Packs/day: 1.00    Years: 30.00    Pack years: 30.00    Types: Cigarettes    Last attempt to quit: 05/20/1988    Years since quitting: 29.1  . Smokeless tobacco: Never Used  Substance and Sexual Activity  . Alcohol use: No    Alcohol/week: 0.0 oz  . Drug use: No  . Sexual activity: No  Other Topics Concern  . None  Social History Narrative  . None    Outpatient Encounter Medications as of 07/03/2017  Medication Sig  . apixaban (ELIQUIS) 5 MG TABS tablet Take 1 tablet (5 mg total) by mouth 2 (two) times daily.  Marland Kitchen Apoaequorin (PREVAGEN PO) Take 1 tablet by mouth daily.  . beta carotene w/minerals (OCUVITE) tablet Take 1 tablet by mouth daily.  . calcium-vitamin D (OSCAL WITH D) 500-200 MG-UNIT per tablet Take 1 tablet by mouth daily.  . diphenhydramine-acetaminophen (TYLENOL PM) 25-500 MG TABS Take 1 tablet by mouth at bedtime as needed.  . dorzolamide-timolol (COSOPT) 22.3-6.8 MG/ML ophthalmic solution  Place 1 drop into both eyes 2 (two) times daily.  . fluocinolone (SYNALAR) 0.01 % external solution Apply topically 2 (two) times daily as needed.  . latanoprost (XALATAN) 0.005 % ophthalmic solution Place 1 drop into both eyes at bedtime.  . vitamin D, CHOLECALCIFEROL, 400 UNITS tablet Take 400 Units by mouth daily.   No facility-administered encounter medications on file as of 07/03/2017.     Review of Systems  Constitutional: Negative for appetite change and unexpected weight change.  HENT: Negative for congestion and sinus pressure.   Respiratory: Negative for chest tightness.        Breathing stable.    Cardiovascular: Negative for chest pain, palpitations and leg swelling.  Gastrointestinal: Negative for abdominal pain, diarrhea, nausea and vomiting.  Genitourinary: Negative for  difficulty urinating and dysuria.  Musculoskeletal: Negative for joint swelling and myalgias.  Skin: Negative for color change and rash.  Neurological: Negative for dizziness, light-headedness and headaches.  Psychiatric/Behavioral: Negative for agitation and dysphoric mood.       Objective:    Physical Exam  Constitutional: She appears well-developed and well-nourished. No distress.  HENT:  Nose: Nose normal.  Mouth/Throat: Oropharynx is clear and moist.  Neck: Neck supple. No thyromegaly present.  Cardiovascular: Normal rate and regular rhythm.  Pulmonary/Chest: Breath sounds normal. No respiratory distress. She has no wheezes.  Abdominal: Soft. Bowel sounds are normal. There is no tenderness.  Musculoskeletal: She exhibits no edema or tenderness.  Lymphadenopathy:    She has no cervical adenopathy.  Skin: No rash noted. No erythema.  Psychiatric: She has a normal mood and affect. Her behavior is normal.    BP 128/70 (BP Location: Left Arm, Patient Position: Sitting, Cuff Size: Normal)   Pulse 71   Temp (!) 97.5 F (36.4 C) (Oral)   Resp 16   Wt 183 lb 9.6 oz (83.3 kg)   SpO2 96%   BMI 32.01 kg/m  Wt Readings from Last 3 Encounters:  07/03/17 183 lb 9.6 oz (83.3 kg)  06/19/17 182 lb (82.6 kg)  05/06/17 184 lb 1.9 oz (83.5 kg)     Lab Results  Component Value Date   WBC 7.8 09/11/2016   HGB 14.2 09/11/2016   HCT 43.4 09/11/2016   PLT 235.0 09/11/2016   GLUCOSE 90 05/08/2017   CHOL 158 05/08/2017   TRIG 131.0 05/08/2017   HDL 60.10 05/08/2017   LDLCALC 71 05/08/2017   ALT 12 05/08/2017   AST 16 05/08/2017   NA 135 05/08/2017   K 4.1 05/08/2017   CL 101 05/08/2017   CREATININE 0.82 05/08/2017   BUN 16 05/08/2017   CO2 28 05/08/2017   TSH 3.73 09/11/2016    Dg Bone Density  Result Date: 10/30/2016 EXAM: DUAL X-RAY ABSORPTIOMETRY (DXA) FOR BONE MINERAL DENSITY IMPRESSION: Dear Dr. Einar Pheasant, Your patient Bridget Briggs completed a BMD test on  10/30/2016 using the [place] (analysis version: 14.10) manufactured by [employer]. The following summarizes the results of our evaluation. PATIENT BIOGRAPHICAL: Name: Bridget Briggs Patient ID: [id number] Birth Date: 24-Jan-1931 Height: 63.0 in. Gender: Female Exam Date: 10/30/2016 Weight: 175.4 lbs. Indications: Advanced Age, Caucasian, glaucoma, Height Loss, History of Fracture (Adult), Hysterectomy, Oopherectomy Unilateral, Postmenopausal, Previous Smoker Fractures: Left ankle, Left wrist Treatments: CALCIUM VIT D, eliquis ASSESSMENT: The BMD measured at Forearm Radius 33% is 0.633 g/cm2 with a T-score of -2.8. This patient is considered osteoporotic according to [facility]) criteria. Lumbar spine was not utilized due to  advanced degenerative changes. Patient is not a candidate for FRAX due to osteoporotic measurements of hip and wrist. Site Region Measured Measured WHO Young Adult BMD Date       Age      Classification T-score DualFemur Neck Right 10/30/2016 83.7 Osteoporosis -2.6 0.678 g/cm2 DualFemur Neck Right 06/23/2014 81.4 Osteopenia -2.4 0.711 g/cm2 Right Forearm Radius 33% 10/30/2016 83.7 Osteoporosis -2.8 0.633 g/cm2 Right Forearm Radius 33% 06/23/2014 81.4 Osteoporosis -3.3 0.588 g/cm2 World Health Organization [facility]) criteria for post-menopausal, Caucasian Women: Normal:       T-score at or above -1 SD Osteopenia:   T-score between -1 and -2.5 SD Osteoporosis: T-score at or below -2.5 SD RECOMMENDATIONS: [place] recommends that FDA-approved medical therapies be considered in postmenopausal women and men age 52 or older with a: 1. Hip or vertebral (clinical or morphometric) fracture. 2. T-score of < -2.5 at the spine or hip. 3. Ten-year fracture probability by FRAX of 3% or greater for hip fracture or 20% or greater for major osteoporotic fracture. All treatment decisions require clinical judgment and consideration of individual patient  factors, including patient preferences, co-morbidities, previous drug use, risk factors not captured in the FRAX model (e.g. falls, vitamin D deficiency, increased bone turnover, interval significant decline in bone density) and possible under - or over-estimation of fracture risk by FRAX. All patients should ensure an adequate intake of dietary calcium (1200 mg/d) and vitamin D (800 IU daily) unless contraindicated. FOLLOW-UP: People with diagnosed cases of osteoporosis or at high risk for fracture should have regular bone mineral density tests. For patients eligible for Medicare, routine testing is allowed once every 2 years. The testing frequency can be increased to one year for patients who have rapidly progressing disease, those who are receiving or discontinuing medical therapy to restore bone mass, or have additional risk factors. I have reviewed this report, and agree with the above findings. [facility] Radiology Electronically Signed   By: Franki Cabot M.D.   On: 10/30/2016 10:52   Mm Digital Screening Bilateral  Result Date: 10/30/2016 CLINICAL DATA:  Screening. EXAM: DIGITAL SCREENING BILATERAL MAMMOGRAM WITH CAD COMPARISON:  Previous exam(s). ACR Breast Density Category b: There are scattered areas of fibroglandular density. FINDINGS: There are no findings suspicious for malignancy. Images were processed with CAD. IMPRESSION: No mammographic evidence of malignancy. A result letter of this screening mammogram will be mailed directly to the patient. RECOMMENDATION: Screening mammogram in one year. (Code:SM-B-01Y) BI-RADS CATEGORY  1: Negative. Electronically Signed   By: Pamelia Hoit M.D.   On: 10/30/2016 18:00       Assessment & Plan:   Problem List Items Addressed This Visit    Anemia    Follow cbc.       Relevant Orders   CBC with Differential/Platelet   Aortic atherosclerosis ([place])    Continue risk factor modification.       Relevant Orders   Lipid panel   Hepatic function panel     Basic metabolic panel   Atrial fibrillation ([place])    On eliquis.  Stable.  Followed by cardiollogy.        Relevant Orders   TSH   B12 deficiency    Continue b12 injections.       Bronchiectasis without acute exacerbation ([place])    Has been followed by Dr Mortimer Fries.  Stable.  Just evaluated as outlned.        Osteoporosis    Received reclast.  Last infusion 12/2016.  Follow.  Einar Pheasant, MD

## 2017-07-06 ENCOUNTER — Encounter: Payer: Self-pay | Admitting: Internal Medicine

## 2017-07-06 NOTE — Assessment & Plan Note (Signed)
On eliquis.  Stable.  Followed by cardiollogy.

## 2017-07-06 NOTE — Assessment & Plan Note (Signed)
Received reclast.  Last infusion 12/2016.  Follow.

## 2017-07-06 NOTE — Assessment & Plan Note (Signed)
Follow cbc.  

## 2017-07-06 NOTE — Assessment & Plan Note (Signed)
Continue b12 injections.  

## 2017-07-06 NOTE — Assessment & Plan Note (Signed)
Continue risk factor modification 

## 2017-07-06 NOTE — Assessment & Plan Note (Signed)
Has been followed by Dr Mortimer Fries.  Stable.  Just evaluated as outlned.

## 2017-07-09 DIAGNOSIS — G8929 Other chronic pain: Secondary | ICD-10-CM | POA: Diagnosis not present

## 2017-07-09 DIAGNOSIS — M25562 Pain in left knee: Secondary | ICD-10-CM | POA: Diagnosis not present

## 2017-09-02 DIAGNOSIS — H40153 Residual stage of open-angle glaucoma, bilateral: Secondary | ICD-10-CM | POA: Diagnosis not present

## 2017-09-30 DIAGNOSIS — R079 Chest pain, unspecified: Secondary | ICD-10-CM | POA: Diagnosis not present

## 2017-09-30 DIAGNOSIS — I208 Other forms of angina pectoris: Secondary | ICD-10-CM | POA: Diagnosis not present

## 2017-09-30 DIAGNOSIS — E669 Obesity, unspecified: Secondary | ICD-10-CM | POA: Diagnosis not present

## 2017-09-30 DIAGNOSIS — R011 Cardiac murmur, unspecified: Secondary | ICD-10-CM | POA: Diagnosis not present

## 2017-10-28 ENCOUNTER — Other Ambulatory Visit (INDEPENDENT_AMBULATORY_CARE_PROVIDER_SITE_OTHER): Payer: Medicare Other

## 2017-10-28 DIAGNOSIS — D649 Anemia, unspecified: Secondary | ICD-10-CM

## 2017-10-28 DIAGNOSIS — I480 Paroxysmal atrial fibrillation: Secondary | ICD-10-CM

## 2017-10-28 DIAGNOSIS — I700 Atherosclerosis of aorta: Secondary | ICD-10-CM | POA: Diagnosis not present

## 2017-10-28 LAB — CBC WITH DIFFERENTIAL/PLATELET
BASOS ABS: 0.1 10*3/uL (ref 0.0–0.1)
Basophils Relative: 1.1 % (ref 0.0–3.0)
Eosinophils Absolute: 0.1 10*3/uL (ref 0.0–0.7)
Eosinophils Relative: 1.9 % (ref 0.0–5.0)
HCT: 46.9 % — ABNORMAL HIGH (ref 36.0–46.0)
HEMOGLOBIN: 15.7 g/dL — AB (ref 12.0–15.0)
Lymphocytes Relative: 37.3 % (ref 12.0–46.0)
Lymphs Abs: 2.3 10*3/uL (ref 0.7–4.0)
MCHC: 33.4 g/dL (ref 30.0–36.0)
MCV: 87.8 fl (ref 78.0–100.0)
MONOS PCT: 8.5 % (ref 3.0–12.0)
Monocytes Absolute: 0.5 10*3/uL (ref 0.1–1.0)
NEUTROS PCT: 51.2 % (ref 43.0–77.0)
Neutro Abs: 3.2 10*3/uL (ref 1.4–7.7)
Platelets: 242 10*3/uL (ref 150.0–400.0)
RBC: 5.34 Mil/uL — AB (ref 3.87–5.11)
RDW: 14.1 % (ref 11.5–15.5)
WBC: 6.3 10*3/uL (ref 4.0–10.5)

## 2017-10-28 LAB — LIPID PANEL
CHOLESTEROL: 204 mg/dL — AB (ref 0–200)
HDL: 59 mg/dL (ref >39.00)
LDL Cholesterol: 117 mg/dL — ABNORMAL HIGH (ref 0–99)
NonHDL: 144.59
Total CHOL/HDL Ratio: 3
Triglycerides: 138 mg/dL (ref 0.0–149.0)
VLDL: 27.6 mg/dL (ref 0.0–40.0)

## 2017-10-28 LAB — HEPATIC FUNCTION PANEL
ALBUMIN: 4 g/dL (ref 3.5–5.2)
ALT: 15 U/L (ref 0–35)
AST: 20 U/L (ref 0–37)
Alkaline Phosphatase: 51 U/L (ref 39–117)
Bilirubin, Direct: 0.2 mg/dL (ref 0.0–0.3)
Total Bilirubin: 0.7 mg/dL (ref 0.2–1.2)
Total Protein: 6.9 g/dL (ref 6.0–8.3)

## 2017-10-28 LAB — BASIC METABOLIC PANEL
BUN: 16 mg/dL (ref 6–23)
CO2: 26 meq/L (ref 19–32)
Calcium: 9.7 mg/dL (ref 8.4–10.5)
Chloride: 106 mEq/L (ref 96–112)
Creatinine, Ser: 1 mg/dL (ref 0.40–1.20)
GFR: 56.04 mL/min — ABNORMAL LOW (ref >60.00)
GLUCOSE: 95 mg/dL (ref 70–99)
POTASSIUM: 4.3 meq/L (ref 3.5–5.1)
Sodium: 140 mEq/L (ref 135–145)

## 2017-10-28 LAB — TSH: TSH: 8.67 u[IU]/mL — AB (ref 0.35–4.50)

## 2017-10-30 ENCOUNTER — Ambulatory Visit (INDEPENDENT_AMBULATORY_CARE_PROVIDER_SITE_OTHER): Payer: Medicare Other | Admitting: Internal Medicine

## 2017-10-30 ENCOUNTER — Encounter: Payer: Self-pay | Admitting: Internal Medicine

## 2017-10-30 VITALS — BP 118/76 | HR 127 | Temp 97.8°F | Resp 15 | Ht 63.5 in | Wt 184.0 lb

## 2017-10-30 DIAGNOSIS — D649 Anemia, unspecified: Secondary | ICD-10-CM | POA: Diagnosis not present

## 2017-10-30 DIAGNOSIS — I480 Paroxysmal atrial fibrillation: Secondary | ICD-10-CM | POA: Diagnosis not present

## 2017-10-30 DIAGNOSIS — E538 Deficiency of other specified B group vitamins: Secondary | ICD-10-CM

## 2017-10-30 DIAGNOSIS — M810 Age-related osteoporosis without current pathological fracture: Secondary | ICD-10-CM

## 2017-10-30 DIAGNOSIS — J479 Bronchiectasis, uncomplicated: Secondary | ICD-10-CM

## 2017-10-30 DIAGNOSIS — Z0000 Encounter for general adult medical examination without abnormal findings: Secondary | ICD-10-CM | POA: Diagnosis not present

## 2017-10-30 MED ORDER — METOPROLOL TARTRATE 25 MG PO TABS: 25.0000 mg | ORAL_TABLET | Freq: Two times a day (BID) | ORAL | 2 refills | Status: DC

## 2017-10-30 NOTE — Assessment & Plan Note (Signed)
Physical today 10/30/17.  Mammogram 10/30/16 - birads I.  Colonoscopy 2011.

## 2017-10-30 NOTE — Patient Instructions (Signed)
appt with Dr Mariam Dollar - 11/04/17 at 4:00

## 2017-10-30 NOTE — Progress Notes (Signed)
Patient ID: SIMON AABERG, female   DOB: 06-10-1930, 84 y.o.   MRN: [id number]   Subjective:    Patient ID: ANABELLE BUNGERT, female    DOB: 12-25-1930, 84 y.o.   MRN: [id number]  HPI  Patient here for her physical exam.  She reports she is doing well.  Just saw cardiology 09/2017.  Felt stable.  Recommended continuing eliquis.  Recommended f/u in one year.  She denies any chest pain.  No increased sob.  Breathing stable.  Has not noticed any increased heart racing or palpitations.  States she has noticed perspiring more recently, but no associated symptoms.  Goes to [facility].  Overall feels things are stable.     Past Medical History:  Diagnosis Date  . Arthritis   . Depression   . GERD (gastroesophageal reflux disease)   . Glaucoma   . History of chicken pox   . History of colon polyps   . Hx: UTI (urinary tract infection)    Past Surgical History:  Procedure Laterality Date  . ABDOMINAL HYSTERECTOMY  1974   left ovary removed  . ANKLE SURGERY Left 1998  . APPENDECTOMY  1974  . BREAST BIOPSY Right 1988   EXCISIONAL - NEG  . BREAST SURGERY Right 1988   biopsy  . CARDIAC CATHETERIZATION N/A 09/06/2015   Procedure: Right/Left Heart Cath and Coronary Angiography;  Surgeon: Yolonda Kida, MD;  Location: [place] [facility];  Service: Cardiovascular;  Laterality: N/A;  . CHOLECYSTECTOMY  1996  . EYE SURGERY Bilateral 2005/2006   cataract  . LAPAROSCOPIC GASTRIC BANDING WITH HIATAL HERNIA REPAIR  2000  . NISSEN FUNDOPLICATION  0263  . TONSILLECTOMY AND ADENOIDECTOMY  1940   Family History  Problem Relation Age of Onset  . Arthritis Mother   . Leukemia Mother   . Lung cancer Father   . Osteoporosis Other   . Breast cancer Neg Hx    Social History   Socioeconomic History  . Marital status: Married    Spouse name: Not on file  . Number of children: Not on file  . Years of education: Not on file  . Highest education level: Not on file  Occupational History  . Not on  file  Social Needs  . Financial resource strain: Not on file  . Food insecurity:    Worry: Never true    Inability: Never true  . Transportation needs:    Medical: No    Non-medical: No  Tobacco Use  . Smoking status: Former Smoker    Packs/day: 1.00    Years: 30.00    Pack years: 30.00    Types: Cigarettes    Last attempt to quit: 05/20/1988    Years since quitting: 29.4  . Smokeless tobacco: Never Used  Substance and Sexual Activity  . Alcohol use: No    Alcohol/week: 0.0 oz  . Drug use: No  . Sexual activity: Never  Lifestyle  . Physical activity:    Days per week: Not on file    Minutes per session: Not on file  . Stress: Not on file  Relationships  . Social connections:    Talks on phone: Patient refused    Gets together: Patient refused    Attends religious service: Patient refused    Active member of club or organization: Patient refused    Attends meetings of clubs or organizations: Patient refused    Relationship status: Patient refused  Other Topics Concern  . Not on file  Social History Narrative  . Not on file    Outpatient Encounter Medications as of 10/30/2017  Medication Sig  . apixaban (ELIQUIS) 5 MG TABS tablet Take 1 tablet (5 mg total) by mouth 2 (two) times daily.  Marland Kitchen Apoaequorin (PREVAGEN PO) Take 1 tablet by mouth daily.  . beta carotene w/minerals (OCUVITE) tablet Take 1 tablet by mouth daily.  . calcium-vitamin D (OSCAL WITH D) 500-200 MG-UNIT per tablet Take 1 tablet by mouth daily.  . diphenhydramine-acetaminophen (TYLENOL PM) 25-500 MG TABS Take 1 tablet by mouth at bedtime as needed.  . dorzolamide-timolol (COSOPT) 22.3-6.8 MG/ML ophthalmic solution Place 1 drop into both eyes 2 (two) times daily.  . fluocinolone (SYNALAR) 0.01 % external solution Apply topically 2 (two) times daily as needed.  . latanoprost (XALATAN) 0.005 % ophthalmic solution Place 1 drop into both eyes at bedtime.  . vitamin D, CHOLECALCIFEROL, 400 UNITS tablet Take 400  Units by mouth daily.  . metoprolol tartrate (LOPRESSOR) 25 MG tablet Take 1 tablet (25 mg total) by mouth 2 (two) times daily.   No facility-administered encounter medications on file as of 10/30/2017.     Review of Systems  Constitutional: Negative for appetite change and unexpected weight change.  HENT: Negative for congestion and sinus pressure.   Eyes: Negative for pain and visual disturbance.  Respiratory: Negative for cough and chest tightness.        Breathing stable.    Cardiovascular: Negative for chest pain, palpitations and leg swelling.  Gastrointestinal: Negative for abdominal pain, diarrhea and nausea.  Genitourinary: Negative for difficulty urinating and dysuria.  Musculoskeletal: Negative for joint swelling and myalgias.  Skin: Negative for color change and rash.  Neurological: Negative for dizziness, light-headedness and headaches.  Hematological: Negative for adenopathy. Does not bruise/bleed easily.  Psychiatric/Behavioral: Negative for agitation and dysphoric mood.       Objective:    Physical Exam  Constitutional: She is oriented to person, place, and time. She appears well-developed and well-nourished. No distress.  HENT:  Nose: Nose normal.  Mouth/Throat: Oropharynx is clear and moist.  Eyes: Right eye exhibits no discharge. Left eye exhibits no discharge. No scleral icterus.  Neck: Neck supple. No thyromegaly present.  Cardiovascular:  Irregular rhythm with ventricular rate 120  Pulmonary/Chest: Breath sounds normal. No accessory muscle usage. No tachypnea. No respiratory distress. She has no decreased breath sounds. She has no wheezes. She has no rhonchi. Right breast exhibits no inverted nipple, no mass, no nipple discharge and no tenderness (no axillary adenopathy). Left breast exhibits no inverted nipple, no mass, no nipple discharge and no tenderness (no axilarry adenopathy).  Abdominal: Soft. Bowel sounds are normal. There is no tenderness.    Musculoskeletal: She exhibits no edema or tenderness.  Lymphadenopathy:    She has no cervical adenopathy.  Neurological: She is alert and oriented to person, place, and time.  Skin: No rash noted. No erythema.  Psychiatric: She has a normal mood and affect. Her behavior is normal.    BP 118/76 (BP Location: Left Arm, Patient Position: Sitting, Cuff Size: Normal)   Pulse (!) 127   Temp 97.8 F (36.6 C) (Oral)   Resp 15   Ht 5' 3.5" (1.613 m)   Wt 184 lb (83.5 kg)   SpO2 97%   BMI 32.08 kg/m  Wt Readings from Last 3 Encounters:  10/30/17 184 lb (83.5 kg)  07/03/17 183 lb 9.6 oz (83.3 kg)  06/19/17 182 lb (82.6 kg)     Lab  Results  Component Value Date   WBC 6.3 10/28/2017   HGB 15.7 (H) 10/28/2017   HCT 46.9 (H) 10/28/2017   PLT 242.0 10/28/2017   GLUCOSE 95 10/28/2017   CHOL 204 (H) 10/28/2017   TRIG 138.0 10/28/2017   HDL 59.00 10/28/2017   LDLCALC 117 (H) 10/28/2017   ALT 15 10/28/2017   AST 20 10/28/2017   NA 140 10/28/2017   K 4.3 10/28/2017   CL 106 10/28/2017   CREATININE 1.00 10/28/2017   BUN 16 10/28/2017   CO2 26 10/28/2017   TSH 8.67 (H) 10/28/2017    Dg Bone Density  Result Date: 10/30/2016 EXAM: DUAL X-RAY ABSORPTIOMETRY (DXA) FOR BONE MINERAL DENSITY IMPRESSION: Dear Dr. Einar Pheasant, Your patient Jazmine Heckman completed a BMD test on 10/30/2016 using the [place] (analysis version: 14.10) manufactured by [employer]. The following summarizes the results of our evaluation. PATIENT BIOGRAPHICAL: Name: Disa, Riedlinger Patient ID: [id number] Birth Date: 10-12-1930 Height: 63.0 in. Gender: Female Exam Date: 10/30/2016 Weight: 175.4 lbs. Indications: Advanced Age, Caucasian, glaucoma, Height Loss, History of Fracture (Adult), Hysterectomy, Oopherectomy Unilateral, Postmenopausal, Previous Smoker Fractures: Left ankle, Left wrist Treatments: CALCIUM VIT D, eliquis ASSESSMENT: The BMD measured at Forearm Radius 33% is 0.633 g/cm2 with a  T-score of -2.8. This patient is considered osteoporotic according to [facility]) criteria. Lumbar spine was not utilized due to advanced degenerative changes. Patient is not a candidate for FRAX due to osteoporotic measurements of hip and wrist. Site Region Measured Measured WHO Young Adult BMD Date       Age      Classification T-score DualFemur Neck Right 10/30/2016 83.7 Osteoporosis -2.6 0.678 g/cm2 DualFemur Neck Right 06/23/2014 81.4 Osteopenia -2.4 0.711 g/cm2 Right Forearm Radius 33% 10/30/2016 83.7 Osteoporosis -2.8 0.633 g/cm2 Right Forearm Radius 33% 06/23/2014 81.4 Osteoporosis -3.3 0.588 g/cm2 World Health Organization [facility] criteria for post-menopausal, Caucasian Women: Normal:       T-score at or above -1 SD Osteopenia:   T-score between -1 and -2.5 SD Osteoporosis: T-score at or below -2.5 SD RECOMMENDATIONS: [place] recommends that FDA-approved medical therapies be considered in postmenopausal women and men age 24 or older with a: 1. Hip or vertebral (clinical or morphometric) fracture. 2. T-score of < -2.5 at the spine or hip. 3. Ten-year fracture probability by FRAX of 3% or greater for hip fracture or 20% or greater for major osteoporotic fracture. All treatment decisions require clinical judgment and consideration of individual patient factors, including patient preferences, co-morbidities, previous drug use, risk factors not captured in the FRAX model (e.g. falls, vitamin D deficiency, increased bone turnover, interval significant decline in bone density) and possible under - or over-estimation of fracture risk by FRAX. All patients should ensure an adequate intake of dietary calcium (1200 mg/d) and vitamin D (800 IU daily) unless contraindicated. FOLLOW-UP: People with diagnosed cases of osteoporosis or at high risk for fracture should have regular bone mineral density tests. For patients eligible for Medicare, routine testing is allowed once every  2 years. The testing frequency can be increased to one year for patients who have rapidly progressing disease, those who are receiving or discontinuing medical therapy to restore bone mass, or have additional risk factors. I have reviewed this report, and agree with the above findings. [facility] Radiology Electronically Signed   By: Franki Cabot M.D.   On: 10/30/2016 10:52   Mm Digital Screening Bilateral  Result Date: 10/30/2016 CLINICAL DATA:  Screening. EXAM:  DIGITAL SCREENING BILATERAL MAMMOGRAM WITH CAD COMPARISON:  Previous exam(s). ACR Breast Density Category b: There are scattered areas of fibroglandular density. FINDINGS: There are no findings suspicious for malignancy. Images were processed with CAD. IMPRESSION: No mammographic evidence of malignancy. A result letter of this screening mammogram will be mailed directly to the patient. RECOMMENDATION: Screening mammogram in one year. (Code:SM-B-01Y) BI-RADS CATEGORY  1: Negative. Electronically Signed   By: Pamelia Hoit M.D.   On: 10/30/2016 18:00       Assessment & Plan:   Problem List Items Addressed This Visit    Anemia    Follow cbc.       Relevant Orders   CBC with Differential/Platelet   Atrial fibrillation ([place])    On eliquis.  Increased ventricular rate today.  She has not noticed any chest pain, increased heart rate or palpitations.  EKG - afib with ventricular rate 120.  Discussed with Dr Clayborn Bigness.  Will start metoprolol.  Continue eliquis.  F/u appt with Dr Clayborn Bigness 11/04/17.        Relevant Medications   metoprolol tartrate (LOPRESSOR) 25 MG tablet   Other Relevant Orders   EKG 12-Lead (Completed)   TSH   B12 deficiency    Continue B12 injections.       Bronchiectasis without acute exacerbation ([place])    Followed by Dr Mortimer Fries.  Breathing stable.        Health care maintenance - Primary    Physical today 10/30/17.  Mammogram 10/30/16 - birads I.  Colonoscopy 2011.        Osteoporosis    Received reclast.  Last  infusion 12/2016.        Relevant Orders   VITAMIN D 25 Hydroxy (Vit-D Deficiency, Fractures)   Basic metabolic panel   Urinalysis, Routine w reflex microscopic       Einar Pheasant, MD

## 2017-11-02 ENCOUNTER — Encounter: Payer: Self-pay | Admitting: Internal Medicine

## 2017-11-02 NOTE — Assessment & Plan Note (Signed)
Continue B12 injections.   

## 2017-11-02 NOTE — Assessment & Plan Note (Signed)
Followed by Dr Kasa.  Breathing stable.  

## 2017-11-02 NOTE — Assessment & Plan Note (Signed)
Follow cbc.  

## 2017-11-02 NOTE — Assessment & Plan Note (Signed)
On eliquis.  Increased ventricular rate today.  She has not noticed any chest pain, increased heart rate or palpitations.  EKG - afib with ventricular rate 120.  Discussed with Dr Clayborn Bigness.  Will start metoprolol.  Continue eliquis.  F/u appt with Dr Clayborn Bigness 11/04/17.

## 2017-11-02 NOTE — Assessment & Plan Note (Signed)
Received reclast.  Last infusion 12/2016.

## 2017-11-05 DIAGNOSIS — M7061 Trochanteric bursitis, right hip: Secondary | ICD-10-CM | POA: Diagnosis not present

## 2017-11-10 DIAGNOSIS — S80861A Insect bite (nonvenomous), right lower leg, initial encounter: Secondary | ICD-10-CM | POA: Diagnosis not present

## 2017-11-10 DIAGNOSIS — S70362A Insect bite (nonvenomous), left thigh, initial encounter: Secondary | ICD-10-CM | POA: Diagnosis not present

## 2017-11-10 DIAGNOSIS — S70361A Insect bite (nonvenomous), right thigh, initial encounter: Secondary | ICD-10-CM | POA: Diagnosis not present

## 2017-11-10 DIAGNOSIS — S80862A Insect bite (nonvenomous), left lower leg, initial encounter: Secondary | ICD-10-CM | POA: Diagnosis not present

## 2017-11-27 ENCOUNTER — Other Ambulatory Visit (INDEPENDENT_AMBULATORY_CARE_PROVIDER_SITE_OTHER): Payer: Medicare Other

## 2017-11-27 DIAGNOSIS — I480 Paroxysmal atrial fibrillation: Secondary | ICD-10-CM | POA: Diagnosis not present

## 2017-11-27 DIAGNOSIS — D649 Anemia, unspecified: Secondary | ICD-10-CM

## 2017-11-27 DIAGNOSIS — M810 Age-related osteoporosis without current pathological fracture: Secondary | ICD-10-CM | POA: Diagnosis not present

## 2017-11-27 LAB — TSH: TSH: 6.47 u[IU]/mL — ABNORMAL HIGH (ref 0.35–4.50)

## 2017-11-27 LAB — CBC WITH DIFFERENTIAL/PLATELET
BASOS PCT: 0.9 % (ref 0.0–3.0)
Basophils Absolute: 0.1 10*3/uL (ref 0.0–0.1)
EOS ABS: 0.1 10*3/uL (ref 0.0–0.7)
EOS PCT: 1 % (ref 0.0–5.0)
HCT: 45 % (ref 36.0–46.0)
HEMOGLOBIN: 14.9 g/dL (ref 12.0–15.0)
Lymphocytes Relative: 32 % (ref 12.0–46.0)
Lymphs Abs: 2.3 10*3/uL (ref 0.7–4.0)
MCHC: 33.1 g/dL (ref 30.0–36.0)
MCV: 88.7 fl (ref 78.0–100.0)
MONO ABS: 0.5 10*3/uL (ref 0.1–1.0)
Monocytes Relative: 7.4 % (ref 3.0–12.0)
Neutro Abs: 4.2 10*3/uL (ref 1.4–7.7)
Neutrophils Relative %: 58.7 % (ref 43.0–77.0)
PLATELETS: 206 10*3/uL (ref 150.0–400.0)
RBC: 5.08 Mil/uL (ref 3.87–5.11)
RDW: 15.1 % (ref 11.5–15.5)
WBC: 7.2 10*3/uL (ref 4.0–10.5)

## 2017-11-27 LAB — BASIC METABOLIC PANEL
BUN: 17 mg/dL (ref 6–23)
CHLORIDE: 107 meq/L (ref 96–112)
CO2: 26 mEq/L (ref 19–32)
CREATININE: 0.94 mg/dL (ref 0.40–1.20)
Calcium: 9.1 mg/dL (ref 8.4–10.5)
GFR: 60.18 mL/min (ref >60.00)
GLUCOSE: 91 mg/dL (ref 70–99)
POTASSIUM: 4.5 meq/L (ref 3.5–5.1)
Sodium: 140 mEq/L (ref 135–145)

## 2017-11-27 LAB — VITAMIN D 25 HYDROXY (VIT D DEFICIENCY, FRACTURES): VITD: 45.56 ng/mL (ref 30.00–100.00)

## 2017-12-03 DIAGNOSIS — I208 Other forms of angina pectoris: Secondary | ICD-10-CM | POA: Diagnosis not present

## 2017-12-03 DIAGNOSIS — R079 Chest pain, unspecified: Secondary | ICD-10-CM | POA: Diagnosis not present

## 2017-12-03 DIAGNOSIS — R011 Cardiac murmur, unspecified: Secondary | ICD-10-CM | POA: Diagnosis not present

## 2017-12-03 DIAGNOSIS — R000 Tachycardia, unspecified: Secondary | ICD-10-CM | POA: Diagnosis not present

## 2017-12-23 DIAGNOSIS — H524 Presbyopia: Secondary | ICD-10-CM | POA: Diagnosis not present

## 2017-12-23 DIAGNOSIS — H40153 Residual stage of open-angle glaucoma, bilateral: Secondary | ICD-10-CM | POA: Diagnosis not present

## 2017-12-24 ENCOUNTER — Ambulatory Visit: Payer: Medicare Other | Admitting: Internal Medicine

## 2017-12-24 DIAGNOSIS — F329 Major depressive disorder, single episode, unspecified: Secondary | ICD-10-CM

## 2017-12-24 DIAGNOSIS — F32A Depression, unspecified: Secondary | ICD-10-CM

## 2017-12-24 DIAGNOSIS — I480 Paroxysmal atrial fibrillation: Secondary | ICD-10-CM | POA: Diagnosis not present

## 2017-12-24 DIAGNOSIS — D649 Anemia, unspecified: Secondary | ICD-10-CM

## 2017-12-24 DIAGNOSIS — J479 Bronchiectasis, uncomplicated: Secondary | ICD-10-CM

## 2017-12-24 DIAGNOSIS — L749 Eccrine sweat disorder, unspecified: Secondary | ICD-10-CM

## 2017-12-24 NOTE — Progress Notes (Signed)
Patient ID: Bridget Briggs, female   DOB: 03-28-1931, 84 y.o.   MRN: [id number]   Subjective:    Patient ID: Bridget Briggs, female    DOB: 1930/06/07, 84 y.o.   MRN: [id number]  HPI  Patient here for a scheduled follow up.  She reports she is doing relatively well.  Sees Dr Clayborn Bigness.  States she has been noticing some increased sweating. States the only time she notices is when she is active/exerting herself.  No chest pain.  No sob.  No acid reflux.  No abdominal pain.  Bowels moving.  No urine change.  No syncope or near syncope.  On metoprolol.  Other than the sweating she has noticed, she otherwise feels she is doing relatively well.     Past Medical History:  Diagnosis Date  . Arthritis   . Depression   . GERD (gastroesophageal reflux disease)   . Glaucoma   . History of chicken pox   . History of colon polyps   . Hx: UTI (urinary tract infection)    Past Surgical History:  Procedure Laterality Date  . ABDOMINAL HYSTERECTOMY  1974   left ovary removed  . ANKLE SURGERY Left 1998  . APPENDECTOMY  1974  . BREAST BIOPSY Right 1988   EXCISIONAL - NEG  . BREAST SURGERY Right 1988   biopsy  . CARDIAC CATHETERIZATION N/A 09/06/2015   Procedure: Right/Left Heart Cath and Coronary Angiography;  Surgeon: Yolonda Kida, MD;  Location: [facility];  Service: Cardiovascular;  Laterality: N/A;  . CHOLECYSTECTOMY  1996  . EYE SURGERY Bilateral 2005/2006   cataract  . LAPAROSCOPIC GASTRIC BANDING WITH HIATAL HERNIA REPAIR  2000  . NISSEN FUNDOPLICATION  9509  . TONSILLECTOMY AND ADENOIDECTOMY  1940   Family History  Problem Relation Age of Onset  . Arthritis Mother   . Leukemia Mother   . Lung cancer Father   . Osteoporosis Other   . Breast cancer Neg Hx    Social History   Socioeconomic History  . Marital status: Married    Spouse name: Not on file  . Number of children: Not on file  . Years of education: Not on file  . Highest education level: Not on file    Occupational History  . Not on file  Social Needs  . Financial resource strain: Not on file  . Food insecurity:    Worry: Never true    Inability: Never true  . Transportation needs:    Medical: No    Non-medical: No  Tobacco Use  . Smoking status: Former Smoker    Packs/day: 1.00    Years: 30.00    Pack years: 30.00    Types: Cigarettes    Last attempt to quit: 05/20/1988    Years since quitting: 29.6  . Smokeless tobacco: Never Used  Substance and Sexual Activity  . Alcohol use: No    Alcohol/week: 0.0 standard drinks  . Drug use: No  . Sexual activity: Never  Lifestyle  . Physical activity:    Days per week: Not on file    Minutes per session: Not on file  . Stress: Not on file  Relationships  . Social connections:    Talks on phone: Patient refused    Gets together: Patient refused    Attends religious service: Patient refused    Active member of club or organization: Patient refused    Attends meetings of clubs or organizations: Patient refused    Relationship status:  Patient refused  Other Topics Concern  . Not on file  Social History Narrative  . Not on file    Outpatient Encounter Medications as of 12/24/2017  Medication Sig  . apixaban (ELIQUIS) 5 MG TABS tablet Take 1 tablet (5 mg total) by mouth 2 (two) times daily.  Marland Kitchen Apoaequorin (PREVAGEN PO) Take 1 tablet by mouth daily.  . beta carotene w/minerals (OCUVITE) tablet Take 1 tablet by mouth daily.  . calcium-vitamin D (OSCAL WITH D) 500-200 MG-UNIT per tablet Take 1 tablet by mouth daily.  . diphenhydramine-acetaminophen (TYLENOL PM) 25-500 MG TABS Take 1 tablet by mouth at bedtime as needed.  . dorzolamide-timolol (COSOPT) 22.3-6.8 MG/ML ophthalmic solution Place 1 drop into both eyes 2 (two) times daily.  . fluocinolone (SYNALAR) 0.01 % external solution Apply topically 2 (two) times daily as needed.  . latanoprost (XALATAN) 0.005 % ophthalmic solution Place 1 drop into both eyes at bedtime.  .  metoprolol tartrate (LOPRESSOR) 25 MG tablet Take 1 tablet (25 mg total) by mouth 2 (two) times daily.  . vitamin D, CHOLECALCIFEROL, 400 UNITS tablet Take 400 Units by mouth daily.   No facility-administered encounter medications on file as of 12/24/2017.     Review of Systems  Constitutional: Negative for appetite change and unexpected weight change.  HENT: Negative for congestion and sinus pressure.   Respiratory: Negative for cough, chest tightness and shortness of breath.   Cardiovascular: Negative for chest pain, palpitations and leg swelling.  Gastrointestinal: Negative for abdominal pain, diarrhea, nausea and vomiting.  Genitourinary: Negative for difficulty urinating and dysuria.  Musculoskeletal: Negative for joint swelling and myalgias.  Skin: Negative for color change and rash.  Neurological: Negative for dizziness, light-headedness and headaches.  Psychiatric/Behavioral: Negative for agitation and dysphoric mood.       Objective:    Physical Exam  Constitutional: She appears well-developed and well-nourished. No distress.  HENT:  Nose: Nose normal.  Mouth/Throat: Oropharynx is clear and moist.  Neck: Neck supple. No thyromegaly present.  Cardiovascular: Normal rate.  Rate controlled.   Pulmonary/Chest: Breath sounds normal. No respiratory distress. She has no wheezes.  Abdominal: Soft. Bowel sounds are normal. There is no tenderness.  Musculoskeletal: She exhibits no edema or tenderness.  Lymphadenopathy:    She has no cervical adenopathy.  Skin: No rash noted. No erythema.  Psychiatric: She has a normal mood and affect. Her behavior is normal.    BP 124/72 (BP Location: Left Arm, Patient Position: Sitting, Cuff Size: Large)   Pulse 81   Temp 98.1 F (36.7 C) (Oral)   Resp 18   Wt 182 lb (82.6 kg)   SpO2 97%   BMI 31.73 kg/m  Wt Readings from Last 3 Encounters:  12/24/17 182 lb (82.6 kg)  10/30/17 184 lb (83.5 kg)  07/03/17 183 lb 9.6 oz (83.3 kg)      Lab Results  Component Value Date   WBC 7.2 11/27/2017   HGB 14.9 11/27/2017   HCT 45.0 11/27/2017   PLT 206.0 11/27/2017   GLUCOSE 91 11/27/2017   CHOL 204 (H) 10/28/2017   TRIG 138.0 10/28/2017   HDL 59.00 10/28/2017   LDLCALC 117 (H) 10/28/2017   ALT 15 10/28/2017   AST 20 10/28/2017   NA 140 11/27/2017   K 4.5 11/27/2017   CL 107 11/27/2017   CREATININE 0.94 11/27/2017   BUN 17 11/27/2017   CO2 26 11/27/2017   TSH 6.47 (H) 11/27/2017    Dg Bone Density  Result  Date: 10/30/2016 EXAM: DUAL X-RAY ABSORPTIOMETRY (DXA) FOR BONE MINERAL DENSITY IMPRESSION: Dear Dr. Einar Pheasant, Your patient Jossette Zirbel completed a BMD test on 10/30/2016 using the [employer] (analysis version: 14.10) manufactured by [employer]. The following summarizes the results of our evaluation. PATIENT BIOGRAPHICAL: Name: Wrenn, Willcox Patient ID: [id number] Birth Date: August 07, 1930 Height: 63.0 in. Gender: Female Exam Date: 10/30/2016 Weight: 175.4 lbs. Indications: Advanced Age, Caucasian, glaucoma, Height Loss, History of Fracture (Adult), Hysterectomy, Oopherectomy Unilateral, Postmenopausal, Previous Smoker Fractures: Left ankle, Left wrist Treatments: CALCIUM VIT D, eliquis ASSESSMENT: The BMD measured at Forearm Radius 33% is 0.633 g/cm2 with a T-score of -2.8. This patient is considered osteoporotic according to [facility]) criteria. Lumbar spine was not utilized due to advanced degenerative changes. Patient is not a candidate for FRAX due to osteoporotic measurements of hip and wrist. Site Region Measured Measured WHO Young Adult BMD Date       Age      Classification T-score DualFemur Neck Right 10/30/2016 83.7 Osteoporosis -2.6 0.678 g/cm2 DualFemur Neck Right 06/23/2014 81.4 Osteopenia -2.4 0.711 g/cm2 Right Forearm Radius 33% 10/30/2016 83.7 Osteoporosis -2.8 0.633 g/cm2 Right Forearm Radius 33% 06/23/2014 81.4 Osteoporosis -3.3 0.588 g/cm2 World Health  Organization [facility]) criteria for post-menopausal, Caucasian Women: Normal:       T-score at or above -1 SD Osteopenia:   T-score between -1 and -2.5 SD Osteoporosis: T-score at or below -2.5 SD RECOMMENDATIONS: [place] recommends that FDA-approved medical therapies be considered in postmenopausal women and men age 75 or older with a: 1. Hip or vertebral (clinical or morphometric) fracture. 2. T-score of < -2.5 at the spine or hip. 3. Ten-year fracture probability by FRAX of 3% or greater for hip fracture or 20% or greater for major osteoporotic fracture. All treatment decisions require clinical judgment and consideration of individual patient factors, including patient preferences, co-morbidities, previous drug use, risk factors not captured in the FRAX model (e.g. falls, vitamin D deficiency, increased bone turnover, interval significant decline in bone density) and possible under - or over-estimation of fracture risk by FRAX. All patients should ensure an adequate intake of dietary calcium (1200 mg/d) and vitamin D (800 IU daily) unless contraindicated. FOLLOW-UP: People with diagnosed cases of osteoporosis or at high risk for fracture should have regular bone mineral density tests. For patients eligible for Medicare, routine testing is allowed once every 2 years. The testing frequency can be increased to one year for patients who have rapidly progressing disease, those who are receiving or discontinuing medical therapy to restore bone mass, or have additional risk factors. I have reviewed this report, and agree with the above findings. [facility] Electronically Signed   By: Franki Cabot M.D.   On: 10/30/2016 10:52   Mm Digital Screening Bilateral  Result Date: 10/30/2016 CLINICAL DATA:  Screening. EXAM: DIGITAL SCREENING BILATERAL MAMMOGRAM WITH CAD COMPARISON:  Previous exam(s). ACR Breast Density Category b: There are scattered areas of fibroglandular density. FINDINGS:  There are no findings suspicious for malignancy. Images were processed with CAD. IMPRESSION: No mammographic evidence of malignancy. A result letter of this screening mammogram will be mailed directly to the patient. RECOMMENDATION: Screening mammogram in one year. (Code:SM-B-01Y) BI-RADS CATEGORY  1: Negative. Electronically Signed   By: Pamelia Hoit M.D.   On: 10/30/2016 18:00       Assessment & Plan:   Problem List Items Addressed This Visit    Anemia    Follow cbc.  Atrial fibrillation (HCC)    Heart rate controlled.  On eliquis. Followed by Dr Clayborn Bigness.  With increased sweating as outlined.  Only notices with activity/exertion.  Discussed with her today.  Request referral back to Dr Clayborn Bigness for further evaluation to confirm no cardiac etiology.        Relevant Orders   TSH   Lipid panel   Hepatic function panel   Basic metabolic panel   Bronchiectasis without acute exacerbation ([place])    Followed by Dr Mortimer Fries.  Breathing stable.        Depression    Stable.  Follow.        Sweating abnormality    Sweating as outlined.  Only occurs with increased activity/exertion.  Sees Dr Clayborn Bigness.  Since only occurs with activity, will have Dr Clayborn Bigness reevaluate to confirm no cardiac reason.            Einar Pheasant, MD

## 2017-12-28 ENCOUNTER — Encounter: Payer: Self-pay | Admitting: Internal Medicine

## 2017-12-28 DIAGNOSIS — L749 Eccrine sweat disorder, unspecified: Secondary | ICD-10-CM | POA: Insufficient documentation

## 2017-12-28 NOTE — Assessment & Plan Note (Signed)
Follow cbc.  

## 2017-12-28 NOTE — Assessment & Plan Note (Signed)
Sweating as outlined.  Only occurs with increased activity/exertion.  Sees Dr Clayborn Bigness.  Since only occurs with activity, will have Dr Clayborn Bigness reevaluate to confirm no cardiac reason.

## 2017-12-28 NOTE — Assessment & Plan Note (Signed)
Followed by Dr Kasa.  Breathing stable.  

## 2017-12-28 NOTE — Assessment & Plan Note (Signed)
Heart rate controlled.  On eliquis. Followed by Dr Clayborn Bigness.  With increased sweating as outlined.  Only notices with activity/exertion.  Discussed with her today.  Request referral back to Dr Clayborn Bigness for further evaluation to confirm no cardiac etiology.

## 2017-12-28 NOTE — Assessment & Plan Note (Signed)
Stable.  Follow.   

## 2017-12-29 DIAGNOSIS — R7989 Other specified abnormal findings of blood chemistry: Secondary | ICD-10-CM | POA: Diagnosis not present

## 2017-12-29 DIAGNOSIS — I4891 Unspecified atrial fibrillation: Secondary | ICD-10-CM | POA: Diagnosis not present

## 2017-12-29 DIAGNOSIS — I208 Other forms of angina pectoris: Secondary | ICD-10-CM | POA: Diagnosis not present

## 2017-12-29 DIAGNOSIS — R000 Tachycardia, unspecified: Secondary | ICD-10-CM | POA: Diagnosis not present

## 2017-12-31 ENCOUNTER — Other Ambulatory Visit: Payer: Self-pay | Admitting: Internal Medicine

## 2017-12-31 DIAGNOSIS — Z1231 Encounter for screening mammogram for malignant neoplasm of breast: Secondary | ICD-10-CM

## 2018-01-14 DIAGNOSIS — R7989 Other specified abnormal findings of blood chemistry: Secondary | ICD-10-CM | POA: Diagnosis not present

## 2018-01-14 DIAGNOSIS — M810 Age-related osteoporosis without current pathological fracture: Secondary | ICD-10-CM | POA: Diagnosis not present

## 2018-01-15 ENCOUNTER — Ambulatory Visit
Admission: RE | Admit: 2018-01-15 | Discharge: 2018-01-15 | Disposition: A | Discharge status: Home / Self Care | Payer: Medicare Other | Source: Ambulatory Visit | Attending: Internal Medicine | Admitting: Internal Medicine

## 2018-01-15 DIAGNOSIS — Z1231 Encounter for screening mammogram for malignant neoplasm of breast: Secondary | ICD-10-CM | POA: Insufficient documentation

## 2018-01-22 DIAGNOSIS — I4891 Unspecified atrial fibrillation: Secondary | ICD-10-CM | POA: Diagnosis not present

## 2018-01-22 DIAGNOSIS — I208 Other forms of angina pectoris: Secondary | ICD-10-CM | POA: Diagnosis not present

## 2018-01-28 DIAGNOSIS — I4891 Unspecified atrial fibrillation: Secondary | ICD-10-CM | POA: Diagnosis not present

## 2018-01-28 DIAGNOSIS — I208 Other forms of angina pectoris: Secondary | ICD-10-CM | POA: Diagnosis not present

## 2018-01-28 DIAGNOSIS — R000 Tachycardia, unspecified: Secondary | ICD-10-CM | POA: Diagnosis not present

## 2018-01-28 DIAGNOSIS — R7989 Other specified abnormal findings of blood chemistry: Secondary | ICD-10-CM | POA: Diagnosis not present

## 2018-02-13 ENCOUNTER — Ambulatory Visit (INDEPENDENT_AMBULATORY_CARE_PROVIDER_SITE_OTHER): Payer: Medicare Other

## 2018-02-13 DIAGNOSIS — Z23 Encounter for immunization: Secondary | ICD-10-CM

## 2018-03-03 ENCOUNTER — Other Ambulatory Visit (INDEPENDENT_AMBULATORY_CARE_PROVIDER_SITE_OTHER): Payer: Medicare Other

## 2018-03-03 DIAGNOSIS — I480 Paroxysmal atrial fibrillation: Secondary | ICD-10-CM | POA: Diagnosis not present

## 2018-03-03 LAB — BASIC METABOLIC PANEL
BUN: 14 mg/dL (ref 6–23)
CALCIUM: 9.4 mg/dL (ref 8.4–10.5)
CO2: 26 mEq/L (ref 19–32)
Chloride: 107 mEq/L (ref 96–112)
Creatinine, Ser: 0.88 mg/dL (ref 0.40–1.20)
GFR: 64.9 mL/min (ref >60.00)
GLUCOSE: 101 mg/dL — AB (ref 70–99)
Potassium: 4.1 mEq/L (ref 3.5–5.1)
Sodium: 139 mEq/L (ref 135–145)

## 2018-03-03 LAB — HEPATIC FUNCTION PANEL
ALK PHOS: 45 U/L (ref 39–117)
ALT: 15 U/L (ref 0–35)
AST: 17 U/L (ref 0–37)
Albumin: 4.2 g/dL (ref 3.5–5.2)
BILIRUBIN DIRECT: 0.2 mg/dL (ref 0.0–0.3)
BILIRUBIN TOTAL: 0.8 mg/dL (ref 0.2–1.2)
TOTAL PROTEIN: 6.9 g/dL (ref 6.0–8.3)

## 2018-03-03 LAB — LIPID PANEL
CHOL/HDL RATIO: 3
Cholesterol: 185 mg/dL (ref 0–200)
HDL: 58.4 mg/dL (ref >39.00)
LDL CALC: 98 mg/dL (ref 0–99)
NONHDL: 126.2
Triglycerides: 140 mg/dL (ref 0.0–149.0)
VLDL: 28 mg/dL (ref 0.0–40.0)

## 2018-03-03 LAB — TSH: TSH: 6.66 u[IU]/mL — ABNORMAL HIGH (ref 0.35–4.50)

## 2018-03-05 ENCOUNTER — Ambulatory Visit: Payer: Medicare Other | Admitting: Internal Medicine

## 2018-03-11 DIAGNOSIS — R946 Abnormal results of thyroid function studies: Secondary | ICD-10-CM | POA: Diagnosis not present

## 2018-03-11 DIAGNOSIS — R7989 Other specified abnormal findings of blood chemistry: Secondary | ICD-10-CM | POA: Diagnosis not present

## 2018-03-12 ENCOUNTER — Ambulatory Visit: Payer: Medicare Other | Admitting: Internal Medicine

## 2018-03-12 ENCOUNTER — Encounter: Payer: Self-pay | Admitting: Internal Medicine

## 2018-03-12 VITALS — BP 114/60 | HR 59 | Temp 98.1°F | Resp 18 | Wt 188.0 lb

## 2018-03-12 DIAGNOSIS — E538 Deficiency of other specified B group vitamins: Secondary | ICD-10-CM

## 2018-03-12 DIAGNOSIS — M25551 Pain in right hip: Secondary | ICD-10-CM

## 2018-03-12 DIAGNOSIS — L749 Eccrine sweat disorder, unspecified: Secondary | ICD-10-CM

## 2018-03-12 DIAGNOSIS — J479 Bronchiectasis, uncomplicated: Secondary | ICD-10-CM

## 2018-03-12 DIAGNOSIS — D649 Anemia, unspecified: Secondary | ICD-10-CM | POA: Diagnosis not present

## 2018-03-12 DIAGNOSIS — I480 Paroxysmal atrial fibrillation: Secondary | ICD-10-CM

## 2018-03-12 DIAGNOSIS — R918 Other nonspecific abnormal finding of lung field: Secondary | ICD-10-CM

## 2018-03-12 DIAGNOSIS — I700 Atherosclerosis of aorta: Secondary | ICD-10-CM

## 2018-03-12 NOTE — Progress Notes (Signed)
Patient ID: Bridget Briggs, female   DOB: 07/24/1930, 84 y.o.   MRN: [id number]   Subjective:    Patient ID: Bridget Briggs, female    DOB: Apr 21, 1931, 84 y.o.   MRN: [id number]  HPI  Patient here for a scheduled follow up.  She reports she is doing well.  Feels good.  Tries to stay active.  No chest pain.  No sob.  No acid reflux.  No abdominal pain. Bowels moving.  Having some pain in her right hip and right leg.  Previously saw ortho.  S/p injection.  Helped.  Starting to have increased pain.  Requested referral back for evaluation and question of need for another injection.     Past Medical History:  Diagnosis Date  . Arthritis   . Depression   . GERD (gastroesophageal reflux disease)   . Glaucoma   . History of chicken pox   . History of colon polyps   . Hx: UTI (urinary tract infection)    Past Surgical History:  Procedure Laterality Date  . ABDOMINAL HYSTERECTOMY  1974   left ovary removed  . ANKLE SURGERY Left 1998  . APPENDECTOMY  1974  . BREAST BIOPSY Right 1988   NEG  . BREAST SURGERY Right 1988   biopsy  . CARDIAC CATHETERIZATION N/A 09/06/2015   Procedure: Right/Left Heart Cath and Coronary Angiography;  Surgeon: Yolonda Kida, MD;  Location: [facility];  Service: Cardiovascular;  Laterality: N/A;  . CHOLECYSTECTOMY  1996  . EYE SURGERY Bilateral 2005/2006   cataract  . LAPAROSCOPIC GASTRIC BANDING WITH HIATAL HERNIA REPAIR  2000  . NISSEN FUNDOPLICATION  7829  . OOPHORECTOMY Left   . TONSILLECTOMY AND ADENOIDECTOMY  1940   Family History  Problem Relation Age of Onset  . Arthritis Mother   . Leukemia Mother   . Lung cancer Father   . Osteoporosis Other   . Breast cancer Neg Hx    Social History   Socioeconomic History  . Marital status: Married    Spouse name: Not on file  . Number of children: Not on file  . Years of education: Not on file  . Highest education level: Not on file  Occupational History  . Not on file  Social Needs    . Financial resource strain: Not on file  . Food insecurity:    Worry: Never true    Inability: Never true  . Transportation needs:    Medical: No    Non-medical: No  Tobacco Use  . Smoking status: Former Smoker    Packs/day: 1.00    Years: 30.00    Pack years: 30.00    Types: Cigarettes    Last attempt to quit: 05/20/1988    Years since quitting: 29.8  . Smokeless tobacco: Never Used  Substance and Sexual Activity  . Alcohol use: No    Alcohol/week: 0.0 standard drinks  . Drug use: No  . Sexual activity: Never  Lifestyle  . Physical activity:    Days per week: Not on file    Minutes per session: Not on file  . Stress: Not on file  Relationships  . Social connections:    Talks on phone: Patient refused    Gets together: Patient refused    Attends religious service: Patient refused    Active member of club or organization: Patient refused    Attends meetings of clubs or organizations: Patient refused    Relationship status: Patient refused  Other Topics Concern  .  Not on file  Social History Narrative  . Not on file    Outpatient Encounter Medications as of 03/12/2018  Medication Sig  . apixaban (ELIQUIS) 5 MG TABS tablet Take 1 tablet (5 mg total) by mouth 2 (two) times daily.  Marland Kitchen Apoaequorin (PREVAGEN PO) Take 1 tablet by mouth daily.  . beta carotene w/minerals (OCUVITE) tablet Take 1 tablet by mouth daily.  . calcium-vitamin D (OSCAL WITH D) 500-200 MG-UNIT per tablet Take 1 tablet by mouth daily.  . diphenhydramine-acetaminophen (TYLENOL PM) 25-500 MG TABS Take 1 tablet by mouth at bedtime as needed.  . dorzolamide-timolol (COSOPT) 22.3-6.8 MG/ML ophthalmic solution Place 1 drop into both eyes 2 (two) times daily.  . fluocinolone (SYNALAR) 0.01 % external solution Apply topically 2 (two) times daily as needed.  . latanoprost (XALATAN) 0.005 % ophthalmic solution Place 1 drop into both eyes at bedtime.  . metoprolol tartrate (LOPRESSOR) 25 MG tablet Take 1 tablet  (25 mg total) by mouth 2 (two) times daily.  . vitamin D, CHOLECALCIFEROL, 400 UNITS tablet Take 400 Units by mouth daily.   No facility-administered encounter medications on file as of 03/12/2018.     Review of Systems  Constitutional: Negative for appetite change and unexpected weight change.  HENT: Negative for congestion and sinus pressure.   Respiratory: Negative for cough, chest tightness and shortness of breath.   Cardiovascular: Negative for chest pain, palpitations and leg swelling.  Gastrointestinal: Negative for abdominal pain, diarrhea, nausea and vomiting.  Genitourinary: Negative for difficulty urinating and dysuria.  Musculoskeletal: Negative for joint swelling and myalgias.       Right hip and leg pain as outlined.    Skin: Negative for color change and rash.  Neurological: Negative for dizziness, light-headedness and headaches.  Psychiatric/Behavioral: Negative for agitation and dysphoric mood.       Objective:    Physical Exam  Constitutional: She appears well-developed and well-nourished. No distress.  HENT:  Nose: Nose normal.  Mouth/Throat: Oropharynx is clear and moist.  Neck: Neck supple. No thyromegaly present.  Cardiovascular: Normal rate and regular rhythm.  Pulmonary/Chest: Breath sounds normal. No respiratory distress. She has no wheezes.  Abdominal: Soft. Bowel sounds are normal. There is no tenderness.  Musculoskeletal: She exhibits no edema or tenderness.  Lymphadenopathy:    She has no cervical adenopathy.  Skin: No rash noted. No erythema.  Psychiatric: She has a normal mood and affect. Her behavior is normal.    BP 114/60 (BP Location: Left Arm, Patient Position: Sitting, Cuff Size: Normal)   Pulse (!) 59   Temp 98.1 F (36.7 C) (Oral)   Resp 18   Wt 188 lb (85.3 kg)   SpO2 97%   BMI 32.78 kg/m  Wt Readings from Last 3 Encounters:  03/12/18 188 lb (85.3 kg)  12/24/17 182 lb (82.6 kg)  10/30/17 184 lb (83.5 kg)     Lab Results    Component Value Date   WBC 7.2 11/27/2017   HGB 14.9 11/27/2017   HCT 45.0 11/27/2017   PLT 206.0 11/27/2017   GLUCOSE 101 (H) 03/03/2018   CHOL 185 03/03/2018   TRIG 140.0 03/03/2018   HDL 58.40 03/03/2018   LDLCALC 98 03/03/2018   ALT 15 03/03/2018   AST 17 03/03/2018   NA 139 03/03/2018   K 4.1 03/03/2018   CL 107 03/03/2018   CREATININE 0.88 03/03/2018   BUN 14 03/03/2018   CO2 26 03/03/2018   TSH 6.66 (H) 03/03/2018  Mm 3d Screen Breast Bilateral  Result Date: 01/15/2018 CLINICAL DATA:  Screening. EXAM: DIGITAL SCREENING BILATERAL MAMMOGRAM WITH TOMO AND CAD COMPARISON:  Previous exam(s). ACR Breast Density Category a: The breast tissue is almost entirely fatty. FINDINGS: There are no findings suspicious for malignancy. Images were processed with CAD. IMPRESSION: No mammographic evidence of malignancy. A result letter of this screening mammogram will be mailed directly to the patient. RECOMMENDATION: Screening mammogram in one year. (Code:SM-B-01Y) BI-RADS CATEGORY  1: Negative. Electronically Signed   By: Kristopher Oppenheim M.D.   On: 01/15/2018 16:00       Assessment & Plan:   Problem List Items Addressed This Visit    Anemia    Follow cbc.       Aortic atherosclerosis (HCC)    Not on statin medication.        Atrial fibrillation ([place])    Followed by Dr Clayborn Bigness.  On eliquis.  Doing well.        B12 deficiency    Continue b12 injections.        Bronchiectasis without acute exacerbation ([place])    Followed by Dr Mortimer Fries.  Breathing stable.        Multiple pulmonary nodules    Followed by pulmonary.  Appears to be overdue f/u.  Was evaluated 11/2016.  Recommended 6 month f/u.  Had f/u 05/2017.  No changes.  Recommended no further CT scans.        Sweating abnormality    Undergoing w/up.  Endocrinology evaluating.  TSH on their check recently wnl.  Follow.  (slight elevation here, but just checked at [facility] and wnl).  Hold on making any changes.         Other Visit  Diagnoses    Right hip pain    -  Primary   Relevant Orders   Ambulatory referral to Orthopedic Surgery       Einar Pheasant, MD

## 2018-03-15 ENCOUNTER — Encounter: Payer: Self-pay | Admitting: Internal Medicine

## 2018-03-15 NOTE — Assessment & Plan Note (Signed)
Followed by Dr Kasa.  Breathing stable.  

## 2018-03-15 NOTE — Assessment & Plan Note (Signed)
Not on statin medication 

## 2018-03-15 NOTE — Assessment & Plan Note (Signed)
Undergoing w/up.  Endocrinology evaluating.  TSH on their check recently wnl.  Follow.  (slight elevation here, but just checked at [facility] and wnl).  Hold on making any changes.

## 2018-03-15 NOTE — Assessment & Plan Note (Signed)
Continue b12 injections.  

## 2018-03-15 NOTE — Assessment & Plan Note (Signed)
Followed by Dr Clayborn Bigness.  On eliquis.  Doing well.

## 2018-03-15 NOTE — Assessment & Plan Note (Signed)
Follow cbc.  

## 2018-03-15 NOTE — Assessment & Plan Note (Addendum)
Followed by pulmonary.  Appears to be overdue f/u.  Was evaluated 11/2016.  Recommended 6 month f/u.  Had f/u 05/2017.  No changes.  Recommended no further CT scans.

## 2018-03-18 DIAGNOSIS — R7989 Other specified abnormal findings of blood chemistry: Secondary | ICD-10-CM | POA: Diagnosis not present

## 2018-04-13 DIAGNOSIS — M7061 Trochanteric bursitis, right hip: Secondary | ICD-10-CM | POA: Diagnosis not present

## 2018-04-13 DIAGNOSIS — M25551 Pain in right hip: Secondary | ICD-10-CM | POA: Diagnosis not present

## 2018-05-07 ENCOUNTER — Ambulatory Visit (INDEPENDENT_AMBULATORY_CARE_PROVIDER_SITE_OTHER): Payer: Medicare Other

## 2018-05-07 VITALS — BP 120/72 | HR 77 | Temp 97.8°F | Resp 15 | Ht 63.75 in | Wt 185.8 lb

## 2018-05-07 DIAGNOSIS — Z0000 Encounter for general adult medical examination without abnormal findings: Secondary | ICD-10-CM

## 2018-05-07 DIAGNOSIS — H40153 Residual stage of open-angle glaucoma, bilateral: Secondary | ICD-10-CM | POA: Diagnosis not present

## 2018-05-07 NOTE — Patient Instructions (Addendum)
  Bridget Briggs , Thank you for taking time to come for your Medicare Wellness Visit. I appreciate your ongoing commitment to your health goals. Please review the following plan we discussed and let me know if I can assist you in the future.   These are the goals we discussed: Goals    . Maintain Heallthy Lifestyle      Stay active Continue playing bridge, cross word puzzles, reading etc... Stay hydrated Hydrated       This is a list of the screening recommended for you and due dates:  Health Maintenance  Topic Date Due  . Tetanus Vaccine  01/28/1952  . Mammogram  01/16/2019  . Flu Shot  Completed  . DEXA scan (bone density measurement)  Completed  . Pneumonia vaccines  Completed

## 2018-05-07 NOTE — Progress Notes (Signed)
Subjective:   Bridget Briggs is a 84 y.o. female who presents for Medicare Annual (Subsequent) preventive examination.  Review of Systems:  No ROS.  Medicare Wellness Visit. Additional risk factors are reflected in the social history. Cardiac Risk Factors include: advanced age (>50men, >57 women)     Objective:     Vitals: BP 120/72 (BP Location: Left Arm, Patient Position: Sitting, Cuff Size: Normal)   Pulse 77   Temp 97.8 F (36.6 C) (Oral)   Resp 15   Ht 5' 3.75" (1.619 m)   Wt 185 lb 12.8 oz (84.3 kg)   SpO2 95%   BMI 32.14 kg/m   Body mass index is 32.14 kg/m.  Advanced Directives 05/07/2018 05/06/2017 04/25/2016 09/06/2015 06/08/2015 04/26/2015 04/20/2015  Does Patient Have a Medical Advance Directive? Yes Yes Yes Yes Yes Yes Yes  Type of Paramedic of [place];Living will Living will [place];Living will - Living will [place];Living will [place];Living will  Does patient want to make changes to medical advance directive? No - Patient declined No - Patient declined - No - Patient declined No - Patient declined - -  Copy of [place] in Chart? Yes - validated most recent copy scanned in chart (See row information) - No - copy requested - No - copy requested No - copy requested -    Tobacco Social History   Tobacco Use  Smoking Status Former Smoker  . Packs/day: 1.00  . Years: 30.00  . Pack years: 30.00  . Types: Cigarettes  . Last attempt to quit: 05/20/1988  . Years since quitting: 29.9  Smokeless Tobacco Never Used     Counseling given: Not Answered   Clinical Intake:  Pre-visit preparation completed: Yes  Pain : No/denies pain     Diabetes: No  How often do you need to have someone help you when you read instructions, pamphlets, or other written materials from your doctor or pharmacy?: 1 - Never  Interpreter Needed?: No     Past Medical History:   Diagnosis Date  . Arthritis   . Depression   . GERD (gastroesophageal reflux disease)   . Glaucoma   . History of chicken pox   . History of colon polyps   . Hx: UTI (urinary tract infection)    Past Surgical History:  Procedure Laterality Date  . ABDOMINAL HYSTERECTOMY  1974   left ovary removed  . ANKLE SURGERY Left 1998  . APPENDECTOMY  1974  . BREAST BIOPSY Right 1988   NEG  . BREAST SURGERY Right 1988   biopsy  . CARDIAC CATHETERIZATION N/A 09/06/2015   Procedure: Right/Left Heart Cath and Coronary Angiography;  Surgeon: Yolonda Kida, MD;  Location: [facility];  Service: Cardiovascular;  Laterality: N/A;  . CHOLECYSTECTOMY  1996  . EYE SURGERY Bilateral 2005/2006   cataract  . LAPAROSCOPIC GASTRIC BANDING WITH HIATAL HERNIA REPAIR  2000  . NISSEN FUNDOPLICATION  0947  . OOPHORECTOMY Left   . TONSILLECTOMY AND ADENOIDECTOMY  1940   Family History  Problem Relation Age of Onset  . Arthritis Mother   . Leukemia Mother   . Lung cancer Father   . Osteoporosis Other   . Breast cancer Neg Hx    Social History   Socioeconomic History  . Marital status: Married    Spouse name: Not on file  . Number of children: Not on file  . Years of education:  Not on file  . Highest education level: Not on file  Occupational History  . Not on file  Social Needs  . Financial resource strain: Not hard at all  . Food insecurity:    Worry: Never true    Inability: Never true  . Transportation needs:    Medical: No    Non-medical: No  Tobacco Use  . Smoking status: Former Smoker    Packs/day: 1.00    Years: 30.00    Pack years: 30.00    Types: Cigarettes    Last attempt to quit: 05/20/1988    Years since quitting: 29.9  . Smokeless tobacco: Never Used  Substance and Sexual Activity  . Alcohol use: No    Alcohol/week: 0.0 standard drinks  . Drug use: No  . Sexual activity: Never  Lifestyle  . Physical activity:    Days per week: 3 days    Minutes per  session: 60 min  . Stress: Not at all  Relationships  . Social connections:    Talks on phone: Not on file    Gets together: Not on file    Attends religious service: Not on file    Active member of club or organization: Not on file    Attends meetings of clubs or organizations: Not on file    Relationship status: Widowed  Other Topics Concern  . Not on file  Social History Narrative  . Not on file    Outpatient Encounter Medications as of 05/07/2018  Medication Sig  . apixaban (ELIQUIS) 5 MG TABS tablet Take 1 tablet (5 mg total) by mouth 2 (two) times daily.  Marland Kitchen Apoaequorin (PREVAGEN PO) Take 1 tablet by mouth daily.  . beta carotene w/minerals (OCUVITE) tablet Take 1 tablet by mouth daily.  . calcium-vitamin D (OSCAL WITH D) 500-200 MG-UNIT per tablet Take 1 tablet by mouth daily.  . diphenhydramine-acetaminophen (TYLENOL PM) 25-500 MG TABS Take 1 tablet by mouth at bedtime as needed.  . dorzolamide-timolol (COSOPT) 22.3-6.8 MG/ML ophthalmic solution Place 1 drop into both eyes 2 (two) times daily.  . fluocinolone (SYNALAR) 0.01 % external solution Apply topically 2 (two) times daily as needed.  . latanoprost (XALATAN) 0.005 % ophthalmic solution Place 1 drop into both eyes at bedtime.  . metoprolol tartrate (LOPRESSOR) 25 MG tablet Take 1 tablet (25 mg total) by mouth 2 (two) times daily.  . vitamin D, CHOLECALCIFEROL, 400 UNITS tablet Take 400 Units by mouth daily.   No facility-administered encounter medications on file as of 05/07/2018.     Activities of Daily Living In your present state of health, do you have any difficulty performing the following activities: 05/07/2018  Hearing? N  Vision? N  Difficulty concentrating or making decisions? N  Walking or climbing stairs? N  Dressing or bathing? N  Doing errands, shopping? N  Preparing Food and eating ? N  Using the Toilet? N  In the past six months, have you accidently leaked urine? N  Do you have problems with loss  of bowel control? N  Managing your Medications? N  Managing your Finances? N  Housekeeping or managing your Housekeeping? N  Some recent data might be hidden    Patient Care Team: Einar Pheasant, MD as PCP - General (Internal Medicine)    Assessment:   This is a routine wellness examination for Bridget Briggs.  States she feels good and is looking forward to traveling to visit with family for the holidays.   Keep all routine scheduled appointments  as directed.   Health Screenings  Mammogram -01/15/18 Colonoscopy-2011 Bone Density -10/30/16, Osteoporosis Glaucoma yes; drops in use Hearing-passes the whisper test Glucose-03/03/18 (101) Cholesterol-03/03/18 (185) Dental-every 6 months Vision-05/07/2018 B12-08/28/15 (207)  Social  Alcohol intake-none Smoking history-none Smokers in home?former Illicit drug use?none Oncologist  Patient feels safe at home.  Patient does have smoke detectors at home  Patient does wear sunscreen or protective clothing when in direct sunlight. Patient does wear seat belt when driving or riding with others.   Activities of Daily Living Patient can do their own household chores. Denies needing assistance with: driving, feeding themselves, getting from bed to chair, getting to the toilet, bathing/showering, dressing, managing money, climbing flight of stairs, or preparing meals.   Depression Screen Patient denies losing interest in daily life, feeling hopeless, or crying easily over simple problems.   Fall Screen Patient denies being afraid of falling or falling in the last year.   Memory Screen Patient denies problems with memory, misplacing items, and is able to balance checkbook/bank accounts.  Patient is alert, normal appearance, oriented to person/place/and time. Correctly identified the president of the [place], recall of 3/3 objects, and performing simple calculations.  Patient displays appropriate judgement and can read  correct time from watch face.   Immunizations The following Immunizations are up to date: Influenza and  Pneumonia. TDAP and shingles discussed; deferred for follow up with pcp, per patient preference.  Other Providers Patient Care Team: Einar Pheasant, MD as PCP - General (Internal Medicine)   Exercise Activities and Dietary recommendations Current Exercise Habits: Home exercise routine, Type of exercise: walking, Time (Minutes): 60, Frequency (Times/Week): 3, Weekly Exercise (Minutes/Week): 180, Intensity: Mild  Goals    . Maintain Heallthy Lifestyle      Stay active Continue playing bridge, cross word puzzles, reading etc... Stay hydrated Hydrated       Fall Risk Fall Risk  05/07/2018 05/06/2017 10/28/2016 04/25/2016 02/08/2016  Falls in the past year? 0 No No No No   Depression Screen PHQ 2/9 Scores 05/07/2018 05/06/2017 10/28/2016 04/25/2016  PHQ - 2 Score 0 0 0 0     Cognitive Function MMSE - Mini Mental State Exam 05/06/2017 04/26/2015  Orientation to time 5 5  Orientation to Place 5 5  Registration 3 3  Attention/ Calculation 5 5  Recall 3 3  Language- name 2 objects 2 2  Language- repeat 1 1  Language- follow 3 step command 3 3  Language- read & follow direction 1 1  Write a sentence 1 1  Copy design 1 1  Total score 30 30     6CIT Screen 05/07/2018 04/25/2016  What Year? 0 points 0 points  What month? 0 points 0 points  What time? 0 points 0 points  Count back from 20 0 points 0 points  Months in reverse 0 points 0 points  Repeat phrase 0 points -  Total Score 0 -    Immunization History  Administered Date(s) Administered  . Influenza Split 02/22/2013  . Influenza, High Dose Seasonal PF 06/10/2016, 02/27/2017  . Influenza,inj,Quad PF,6+ Mos 01/20/2014, 03/02/2015, 02/13/2018  . Pneumococcal Conjugate-13 01/20/2014  . Pneumococcal Polysaccharide-23 09/12/2016   Screening Tests Health Maintenance  Topic Date Due  . TETANUS/TDAP  01/28/1950  .  MAMMOGRAM  01/16/2019  . INFLUENZA VACCINE  Completed  . DEXA SCAN  Completed  . PNA vac Low Risk Adult  Completed      Plan:   End of life planning; Advance  aging; Advanced directives discussed. Copy of current HCPOA/Living Will on file.   I have personally reviewed and noted the following in the patient's chart:   . Medical and social history . Use of alcohol, tobacco or illicit drugs  . Current medications and supplements . Functional ability and status . Nutritional status . Physical activity . Advanced directives . List of other physicians . Hospitalizations, surgeries, and ER visits in previous 12 months . Vitals . Screenings to include cognitive, depression, and falls . Referrals and appointments  In addition, I have reviewed and discussed with patient certain preventive protocols, quality metrics, and best practice recommendations. A written personalized care plan for preventive services as well as general preventive health recommendations were provided to patient.     Varney Biles, LPN  [id number]   Reviewed above information.  Agree with assessment and plan.    Dr Nicki Reaper

## 2018-05-08 ENCOUNTER — Telehealth: Payer: Self-pay

## 2018-05-08 ENCOUNTER — Ambulatory Visit: Payer: Medicare Other

## 2018-05-08 NOTE — Telephone Encounter (Signed)
Patient to return call and schedule a monthly Nurse visit (December and January) for a B12 injection, per primary care physician order.  Dr. Nicki Reaper will check B12 level during her office visit in February.   PEC please read message and schedule NURSE visit when the patient returns the call.

## 2018-05-14 ENCOUNTER — Ambulatory Visit: Payer: Medicare Other

## 2018-05-26 ENCOUNTER — Ambulatory Visit (INDEPENDENT_AMBULATORY_CARE_PROVIDER_SITE_OTHER): Payer: Medicare Other

## 2018-05-26 DIAGNOSIS — E538 Deficiency of other specified B group vitamins: Secondary | ICD-10-CM

## 2018-05-26 MED ORDER — CYANOCOBALAMIN 1000 MCG/ML IJ SOLN
1000.0000 ug | Freq: Once | INTRAMUSCULAR | Status: AC
  Administered 2018-05-26: 1000 ug via INTRAMUSCULAR

## 2018-05-26 NOTE — Progress Notes (Signed)
Pt was seen today for B-12 shot. Given Im in the LD. Pt tolerated well.

## 2018-05-28 DIAGNOSIS — H40153 Residual stage of open-angle glaucoma, bilateral: Secondary | ICD-10-CM | POA: Diagnosis not present

## 2018-06-30 ENCOUNTER — Encounter: Payer: Self-pay | Admitting: Internal Medicine

## 2018-06-30 ENCOUNTER — Ambulatory Visit (INDEPENDENT_AMBULATORY_CARE_PROVIDER_SITE_OTHER): Payer: Medicare Other

## 2018-06-30 ENCOUNTER — Ambulatory Visit: Payer: Medicare Other | Admitting: Internal Medicine

## 2018-06-30 VITALS — BP 117/80 | HR 105 | Temp 97.7°F | Wt 184.6 lb

## 2018-06-30 DIAGNOSIS — R05 Cough: Secondary | ICD-10-CM | POA: Diagnosis not present

## 2018-06-30 DIAGNOSIS — J479 Bronchiectasis, uncomplicated: Secondary | ICD-10-CM

## 2018-06-30 DIAGNOSIS — R053 Chronic cough: Secondary | ICD-10-CM

## 2018-06-30 DIAGNOSIS — I700 Atherosclerosis of aorta: Secondary | ICD-10-CM | POA: Diagnosis not present

## 2018-06-30 DIAGNOSIS — D649 Anemia, unspecified: Secondary | ICD-10-CM

## 2018-06-30 DIAGNOSIS — I480 Paroxysmal atrial fibrillation: Secondary | ICD-10-CM | POA: Diagnosis not present

## 2018-06-30 DIAGNOSIS — R7989 Other specified abnormal findings of blood chemistry: Secondary | ICD-10-CM

## 2018-06-30 DIAGNOSIS — E538 Deficiency of other specified B group vitamins: Secondary | ICD-10-CM

## 2018-06-30 DIAGNOSIS — R059 Cough, unspecified: Secondary | ICD-10-CM

## 2018-06-30 DIAGNOSIS — M25551 Pain in right hip: Secondary | ICD-10-CM

## 2018-06-30 DIAGNOSIS — K219 Gastro-esophageal reflux disease without esophagitis: Secondary | ICD-10-CM

## 2018-06-30 LAB — BASIC METABOLIC PANEL
BUN: 14 mg/dL (ref 6–23)
CALCIUM: 9.7 mg/dL (ref 8.4–10.5)
CO2: 26 meq/L (ref 19–32)
CREATININE: 0.89 mg/dL (ref 0.40–1.20)
Chloride: 103 mEq/L (ref 96–112)
GFR: 60.22 mL/min (ref >60.00)
Glucose, Bld: 95 mg/dL (ref 70–99)
Potassium: 4.3 mEq/L (ref 3.5–5.1)
Sodium: 137 mEq/L (ref 135–145)

## 2018-06-30 LAB — TSH: TSH: 3.66 u[IU]/mL (ref 0.35–4.50)

## 2018-06-30 LAB — HEPATIC FUNCTION PANEL
ALBUMIN: 4 g/dL (ref 3.5–5.2)
ALK PHOS: 47 U/L (ref 39–117)
ALT: 13 U/L (ref 0–35)
AST: 20 U/L (ref 0–37)
Bilirubin, Direct: 0.2 mg/dL (ref 0.0–0.3)
Total Bilirubin: 1.1 mg/dL (ref 0.2–1.2)
Total Protein: 6.8 g/dL (ref 6.0–8.3)

## 2018-06-30 NOTE — Progress Notes (Addendum)
Patient ID: Bridget Briggs, female   DOB: [birth year], 85 y.o.   MRN: [id number]   Subjective:    Patient ID: Bridget Briggs, female    DOB: 1931/01/04, 85 y.o.   MRN: [id number]  HPI  Patient here for a scheduled follow up.  She had a dental procedure yesterday. Has been off eliquis for the last 6 days.  States since being off, she has noticed being a little more sob with increased activity.  Denies any chest pain.  Denies any notice of increased heart rate or palpitations.  Does report some persistent cough.  This has been present for weeks.  No fever.  No sinus pressure.  Denies acid reflux.  No abdominal pain.  Bowels moving.  Persistent right hip pain.  Saw ortho.  S/p injection.  Plan was to refer to PT.  States has not been arranged.  Request referral to PT.     Past Medical History:  Diagnosis Date  . Arthritis   . Depression   . GERD (gastroesophageal reflux disease)   . Glaucoma   . History of chicken pox   . History of colon polyps   . Hx: UTI (urinary tract infection)    Past Surgical History:  Procedure Laterality Date  . ABDOMINAL HYSTERECTOMY  1974   left ovary removed  . ANKLE SURGERY Left 1998  . APPENDECTOMY  1974  . BREAST BIOPSY Right 1988   NEG  . BREAST SURGERY Right 1988   biopsy  . CARDIAC CATHETERIZATION N/A 09/06/2015   Procedure: Right/Left Heart Cath and Coronary Angiography;  Surgeon: Yolonda Kida, MD;  Location: [facility];  Service: Cardiovascular;  Laterality: N/A;  . CHOLECYSTECTOMY  1996  . EYE SURGERY Bilateral 2005/2006   cataract  . LAPAROSCOPIC GASTRIC BANDING WITH HIATAL HERNIA REPAIR  2000  . NISSEN FUNDOPLICATION  0076  . OOPHORECTOMY Left   . TONSILLECTOMY AND ADENOIDECTOMY  1940   Family History  Problem Relation Age of Onset  . Arthritis Mother   . Leukemia Mother   . Lung cancer Father   . Osteoporosis Other   . Breast cancer Neg Hx    Social History   Socioeconomic History  . Marital status: Married   Spouse name: Not on file  . Number of children: Not on file  . Years of education: Not on file  . Highest education level: Not on file  Occupational History  . Not on file  Social Needs  . Financial resource strain: Not hard at all  . Food insecurity:    Worry: Never true    Inability: Never true  . Transportation needs:    Medical: No    Non-medical: No  Tobacco Use  . Smoking status: Former Smoker    Packs/day: 1.00    Years: 30.00    Pack years: 30.00    Types: Cigarettes    Last attempt to quit: 05/20/1988    Years since quitting: 30.1  . Smokeless tobacco: Never Used  Substance and Sexual Activity  . Alcohol use: No    Alcohol/week: 0.0 standard drinks  . Drug use: No  . Sexual activity: Never  Lifestyle  . Physical activity:    Days per week: 3 days    Minutes per session: 60 min  . Stress: Not at all  Relationships  . Social connections:    Talks on phone: Not on file    Gets together: Not on file    Attends religious service: Not  on file    Active member of club or organization: Not on file    Attends meetings of clubs or organizations: Not on file    Relationship status: Widowed  Other Topics Concern  . Not on file  Social History Narrative  . Not on file    Outpatient Encounter Medications as of 06/30/2018  Medication Sig  . apixaban (ELIQUIS) 5 MG TABS tablet Take 1 tablet (5 mg total) by mouth 2 (two) times daily.  Marland Kitchen Apoaequorin (PREVAGEN PO) Take 1 tablet by mouth daily.  . beta carotene w/minerals (OCUVITE) tablet Take 1 tablet by mouth daily.  . calcium-vitamin D (OSCAL WITH D) 500-200 MG-UNIT per tablet Take 1 tablet by mouth daily.  . diphenhydramine-acetaminophen (TYLENOL PM) 25-500 MG TABS Take 1 tablet by mouth at bedtime as needed.  . dorzolamide-timolol (COSOPT) 22.3-6.8 MG/ML ophthalmic solution Place 1 drop into both eyes 2 (two) times daily.  . fluocinolone (SYNALAR) 0.01 % external solution Apply topically 2 (two) times daily as needed.    . latanoprost (XALATAN) 0.005 % ophthalmic solution Place 1 drop into both eyes at bedtime.  . metoprolol tartrate (LOPRESSOR) 25 MG tablet Take 1 tablet (25 mg total) by mouth 2 (two) times daily.  . vitamin D, CHOLECALCIFEROL, 400 UNITS tablet Take 400 Units by mouth daily.   No facility-administered encounter medications on file as of 06/30/2018.     Review of Systems  Constitutional: Negative for appetite change and unexpected weight change.  HENT: Negative for congestion and sinus pressure.   Respiratory: Positive for cough and shortness of breath. Negative for chest tightness.   Cardiovascular: Negative for chest pain and leg swelling.  Gastrointestinal: Negative for abdominal pain, diarrhea, nausea and vomiting.  Genitourinary: Negative for difficulty urinating and dysuria.  Musculoskeletal: Negative for joint swelling.       Persistent right hip pain as outlined.    Skin: Negative for color change and rash.  Neurological: Negative for dizziness, light-headedness and headaches.  Psychiatric/Behavioral: Negative for agitation and dysphoric mood.       Objective:    Physical Exam Constitutional:      General: She is not in acute distress.    Appearance: Normal appearance.  HENT:     Nose: Nose normal. No congestion.     Mouth/Throat:     Pharynx: No oropharyngeal exudate or posterior oropharyngeal erythema.  Neck:     Musculoskeletal: Neck supple. No muscular tenderness.     Thyroid: No thyromegaly.  Cardiovascular:     Comments: Appears to be irregular.  Ventricular rate 92-104 Pulmonary:     Effort: No respiratory distress.     Breath sounds: Normal breath sounds. No wheezing.  Abdominal:     General: Bowel sounds are normal.     Palpations: Abdomen is soft.     Tenderness: There is no abdominal tenderness.  Musculoskeletal:        General: No swelling or tenderness.  Lymphadenopathy:     Cervical: No cervical adenopathy.  Skin:    Findings: No erythema or  rash.  Neurological:     Mental Status: She is alert.  Psychiatric:        Mood and Affect: Mood normal.        Behavior: Behavior normal.     BP 117/80   Pulse (!) 105   Temp 97.7 F (36.5 C) (Oral)   Wt 184 lb 9.6 oz (83.7 kg)   SpO2 96%   BMI 31.94 kg/m  Wt Readings  from Last 3 Encounters:  06/30/18 184 lb 9.6 oz (83.7 kg)  05/07/18 185 lb 12.8 oz (84.3 kg)  03/12/18 188 lb (85.3 kg)     Lab Results  Component Value Date   WBC 7.2 11/27/2017   HGB 14.9 11/27/2017   HCT 45.0 11/27/2017   PLT 206.0 11/27/2017   GLUCOSE 95 06/30/2018   CHOL 185 03/03/2018   TRIG 140.0 03/03/2018   HDL 58.40 03/03/2018   LDLCALC 98 03/03/2018   ALT 13 06/30/2018   AST 20 06/30/2018   NA 137 06/30/2018   K 4.3 06/30/2018   CL 103 06/30/2018   CREATININE 0.89 06/30/2018   BUN 14 06/30/2018   CO2 26 06/30/2018   TSH 3.66 06/30/2018    Mm 3d Screen Breast Bilateral  Result Date: 01/15/2018 CLINICAL DATA:  Screening. EXAM: DIGITAL SCREENING BILATERAL MAMMOGRAM WITH TOMO AND CAD COMPARISON:  Previous exam(s). ACR Breast Density Category a: The breast tissue is almost entirely fatty. FINDINGS: There are no findings suspicious for malignancy. Images were processed with CAD. IMPRESSION: No mammographic evidence of malignancy. A result letter of this screening mammogram will be mailed directly to the patient. RECOMMENDATION: Screening mammogram in one year. (Code:SM-B-01Y) BI-RADS CATEGORY  1: Negative. Electronically Signed   By: Kristopher Oppenheim M.D.   On: 01/15/2018 16:00       Assessment & Plan:   Problem List Items Addressed This Visit    Anemia    Follow cbc.       Aortic atherosclerosis (HCC)    Not on statin medication.  Follow.       Atrial fibrillation ([place]) - Primary    Has been off eliquis recently for dental procedure.  With increased heart rate today.  Some noticed of increased sob with exertion recently.  She reports this started since being off eliquis.  She declined  EKG. Sees Dr Clayborn Bigness.  Discussed with Dr Clayborn Bigness.  Restart eliquis.  On metoprolol.  Can take 1/2 metoprolol prn increased heart rate.  Check cxr as outlined.    Addendum.  CXR revealed cardiac enlargement with question of pericardial effusion.  Will have cardiology evaluate with question of need for ECHO.  Pt in agreement.        Relevant Orders   Hepatic function panel (Completed)   TSH (Completed)   Basic metabolic panel (Completed)   B12 deficiency    Check B12 level.        Bronchiectasis without acute exacerbation ([place])    Has been followed by Dr Mortimer Fries.  Recently noticed some increased sob with exertion.  Has also had persistent cough.  nasacort nasal spray as directed.  Should help with drainage.   Check cxr.        Cough    Persistent cough as outlined.  nasacort nasal spray as directed.  Should help with drainage.   No acid reflux.  Check cxr.    Addendum:  CXR reviewed.  Question or right lower lobe infiltrate.  Discussed with pt.  Treat with azithromycin.  Discussed the need for probiotic.  Follow.        Elevated TSH    Saw endocrinology.  Recent TSH wnl.  Follow.        GERD (gastroesophageal reflux disease)    No acid reflux or upper GI symptoms reported.  Follow.       Right hip pain    Persistent pain as outlined.  Saw ortho.  S/p injection.  Had recommended physical therapy.  Contact  ortho to schedule.         Other Visit Diagnoses    Persistent cough       Relevant Orders   DG Chest 2 View (Completed)       Einar Pheasant, MD

## 2018-06-30 NOTE — Patient Instructions (Signed)
nasacort nasal spray - 2 sprays each nostril one time per day.  Do this in the evening.   

## 2018-07-01 ENCOUNTER — Other Ambulatory Visit: Payer: Self-pay | Admitting: Internal Medicine

## 2018-07-01 MED ORDER — AZITHROMYCIN 250 MG PO TABS: ORAL_TABLET | ORAL | 0 refills | Status: DC

## 2018-07-01 NOTE — Progress Notes (Signed)
rx sent in for zpak.  See result note.

## 2018-07-02 ENCOUNTER — Encounter: Payer: Self-pay | Admitting: Internal Medicine

## 2018-07-02 DIAGNOSIS — M25551 Pain in right hip: Secondary | ICD-10-CM | POA: Insufficient documentation

## 2018-07-02 DIAGNOSIS — R7989 Other specified abnormal findings of blood chemistry: Secondary | ICD-10-CM | POA: Insufficient documentation

## 2018-07-02 NOTE — Assessment & Plan Note (Addendum)
Has been off eliquis recently for dental procedure.  With increased heart rate today.  Some noticed of increased sob with exertion recently.  She reports this started since being off eliquis.  She declined EKG. Sees Dr Clayborn Bigness.  Discussed with Dr Clayborn Bigness.  Restart eliquis.  On metoprolol.  Can take 1/2 metoprolol prn increased heart rate.  Check cxr as outlined.    Addendum.  CXR revealed cardiac enlargement with question of pericardial effusion.  Will have cardiology evaluate with question of need for ECHO.  Pt in agreement.

## 2018-07-02 NOTE — Assessment & Plan Note (Signed)
No acid reflux or upper GI symptoms reported.  Follow.

## 2018-07-02 NOTE — Assessment & Plan Note (Addendum)
Has been followed by Dr Mortimer Fries.  Recently noticed some increased sob with exertion.  Has also had persistent cough.  nasacort nasal spray as directed.  Should help with drainage.   Check cxr.

## 2018-07-02 NOTE — Assessment & Plan Note (Signed)
Check B12 level. 

## 2018-07-02 NOTE — Assessment & Plan Note (Signed)
Not on statin medication.  Follow.  

## 2018-07-02 NOTE — Assessment & Plan Note (Addendum)
Persistent cough as outlined.  nasacort nasal spray as directed.  Should help with drainage.   No acid reflux.  Check cxr.    Addendum:  CXR reviewed.  Question or right lower lobe infiltrate.  Discussed with pt.  Treat with azithromycin.  Discussed the need for probiotic.  Follow.

## 2018-07-02 NOTE — Assessment & Plan Note (Signed)
Persistent pain as outlined.  Saw ortho.  S/p injection.  Had recommended physical therapy.  Contact ortho to schedule.

## 2018-07-02 NOTE — Assessment & Plan Note (Signed)
Saw endocrinology.  Recent TSH wnl.  Follow.

## 2018-07-02 NOTE — Assessment & Plan Note (Signed)
Follow cbc.  

## 2018-07-07 DIAGNOSIS — R011 Cardiac murmur, unspecified: Secondary | ICD-10-CM | POA: Diagnosis not present

## 2018-07-07 DIAGNOSIS — I4891 Unspecified atrial fibrillation: Secondary | ICD-10-CM | POA: Diagnosis not present

## 2018-07-07 DIAGNOSIS — R000 Tachycardia, unspecified: Secondary | ICD-10-CM | POA: Diagnosis not present

## 2018-07-07 DIAGNOSIS — I208 Other forms of angina pectoris: Secondary | ICD-10-CM | POA: Diagnosis not present

## 2018-08-06 ENCOUNTER — Encounter: Payer: Self-pay | Admitting: Internal Medicine

## 2018-08-06 ENCOUNTER — Ambulatory Visit (INDEPENDENT_AMBULATORY_CARE_PROVIDER_SITE_OTHER): Payer: Medicare Other | Admitting: Internal Medicine

## 2018-08-06 ENCOUNTER — Other Ambulatory Visit: Payer: Self-pay

## 2018-08-06 DIAGNOSIS — I480 Paroxysmal atrial fibrillation: Secondary | ICD-10-CM | POA: Diagnosis not present

## 2018-08-06 DIAGNOSIS — J479 Bronchiectasis, uncomplicated: Secondary | ICD-10-CM

## 2018-08-06 DIAGNOSIS — D649 Anemia, unspecified: Secondary | ICD-10-CM

## 2018-08-06 NOTE — Progress Notes (Signed)
Patient ID: Bridget Briggs, female   DOB: February 13, 1931, 85 y.o.   MRN: [id number]   Subjective:    Patient ID: Bridget Briggs, female    DOB: [birth year], 85 y.o.   MRN: [id number]  HPI  Patient here for a scheduled follow up. Since her last visit, she has seen cardiology.  Note reviewed.  F/u afib.  Also to f/u regarding cardiomegaly and question of pericardial effusion.  Note reviewed.  No changes made.  Felt stable. Recommended f/u in one year.  She reports she is doing well.  Feels good.  Denies any increased heart rate or palpitations now that she is back on her eliquis.  No chest pain.  Breathing stable.  Denies sob.  No acid reflux.  No abdominal pain.  Bowels moving.  Discussed her medications.  Discussed heart rate 100.  She is off her metoprolol.  Appears to not be taking.  Discussed with her.  She is going to check when she gets home and restart if not taking.  Discussed she should be taking 25mg  bid.     Past Medical History:  Diagnosis Date  . Arthritis   . Depression   . GERD (gastroesophageal reflux disease)   . Glaucoma   . History of chicken pox   . History of colon polyps   . Hx: UTI (urinary tract infection)    Past Surgical History:  Procedure Laterality Date  . ABDOMINAL HYSTERECTOMY  1974   left ovary removed  . ANKLE SURGERY Left 1998  . APPENDECTOMY  1974  . BREAST BIOPSY Right 1988   NEG  . BREAST SURGERY Right 1988   biopsy  . CARDIAC CATHETERIZATION N/A 09/06/2015   Procedure: Right/Left Heart Cath and Coronary Angiography;  Surgeon: Yolonda Kida, MD;  Location: [facility];  Service: Cardiovascular;  Laterality: N/A;  . CHOLECYSTECTOMY  1996  . EYE SURGERY Bilateral 2005/2006   cataract  . LAPAROSCOPIC GASTRIC BANDING WITH HIATAL HERNIA REPAIR  2000  . NISSEN FUNDOPLICATION  6389  . OOPHORECTOMY Left   . TONSILLECTOMY AND ADENOIDECTOMY  1940   Family History  Problem Relation Age of Onset  . Arthritis Mother   . Leukemia Mother   .  Lung cancer Father   . Osteoporosis Other   . Breast cancer Neg Hx    Social History   Socioeconomic History  . Marital status: Married    Spouse name: Not on file  . Number of children: Not on file  . Years of education: Not on file  . Highest education level: Not on file  Occupational History  . Not on file  Social Needs  . Financial resource strain: Not hard at all  . Food insecurity:    Worry: Never true    Inability: Never true  . Transportation needs:    Medical: No    Non-medical: No  Tobacco Use  . Smoking status: Former Smoker    Packs/day: 1.00    Years: 30.00    Pack years: 30.00    Types: Cigarettes    Last attempt to quit: 05/20/1988    Years since quitting: 30.2  . Smokeless tobacco: Never Used  Substance and Sexual Activity  . Alcohol use: No    Alcohol/week: 0.0 standard drinks  . Drug use: No  . Sexual activity: Never  Lifestyle  . Physical activity:    Days per week: 3 days    Minutes per session: 60 min  . Stress: Not at all  Relationships  . Social connections:    Talks on phone: Not on file    Gets together: Not on file    Attends religious service: Not on file    Active member of club or organization: Not on file    Attends meetings of clubs or organizations: Not on file    Relationship status: Widowed  Other Topics Concern  . Not on file  Social History Narrative  . Not on file    Outpatient Encounter Medications as of 08/06/2018  Medication Sig  . metoprolol tartrate (LOPRESSOR) 25 MG tablet Take 25 mg by mouth 2 (two) times daily.  Marland Kitchen apixaban (ELIQUIS) 5 MG TABS tablet Take 1 tablet (5 mg total) by mouth 2 (two) times daily.  . beta carotene w/minerals (OCUVITE) tablet Take 1 tablet by mouth daily.  . calcium-vitamin D (OSCAL WITH D) 500-200 MG-UNIT per tablet Take 1 tablet by mouth daily.  . diphenhydramine-acetaminophen (TYLENOL PM) 25-500 MG TABS Take 1 tablet by mouth at bedtime as needed.  . dorzolamide-timolol (COSOPT) 22.3-6.8  MG/ML ophthalmic solution Place 1 drop into both eyes 2 (two) times daily.  . fluocinolone (SYNALAR) 0.01 % external solution Apply topically 2 (two) times daily as needed.  . latanoprost (XALATAN) 0.005 % ophthalmic solution Place 1 drop into both eyes at bedtime.  . vitamin D, CHOLECALCIFEROL, 400 UNITS tablet Take 400 Units by mouth daily.  . [DISCONTINUED] Apoaequorin (PREVAGEN PO) Take 1 tablet by mouth daily.  . [DISCONTINUED] azithromycin (ZITHROMAX) 250 MG tablet Take 2 tablets x 1 day and then one tablet per day for four more days.  . [DISCONTINUED] metoprolol tartrate (LOPRESSOR) 25 MG tablet Take 1 tablet (25 mg total) by mouth 2 (two) times daily.   No facility-administered encounter medications on file as of 08/06/2018.     Review of Systems  Constitutional: Negative for appetite change and unexpected weight change.  HENT: Negative for congestion and sinus pressure.   Respiratory: Negative for cough, chest tightness and shortness of breath.   Cardiovascular: Negative for chest pain, palpitations and leg swelling.  Gastrointestinal: Negative for abdominal pain, diarrhea, nausea and vomiting.  Genitourinary: Negative for difficulty urinating and dysuria.  Musculoskeletal: Negative for joint swelling and myalgias.  Skin: Negative for color change and rash.  Neurological: Negative for dizziness, light-headedness and headaches.  Psychiatric/Behavioral: Negative for agitation and dysphoric mood.       Objective:    Physical Exam Constitutional:      General: She is not in acute distress.    Appearance: Normal appearance.  HENT:     Nose: Nose normal. No congestion.     Mouth/Throat:     Pharynx: No oropharyngeal exudate or posterior oropharyngeal erythema.  Neck:     Musculoskeletal: Neck supple.     Thyroid: No thyromegaly.  Cardiovascular:     Rate and Rhythm: Normal rate and regular rhythm.     Comments: Pulse rate:  96-100 Pulmonary:     Effort: No respiratory  distress.     Breath sounds: Normal breath sounds. No wheezing.  Abdominal:     General: Bowel sounds are normal.     Palpations: Abdomen is soft.     Tenderness: There is no abdominal tenderness.  Musculoskeletal:        General: No swelling or tenderness.  Lymphadenopathy:     Cervical: No cervical adenopathy.  Skin:    Findings: No erythema or rash.  Neurological:     Mental Status: She is alert.  Psychiatric:        Mood and Affect: Mood normal.        Behavior: Behavior normal.     BP 126/70   Pulse 100   Temp 97.6 F (36.4 C) (Oral)   Resp 16   Wt 187 lb (84.8 kg)   SpO2 97%   BMI 32.35 kg/m  Wt Readings from Last 3 Encounters:  08/06/18 187 lb (84.8 kg)  06/30/18 184 lb 9.6 oz (83.7 kg)  05/07/18 185 lb 12.8 oz (84.3 kg)     Lab Results  Component Value Date   WBC 7.2 11/27/2017   HGB 14.9 11/27/2017   HCT 45.0 11/27/2017   PLT 206.0 11/27/2017   GLUCOSE 95 06/30/2018   CHOL 185 03/03/2018   TRIG 140.0 03/03/2018   HDL 58.40 03/03/2018   LDLCALC 98 03/03/2018   ALT 13 06/30/2018   AST 20 06/30/2018   NA 137 06/30/2018   K 4.3 06/30/2018   CL 103 06/30/2018   CREATININE 0.89 06/30/2018   BUN 14 06/30/2018   CO2 26 06/30/2018   TSH 3.66 06/30/2018    Mm 3d Screen Breast Bilateral  Result Date: 01/15/2018 CLINICAL DATA:  Screening. EXAM: DIGITAL SCREENING BILATERAL MAMMOGRAM WITH TOMO AND CAD COMPARISON:  Previous exam(s). ACR Breast Density Category a: The breast tissue is almost entirely fatty. FINDINGS: There are no findings suspicious for malignancy. Images were processed with CAD. IMPRESSION: No mammographic evidence of malignancy. A result letter of this screening mammogram will be mailed directly to the patient. RECOMMENDATION: Screening mammogram in one year. (Code:SM-B-01Y) BI-RADS CATEGORY  1: Negative. Electronically Signed   By: Kristopher Oppenheim M.D.   On: 01/15/2018 16:00       Assessment & Plan:   Problem List Items Addressed This  Visit    Anemia    Follow cbc.       Atrial fibrillation ([place])    Pt without symptoms.  Heart rate 96-100.  Appears to be off metoprolol.  Will restart.  Saw cardiology recently.  No changes made and no further w/up felt warranted.  Follow.        Relevant Medications   metoprolol tartrate (LOPRESSOR) 25 MG tablet   Bronchiectasis without acute exacerbation (HCC)    Has been followed by Dr Mortimer Fries.  Feels breathing stable.  Denies sob.  Follow.            Einar Pheasant, MD

## 2018-08-07 ENCOUNTER — Encounter: Payer: Self-pay | Admitting: Internal Medicine

## 2018-08-07 NOTE — Assessment & Plan Note (Signed)
Has been followed by Dr Mortimer Fries.  Feels breathing stable.  Denies sob.  Follow.

## 2018-08-07 NOTE — Assessment & Plan Note (Signed)
Follow cbc.  

## 2018-08-07 NOTE — Assessment & Plan Note (Signed)
Pt without symptoms.  Heart rate 96-100.  Appears to be off metoprolol.  Will restart.  Saw cardiology recently.  No changes made and no further w/up felt warranted.  Follow.

## 2018-09-10 DIAGNOSIS — R946 Abnormal results of thyroid function studies: Secondary | ICD-10-CM | POA: Diagnosis not present

## 2018-09-10 DIAGNOSIS — R7989 Other specified abnormal findings of blood chemistry: Secondary | ICD-10-CM | POA: Diagnosis not present

## 2018-09-24 DIAGNOSIS — R7989 Other specified abnormal findings of blood chemistry: Secondary | ICD-10-CM | POA: Diagnosis not present

## 2018-10-01 DIAGNOSIS — H40153 Residual stage of open-angle glaucoma, bilateral: Secondary | ICD-10-CM | POA: Diagnosis not present

## 2018-10-09 ENCOUNTER — Encounter: Payer: Self-pay | Admitting: Internal Medicine

## 2018-10-09 ENCOUNTER — Ambulatory Visit (INDEPENDENT_AMBULATORY_CARE_PROVIDER_SITE_OTHER): Payer: Medicare Other | Admitting: Internal Medicine

## 2018-10-09 ENCOUNTER — Other Ambulatory Visit: Payer: Self-pay

## 2018-10-09 DIAGNOSIS — J479 Bronchiectasis, uncomplicated: Secondary | ICD-10-CM | POA: Diagnosis not present

## 2018-10-09 DIAGNOSIS — D649 Anemia, unspecified: Secondary | ICD-10-CM

## 2018-10-09 DIAGNOSIS — I6529 Occlusion and stenosis of unspecified carotid artery: Secondary | ICD-10-CM

## 2018-10-09 DIAGNOSIS — I480 Paroxysmal atrial fibrillation: Secondary | ICD-10-CM | POA: Diagnosis not present

## 2018-10-09 DIAGNOSIS — I700 Atherosclerosis of aorta: Secondary | ICD-10-CM

## 2018-10-09 NOTE — Progress Notes (Signed)
Patient ID: Bridget Briggs, female   DOB: 10/14/1930, 85 y.o.   MRN: [id number]   Virtual Visit via telephone Note  This visit type was conducted due to national recommendations for restrictions regarding the COVID-19 pandemic (e.g. social distancing).  This format is felt to be most appropriate for this patient at this time.  All issues noted in this document were discussed and addressed.  No physical exam was performed (except for noted visual exam findings with Video Visits).   I connected with [name] by telephone and verified that I am speaking with the correct person using two identifiers. Location patient: home Location provider: work Persons participating in the virtual visit: patient, provider  I discussed the limitations, risks, security and privacy concerns of performing an evaluation and management service by telephone and the availability of in person appointments. The patient expressed understanding and agreed to proceed.   Reason for visit: scheduled follow up.   HPI: She reports she is doing well.  Staying in due to COVID restrictions.  No fever.  No chest congestion.  No cough or sob.  Breathing doing well.  No chest pain.  No acid reflux.  No abdominal pain.  Bowels moving.  Taking metoprolol.  Seeing cardiology.  No increased heart rate or palpitations.  She is mall walking.  Overall feels good.     ROS: See pertinent positives and negatives per HPI.  Past Medical History:  Diagnosis Date  . Arthritis   . Depression   . GERD (gastroesophageal reflux disease)   . Glaucoma   . History of chicken pox   . History of colon polyps   . Hx: UTI (urinary tract infection)     Past Surgical History:  Procedure Laterality Date  . ABDOMINAL HYSTERECTOMY  1974   left ovary removed  . ANKLE SURGERY Left 1998  . APPENDECTOMY  1974  . BREAST BIOPSY Right 1988   NEG  . BREAST SURGERY Right 1988   biopsy  . CARDIAC CATHETERIZATION N/A 09/06/2015   Procedure:  Right/Left Heart Cath and Coronary Angiography;  Surgeon: Yolonda Kida, MD;  Location: [facility];  Service: Cardiovascular;  Laterality: N/A;  . CHOLECYSTECTOMY  1996  . EYE SURGERY Bilateral 2005/2006   cataract  . LAPAROSCOPIC GASTRIC BANDING WITH HIATAL HERNIA REPAIR  2000  . NISSEN FUNDOPLICATION  0174  . OOPHORECTOMY Left   . TONSILLECTOMY AND ADENOIDECTOMY  1940    Family History  Problem Relation Age of Onset  . Arthritis Mother   . Leukemia Mother   . Lung cancer Father   . Osteoporosis Other   . Breast cancer Neg Hx     SOCIAL HX: reviewed.    Current Outpatient Medications:  .  apixaban (ELIQUIS) 5 MG TABS tablet, Take 1 tablet (5 mg total) by mouth 2 (two) times daily., Disp: 60 tablet, Rfl: 0 .  beta carotene w/minerals (OCUVITE) tablet, Take 1 tablet by mouth daily., Disp: , Rfl:  .  calcium-vitamin D (OSCAL WITH D) 500-200 MG-UNIT per tablet, Take 1 tablet by mouth daily., Disp: , Rfl:  .  diphenhydramine-acetaminophen (TYLENOL PM) 25-500 MG TABS, Take 1 tablet by mouth at bedtime as needed., Disp: , Rfl:  .  dorzolamide-timolol (COSOPT) 22.3-6.8 MG/ML ophthalmic solution, Place 1 drop into both eyes 2 (two) times daily., Disp: , Rfl:  .  fluocinolone (SYNALAR) 0.01 % external solution, Apply topically 2 (two) times daily as needed., Disp: , Rfl:  .  latanoprost (XALATAN) 0.005 %  ophthalmic solution, Place 1 drop into both eyes at bedtime., Disp: , Rfl:  .  metoprolol tartrate (LOPRESSOR) 25 MG tablet, Take 25 mg by mouth 2 (two) times daily., Disp: , Rfl:  .  vitamin D, CHOLECALCIFEROL, 400 UNITS tablet, Take 400 Units by mouth daily., Disp: , Rfl:   EXAM:  GENERAL: alert. Sounds to be in no acute distress.  Answering questions appropriately.    PSYCH/NEURO: pleasant and cooperative, no obvious depression or anxiety, speech and thought processing grossly intact  ASSESSMENT AND PLAN:  Discussed the following assessment and plan:  Anemia,  unspecified type - Plan: CBC with Differential/Platelet  Aortic atherosclerosis (HCC)  Paroxysmal atrial fibrillation (HCC) - Plan: TSH, Lipid panel, Hepatic function panel, Basic metabolic panel  Bronchiectasis without acute exacerbation (HCC)  Stenosis of carotid artery, unspecified laterality  Anemia Follow cbc.   Aortic atherosclerosis (HCC) Not on statin medication.  Follow.   Atrial fibrillation (HCC) Taking metoprolol.  Doing well.  Rate controlled.  No increased heart rate or palpitations.  On eliquis.  Followed by cardiology.    Bronchiectasis without acute exacerbation Has been followed by Dr Mortimer Fries.  Feels breathing is stable.  Follow.    Carotid stenosis Continue risk factor modification.     I discussed the assessment and treatment plan with the patient. The patient was provided an opportunity to ask questions and all were answered. The patient agreed with the plan and demonstrated an understanding of the instructions.   The patient was advised to call back or seek an in-person evaluation if the symptoms worsen or if the condition fails to improve as anticipated.  I provided 15 minutes of non-face-to-face time during this encounter.   Einar Pheasant, MD

## 2018-10-11 ENCOUNTER — Encounter: Payer: Self-pay | Admitting: Internal Medicine

## 2018-10-11 NOTE — Assessment & Plan Note (Signed)
Taking metoprolol.  Doing well.  Rate controlled.  No increased heart rate or palpitations.  On eliquis.  Followed by cardiology.

## 2018-10-11 NOTE — Assessment & Plan Note (Signed)
Follow cbc.  

## 2018-10-11 NOTE — Assessment & Plan Note (Signed)
Not on statin medication.  Follow.

## 2018-10-11 NOTE — Assessment & Plan Note (Signed)
Has been followed by Dr Mortimer Fries.  Feels breathing is stable.  Follow.

## 2018-10-11 NOTE — Assessment & Plan Note (Signed)
Continue risk factor modification 

## 2018-10-14 DIAGNOSIS — R011 Cardiac murmur, unspecified: Secondary | ICD-10-CM | POA: Diagnosis not present

## 2018-10-14 DIAGNOSIS — R000 Tachycardia, unspecified: Secondary | ICD-10-CM | POA: Diagnosis not present

## 2018-10-14 DIAGNOSIS — I208 Other forms of angina pectoris: Secondary | ICD-10-CM | POA: Diagnosis not present

## 2018-10-14 DIAGNOSIS — I4891 Unspecified atrial fibrillation: Secondary | ICD-10-CM | POA: Diagnosis not present

## 2018-12-02 DIAGNOSIS — M79672 Pain in left foot: Secondary | ICD-10-CM | POA: Diagnosis not present

## 2018-12-02 DIAGNOSIS — D2372 Other benign neoplasm of skin of left lower limb, including hip: Secondary | ICD-10-CM | POA: Diagnosis not present

## 2018-12-09 ENCOUNTER — Other Ambulatory Visit: Payer: Medicare Other

## 2018-12-10 ENCOUNTER — Other Ambulatory Visit: Payer: Self-pay

## 2018-12-10 ENCOUNTER — Other Ambulatory Visit (INDEPENDENT_AMBULATORY_CARE_PROVIDER_SITE_OTHER): Payer: Medicare Other

## 2018-12-10 DIAGNOSIS — I480 Paroxysmal atrial fibrillation: Secondary | ICD-10-CM | POA: Diagnosis not present

## 2018-12-10 DIAGNOSIS — D649 Anemia, unspecified: Secondary | ICD-10-CM

## 2018-12-10 LAB — CBC WITH DIFFERENTIAL/PLATELET
Basophils Absolute: 0.1 K/uL (ref 0.0–0.1)
Basophils Relative: 1 % (ref 0.0–3.0)
Eosinophils Absolute: 0.1 K/uL (ref 0.0–0.7)
Eosinophils Relative: 1.6 % (ref 0.0–5.0)
HCT: 43.9 % (ref 36.0–46.0)
Hemoglobin: 14.4 g/dL (ref 12.0–15.0)
Lymphocytes Relative: 42.6 % (ref 12.0–46.0)
Lymphs Abs: 2.7 K/uL (ref 0.7–4.0)
MCHC: 32.9 g/dL (ref 30.0–36.0)
MCV: 89.7 fl (ref 78.0–100.0)
Monocytes Absolute: 0.5 K/uL (ref 0.1–1.0)
Monocytes Relative: 8.7 % (ref 3.0–12.0)
Neutro Abs: 2.9 K/uL (ref 1.4–7.7)
Neutrophils Relative %: 46.1 % (ref 43.0–77.0)
Platelets: 217 K/uL (ref 150.0–400.0)
RBC: 4.89 Mil/uL (ref 3.87–5.11)
RDW: 14.3 % (ref 11.5–15.5)
WBC: 6.2 K/uL (ref 4.0–10.5)

## 2018-12-10 LAB — BASIC METABOLIC PANEL
BUN: 15 mg/dL (ref 6–23)
CO2: 28 mEq/L (ref 19–32)
Calcium: 10.1 mg/dL (ref 8.4–10.5)
Chloride: 105 mEq/L (ref 96–112)
Creatinine, Ser: 0.84 mg/dL (ref 0.40–1.20)
GFR: 64.31 mL/min (ref >60.00)
Glucose, Bld: 91 mg/dL (ref 70–99)
Potassium: 4.1 mEq/L (ref 3.5–5.1)
Sodium: 141 mEq/L (ref 135–145)

## 2018-12-10 LAB — HEPATIC FUNCTION PANEL
ALT: 16 U/L (ref 0–35)
AST: 23 U/L (ref 0–37)
Albumin: 4.1 g/dL (ref 3.5–5.2)
Alkaline Phosphatase: 50 U/L (ref 39–117)
Bilirubin, Direct: 0.1 mg/dL (ref 0.0–0.3)
Total Bilirubin: 0.9 mg/dL (ref 0.2–1.2)
Total Protein: 6.5 g/dL (ref 6.0–8.3)

## 2018-12-10 LAB — LIPID PANEL
Cholesterol: 199 mg/dL (ref 0–200)
HDL: 54.7 mg/dL
LDL Cholesterol: 108 mg/dL — ABNORMAL HIGH (ref 0–99)
NonHDL: 144.1
Total CHOL/HDL Ratio: 4
Triglycerides: 179 mg/dL — ABNORMAL HIGH (ref 0.0–149.0)
VLDL: 35.8 mg/dL (ref 0.0–40.0)

## 2018-12-10 LAB — TSH: TSH: 4.12 u[IU]/mL (ref 0.35–4.50)

## 2018-12-23 DIAGNOSIS — M79672 Pain in left foot: Secondary | ICD-10-CM | POA: Diagnosis not present

## 2018-12-23 DIAGNOSIS — D2372 Other benign neoplasm of skin of left lower limb, including hip: Secondary | ICD-10-CM | POA: Diagnosis not present

## 2019-02-02 DIAGNOSIS — H524 Presbyopia: Secondary | ICD-10-CM | POA: Diagnosis not present

## 2019-02-02 DIAGNOSIS — H40153 Residual stage of open-angle glaucoma, bilateral: Secondary | ICD-10-CM | POA: Diagnosis not present

## 2019-02-17 ENCOUNTER — Other Ambulatory Visit: Payer: Self-pay

## 2019-02-19 ENCOUNTER — Ambulatory Visit (INDEPENDENT_AMBULATORY_CARE_PROVIDER_SITE_OTHER): Payer: Medicare Other | Admitting: Internal Medicine

## 2019-02-19 ENCOUNTER — Encounter: Payer: Self-pay | Admitting: Internal Medicine

## 2019-02-19 ENCOUNTER — Other Ambulatory Visit: Payer: Self-pay

## 2019-02-19 VITALS — BP 118/60 | HR 54 | Temp 97.7°F | Wt 182.8 lb

## 2019-02-19 DIAGNOSIS — I480 Paroxysmal atrial fibrillation: Secondary | ICD-10-CM

## 2019-02-19 DIAGNOSIS — Z1231 Encounter for screening mammogram for malignant neoplasm of breast: Secondary | ICD-10-CM

## 2019-02-19 DIAGNOSIS — Z23 Encounter for immunization: Secondary | ICD-10-CM

## 2019-02-19 DIAGNOSIS — Z13220 Encounter for screening for lipoid disorders: Secondary | ICD-10-CM

## 2019-02-19 DIAGNOSIS — I700 Atherosclerosis of aorta: Secondary | ICD-10-CM

## 2019-02-19 DIAGNOSIS — D649 Anemia, unspecified: Secondary | ICD-10-CM

## 2019-02-19 DIAGNOSIS — J479 Bronchiectasis, uncomplicated: Secondary | ICD-10-CM

## 2019-02-19 DIAGNOSIS — Z9109 Other allergy status, other than to drugs and biological substances: Secondary | ICD-10-CM

## 2019-02-19 NOTE — Progress Notes (Signed)
Patient ID: Bridget Briggs, female   DOB: [birth year], 85 y.o.   MRN: [id number]   Subjective:    Patient ID: Bridget Briggs, female    DOB: 23-Sep-1930, 85 y.o.   MRN: [id number]  HPI  Patient here for a scheduled follow up.  She is doing well.  Feels good.  Stays active.  No chest pain. No sob.  No acid reflux.  No abdominal pain.  Bowels moving.  Sees cardiology for f/u afib.  Last evaluated 09/2018.  Stable.  Recommended f/u in one year.  Sees Dr Mortimer Fries for f/u bronchiectasis.  Breathing stable.     Past Medical History:  Diagnosis Date  . Arthritis   . Depression   . GERD (gastroesophageal reflux disease)   . Glaucoma   . History of chicken pox   . History of colon polyps   . Hx: UTI (urinary tract infection)    Past Surgical History:  Procedure Laterality Date  . ABDOMINAL HYSTERECTOMY  1974   left ovary removed  . ANKLE SURGERY Left 1998  . APPENDECTOMY  1974  . BREAST BIOPSY Right 1988   NEG  . BREAST SURGERY Right 1988   biopsy  . CARDIAC CATHETERIZATION N/A 09/06/2015   Procedure: Right/Left Heart Cath and Coronary Angiography;  Surgeon: Yolonda Kida, MD;  Location: [facility];  Service: Cardiovascular;  Laterality: N/A;  . CHOLECYSTECTOMY  1996  . EYE SURGERY Bilateral 2005/2006   cataract  . LAPAROSCOPIC GASTRIC BANDING WITH HIATAL HERNIA REPAIR  2000  . NISSEN FUNDOPLICATION  [id number]  . OOPHORECTOMY Left   . TONSILLECTOMY AND ADENOIDECTOMY  1940   Family History  Problem Relation Age of Onset  . Arthritis Mother   . Leukemia Mother   . Lung cancer Father   . Osteoporosis Other   . Breast cancer Neg Hx    Social History   Socioeconomic History  . Marital status: Married    Spouse name: Not on file  . Number of children: Not on file  . Years of education: Not on file  . Highest education level: Not on file  Occupational History  . Not on file  Social Needs  . Financial resource strain: Not hard at all  . Food insecurity    Worry: Never  true    Inability: Never true  . Transportation needs    Medical: No    Non-medical: No  Tobacco Use  . Smoking status: Former Smoker    Packs/day: 1.00    Years: 30.00    Pack years: 30.00    Types: Cigarettes    Quit date: 05/20/1988    Years since quitting: 30.7  . Smokeless tobacco: Never Used  Substance and Sexual Activity  . Alcohol use: No    Alcohol/week: 0.0 standard drinks  . Drug use: No  . Sexual activity: Never  Lifestyle  . Physical activity    Days per week: 3 days    Minutes per session: 60 min  . Stress: Not at all  Relationships  . Social Herbalist on phone: Not on file    Gets together: Not on file    Attends religious service: Not on file    Active member of club or organization: Not on file    Attends meetings of clubs or organizations: Not on file    Relationship status: Widowed  Other Topics Concern  . Not on file  Social History Narrative  . Not on file  Outpatient Encounter Medications as of 02/19/2019  Medication Sig  . apixaban (ELIQUIS) 5 MG TABS tablet Take 1 tablet (5 mg total) by mouth 2 (two) times daily.  . beta carotene w/minerals (OCUVITE) tablet Take 1 tablet by mouth daily.  . calcium-vitamin D (OSCAL WITH D) 500-200 MG-UNIT per tablet Take 1 tablet by mouth daily.  . diphenhydramine-acetaminophen (TYLENOL PM) 25-500 MG TABS Take 1 tablet by mouth at bedtime as needed.  . dorzolamide-timolol (COSOPT) 22.3-6.8 MG/ML ophthalmic solution Place 1 drop into both eyes 2 (two) times daily.  . fluocinolone (SYNALAR) 0.01 % external solution Apply topically 2 (two) times daily as needed.  . latanoprost (XALATAN) 0.005 % ophthalmic solution Place 1 drop into both eyes at bedtime.  . metoprolol tartrate (LOPRESSOR) 25 MG tablet Take 25 mg by mouth 2 (two) times daily.  . vitamin D, CHOLECALCIFEROL, 400 UNITS tablet Take 400 Units by mouth daily.   No facility-administered encounter medications on file as of 02/19/2019.    Review  of Systems  Constitutional: Negative for appetite change and unexpected weight change.  HENT: Negative for congestion and sinus pressure.   Respiratory: Negative for cough, chest tightness and shortness of breath.   Cardiovascular: Negative for chest pain, palpitations and leg swelling.  Gastrointestinal: Negative for abdominal pain, diarrhea, nausea and vomiting.  Genitourinary: Negative for difficulty urinating and dysuria.  Musculoskeletal: Negative for joint swelling and myalgias.  Skin: Negative for color change and rash.  Neurological: Negative for dizziness, light-headedness and headaches.  Psychiatric/Behavioral: Negative for agitation and dysphoric mood.       Objective:    Physical Exam Constitutional:      General: She is not in acute distress.    Appearance: Normal appearance.  HENT:     Right Ear: External ear normal.     Left Ear: External ear normal.  Eyes:     General: No scleral icterus.       Right eye: No discharge.        Left eye: No discharge.     Conjunctiva/sclera: Conjunctivae normal.  Neck:     Musculoskeletal: Neck supple. No muscular tenderness.     Thyroid: No thyromegaly.  Cardiovascular:     Rate and Rhythm: Normal rate and regular rhythm.  Pulmonary:     Effort: No respiratory distress.     Breath sounds: Normal breath sounds. No wheezing.  Abdominal:     General: Bowel sounds are normal.     Palpations: Abdomen is soft.     Tenderness: There is no abdominal tenderness.  Musculoskeletal:        General: No swelling or tenderness.  Lymphadenopathy:     Cervical: No cervical adenopathy.  Skin:    Findings: No erythema or rash.  Neurological:     Mental Status: She is alert.  Psychiatric:        Mood and Affect: Mood normal.        Behavior: Behavior normal.     BP 118/60   Pulse (!) 54   Temp 97.7 F (36.5 C)   Wt 182 lb 12.8 oz (82.9 kg)   SpO2 97%   BMI 31.62 kg/m  Wt Readings from Last 3 Encounters:  02/19/19 182 lb 12.8  oz (82.9 kg)  08/06/18 187 lb (84.8 kg)  06/30/18 184 lb 9.6 oz (83.7 kg)     Lab Results  Component Value Date   WBC 6.2 12/10/2018   HGB 14.4 12/10/2018   HCT 43.9 12/10/2018  PLT 217.0 12/10/2018   GLUCOSE 91 12/10/2018   CHOL 199 12/10/2018   TRIG 179.0 (H) 12/10/2018   HDL 54.70 12/10/2018   LDLCALC 108 (H) 12/10/2018   ALT 16 12/10/2018   AST 23 12/10/2018   NA 141 12/10/2018   K 4.1 12/10/2018   CL 105 12/10/2018   CREATININE 0.84 12/10/2018   BUN 15 12/10/2018   CO2 28 12/10/2018   TSH 4.12 12/10/2018    Mm 3d Screen Breast Bilateral  Result Date: 01/15/2018 CLINICAL DATA:  Screening. EXAM: DIGITAL SCREENING BILATERAL MAMMOGRAM WITH TOMO AND CAD COMPARISON:  Previous exam(s). ACR Breast Density Category a: The breast tissue is almost entirely fatty. FINDINGS: There are no findings suspicious for malignancy. Images were processed with CAD. IMPRESSION: No mammographic evidence of malignancy. A result letter of this screening mammogram will be mailed directly to the patient. RECOMMENDATION: Screening mammogram in one year. (Code:SM-B-01Y) BI-RADS CATEGORY  1: Negative. Electronically Signed   By: Kristopher Oppenheim M.D.   On: 01/15/2018 16:00       Assessment & Plan:   Problem List Items Addressed This Visit    Anemia    Follow cbc.       Aortic atherosclerosis (HCC)    Not on statin medication.  Follow.       Atrial fibrillation ([place])    Rate controlled.  Followed by cardiology.  On eliquis.  Stable.        Relevant Orders   Hepatic function panel   Basic metabolic panel   Bronchiectasis without acute exacerbation ([place])    Followed by pulmonary.  Breathing stable.        Environmental allergies    Controlled.         Other Visit Diagnoses    Encounter for screening mammogram for malignant neoplasm of breast    -  Primary   Relevant Orders   MM 3D SCREEN BREAST BILATERAL   Need for immunization against influenza       Relevant Orders   Flu Vaccine  QUAD High Dose(Fluad) (Completed)   Screening cholesterol level       Relevant Orders   Lipid panel       Einar Pheasant, MD

## 2019-02-21 ENCOUNTER — Encounter: Payer: Self-pay | Admitting: Internal Medicine

## 2019-02-21 NOTE — Assessment & Plan Note (Signed)
Controlled.  

## 2019-02-21 NOTE — Assessment & Plan Note (Signed)
Rate controlled.  Followed by cardiology.  On eliquis.  Stable.

## 2019-02-21 NOTE — Assessment & Plan Note (Signed)
Follow cbc.  

## 2019-02-21 NOTE — Assessment & Plan Note (Signed)
Followed by pulmonary.  Breathing stable.   

## 2019-02-21 NOTE — Assessment & Plan Note (Signed)
Not on statin medication.  Follow.  

## 2019-05-10 ENCOUNTER — Ambulatory Visit: Payer: Medicare Other

## 2019-05-11 ENCOUNTER — Other Ambulatory Visit: Payer: Self-pay

## 2019-05-11 ENCOUNTER — Telehealth: Payer: Self-pay

## 2019-05-11 ENCOUNTER — Encounter (INDEPENDENT_AMBULATORY_CARE_PROVIDER_SITE_OTHER): Payer: Self-pay

## 2019-05-11 ENCOUNTER — Ambulatory Visit (INDEPENDENT_AMBULATORY_CARE_PROVIDER_SITE_OTHER): Payer: Medicare Other

## 2019-05-11 ENCOUNTER — Ambulatory Visit: Payer: Medicare Other | Admitting: Internal Medicine

## 2019-05-11 VITALS — Ht 63.75 in | Wt 182.0 lb

## 2019-05-11 DIAGNOSIS — Z0000 Encounter for general adult medical examination without abnormal findings: Secondary | ICD-10-CM

## 2019-05-11 NOTE — Telephone Encounter (Signed)
Unable to reach patient for annual wellness visit. No answer when called at appointed time and number. Not able to leave a voice mail. Please reschedule as appropriate.

## 2019-05-11 NOTE — Patient Instructions (Addendum)
  Ms. Hepburn , Thank you for taking time to come for your Medicare Wellness Visit. I appreciate your ongoing commitment to your health goals. Please review the following plan we discussed and let me know if I can assist you in the future.   These are the goals we discussed: Goals      Patient Stated   . I would like to increase activity with play the senior games (pt-stated)       This is a list of the screening recommended for you and due dates:  Health Maintenance  Topic Date Due  . Mammogram  01/16/2019  . Tetanus Vaccine  07/01/2019*  . Flu Shot  Completed  . DEXA scan (bone density measurement)  Completed  . Pneumonia vaccines  Completed  *Topic was postponed. The date shown is not the original due date.

## 2019-05-11 NOTE — Progress Notes (Addendum)
Subjective:   Bridget Briggs is a 85 y.o. female who presents for Medicare Annual (Subsequent) preventive examination.  Review of Systems:  No ROS.  Medicare Wellness Virtual Visit.  Visual/audio telehealth visit, UTA vital signs.   Wt/Ht provided per patient.  See social history for additional risk factors.   Cardiac Risk Factors include: advanced age (>69men, >57 women)     Objective:     Vitals: Ht 5' 3.75" (1.619 m)   Wt 182 lb (82.6 kg)   BMI 31.49 kg/m   Body mass index is 31.49 kg/m.  Advanced Directives 05/11/2019 05/07/2018 05/06/2017 04/25/2016 09/06/2015 06/08/2015 04/26/2015  Does Patient Have a Medical Advance Directive? Yes Yes Yes Yes Yes Yes Yes  Type of Advance Directive Living will [place];Living will Living will [place];Living will - Living will [place];Living will  Does patient want to make changes to medical advance directive? No - Patient declined No - Patient declined No - Patient declined - No - Patient declined No - Patient declined -  Copy of [place] in Chart? - Yes - validated most recent copy scanned in chart (See row information) - No - copy requested - No - copy requested No - copy requested    Tobacco Social History   Tobacco Use  Smoking Status Former Smoker  . Packs/day: 1.00  . Years: 30.00  . Pack years: 30.00  . Types: Cigarettes  . Quit date: 05/20/1988  . Years since quitting: 30.9  Smokeless Tobacco Never Used     Counseling given: Not Answered   Clinical Intake:  Pre-visit preparation completed: Yes        Diabetes: No  How often do you need to have someone help you when you read instructions, pamphlets, or other written materials from your doctor or pharmacy?: 1 - Never  Interpreter Needed?: No     Past Medical History:  Diagnosis Date  . Arthritis   . Depression   . GERD (gastroesophageal reflux disease)   . Glaucoma   .  History of chicken pox   . History of colon polyps   . Hx: UTI (urinary tract infection)    Past Surgical History:  Procedure Laterality Date  . ABDOMINAL HYSTERECTOMY  1974   left ovary removed  . ANKLE SURGERY Left 1998  . APPENDECTOMY  1974  . BREAST BIOPSY Right 1988   NEG  . BREAST SURGERY Right 1988   biopsy  . CARDIAC CATHETERIZATION N/A 09/06/2015   Procedure: Right/Left Heart Cath and Coronary Angiography;  Surgeon: Yolonda Kida, MD;  Location: [facility];  Service: Cardiovascular;  Laterality: N/A;  . CHOLECYSTECTOMY  1996  . EYE SURGERY Bilateral 2005/2006   cataract  . LAPAROSCOPIC GASTRIC BANDING WITH HIATAL HERNIA REPAIR  2000  . NISSEN FUNDOPLICATION  [id number]  . OOPHORECTOMY Left   . TONSILLECTOMY AND ADENOIDECTOMY  1940   Family History  Problem Relation Age of Onset  . Arthritis Mother   . Leukemia Mother   . Lung cancer Father   . Osteoporosis Other   . Breast cancer Neg Hx    Social History   Socioeconomic History  . Marital status: Married    Spouse name: Not on file  . Number of children: Not on file  . Years of education: Not on file  . Highest education level: Not on file  Occupational History  . Not on file  Tobacco Use  . Smoking  status: Former Smoker    Packs/day: 1.00    Years: 30.00    Pack years: 30.00    Types: Cigarettes    Quit date: 05/20/1988    Years since quitting: 30.9  . Smokeless tobacco: Never Used  Substance and Sexual Activity  . Alcohol use: No    Alcohol/week: 0.0 standard drinks  . Drug use: No  . Sexual activity: Never  Other Topics Concern  . Not on file  Social History Narrative  . Not on file   Social Determinants of Health   Financial Resource Strain: Low Risk   . Difficulty of Paying Living Expenses: Not hard at all  Food Insecurity: No Food Insecurity  . Worried About Charity fundraiser in the Last Year: Never true  . Ran Out of Food in the Last Year: Never true  Transportation Needs: No  Transportation Needs  . Lack of Transportation (Medical): No  . Lack of Transportation (Non-Medical): No  Physical Activity: Insufficiently Active  . Days of Exercise per Week: 5 days  . Minutes of Exercise per Session: 20 min  Stress: No Stress Concern Present  . Feeling of Stress : Not at all  Social Connections: Somewhat Isolated  . Frequency of Communication with Friends and Family: Once a week  . Frequency of Social Gatherings with Friends and Family: Once a week  . Attends Religious Services: 1 to 4 times per year  . Active Member of Clubs or Organizations: Yes  . Attends Archivist Meetings: 1 to 4 times per year  . Marital Status: Widowed    Outpatient Encounter Medications as of 05/11/2019  Medication Sig  . apixaban (ELIQUIS) 5 MG TABS tablet Take 1 tablet (5 mg total) by mouth 2 (two) times daily.  . beta carotene w/minerals (OCUVITE) tablet Take 1 tablet by mouth daily.  . calcium-vitamin D (OSCAL WITH D) 500-200 MG-UNIT per tablet Take 1 tablet by mouth daily.  . diphenhydramine-acetaminophen (TYLENOL PM) 25-500 MG TABS Take 1 tablet by mouth at bedtime as needed.  . dorzolamide-timolol (COSOPT) 22.3-6.8 MG/ML ophthalmic solution Place 1 drop into both eyes 2 (two) times daily.  . fluocinolone (SYNALAR) 0.01 % external solution Apply topically 2 (two) times daily as needed.  . latanoprost (XALATAN) 0.005 % ophthalmic solution Place 1 drop into both eyes at bedtime.  . metoprolol tartrate (LOPRESSOR) 25 MG tablet Take 25 mg by mouth 2 (two) times daily.  . vitamin D, CHOLECALCIFEROL, 400 UNITS tablet Take 400 Units by mouth daily.   No facility-administered encounter medications on file as of 05/11/2019.    Activities of Daily Living In your present state of health, do you have any difficulty performing the following activities: 05/11/2019  Hearing? N  Vision? N  Difficulty concentrating or making decisions? N  Walking or climbing stairs? N  Dressing or  bathing? N  Doing errands, shopping? N  Preparing Food and eating ? N  Using the Toilet? N  In the past six months, have you accidently leaked urine? N  Do you have problems with loss of bowel control? N  Managing your Medications? N  Managing your Finances? N  Housekeeping or managing your Housekeeping? N  Some recent data might be hidden    Patient Care Team: Einar Pheasant, MD as PCP - General (Internal Medicine)    Assessment:   This is a routine wellness examination for Prakriti.   Nurse connected with patient 05/11/19 at 10:00 AM EST by a telephone enabled telemedicine  application and verified that I am speaking with the correct person using two identifiers. Patient stated full name and DOB. Patient gave permission to continue with virtual visit. Patient's location was at home and Nurse's location was at [place] office.   Patient is alert and oriented x3. Patient denies difficulty focusing or concentrating. Patient likes to read and complete word puzzles for brain stimulation.   Health Maintenance Due: -Tdap vaccine- discussed; to be completed with doctor in visit or local pharmacy.   -Mammogram- scheduled 05/25/19 See completed HM at the end of note.   Eye: Visual acuity not assessed. Virtual visit. Followed by their ophthalmologist.  Dental: Visits every 6 months.    Hearing: Demonstrates normal hearing during visit.  Safety:  Patient feels safe at home- yes Patient does have smoke detectors at home- yes Patient does wear sunscreen or protective clothing when in direct sunlight - yes Patient does wear seat belt when in a moving vehicle - yes Patient drives- yes Adequate lighting in walkways free from debris- yes Grab bars and handrails used as appropriate- yes Ambulates with an assistive device- no Cell phone on person when ambulating outside of the home- yes  Social: Alcohol intake - no     Smoking history- former  Smokers in home? none Illicit drug use?  none  Medication: Taking as directed and without issues.  Pill box in use -yes  Self managed - yes   Covid-19: Precautions and sickness symptoms discussed. Wears mask, social distancing, hand hygiene as appropriate.   Activities of Daily Living Patient denies needing assistance with: household chores, feeding themselves, getting from bed to chair, getting to the toilet, bathing/showering, dressing, managing money, or preparing meals.   Discussed the importance of a healthy diet, water intake and the benefits of aerobic exercise.  Physical activity- walking daily  Diet:  Regular Water: good intake  Other Providers Patient Care Team: Einar Pheasant, MD as PCP - General (Internal Medicine)   Exercise Activities and Dietary recommendations Current Exercise Habits: Home exercise routine, Type of exercise: walking, Time (Minutes): 20, Frequency (Times/Week): 5, Weekly Exercise (Minutes/Week): 100, Intensity: Mild  Goals      Patient Stated   . I would like to increase activity with play the senior games (pt-stated)       Fall Risk Fall Risk  05/11/2019 02/19/2019 10/09/2018 05/07/2018 05/06/2017  Falls in the past year? 0 0 0 0 No  Number falls in past yr: - - 0 - -  Injury with Fall? - - 0 - -  Follow up Falls prevention discussed Falls evaluation completed Falls evaluation completed - -   Timed Get Up and Go performed: no, virtual visit  Depression Screen PHQ 2/9 Scores 05/11/2019 10/09/2018 05/07/2018 05/06/2017  PHQ - 2 Score 0 0 0 0  PHQ- 9 Score - 0 - -     Cognitive Function MMSE - Mini Mental State Exam 05/06/2017 04/26/2015  Orientation to time 5 5  Orientation to Place 5 5  Registration 3 3  Attention/ Calculation 5 5  Recall 3 3  Language- name 2 objects 2 2  Language- repeat 1 1  Language- follow 3 step command 3 3  Language- read & follow direction 1 1  Write a sentence 1 1  Copy design 1 1  Total score 30 30     6CIT Screen 05/11/2019 05/07/2018  04/25/2016  What Year? 0 points 0 points 0 points  What month? 0 points 0 points 0 points  What  time? 0 points 0 points 0 points  Count back from 20 0 points 0 points 0 points  Months in reverse 0 points 0 points 0 points  Repeat phrase 4 points 0 points -  Total Score 4 0 -    Immunization History  Administered Date(s) Administered  . Fluad Quad(high Dose 65+) 02/19/2019  . Influenza Split 02/22/2013  . Influenza, High Dose Seasonal PF 06/10/2016, 02/27/2017  . Influenza,inj,Quad PF,6+ Mos 01/20/2014, 03/02/2015, 02/13/2018  . Pneumococcal Conjugate-13 01/20/2014  . Pneumococcal Polysaccharide-23 09/12/2016   Screening Tests Health Maintenance  Topic Date Due  . MAMMOGRAM  01/16/2019  . TETANUS/TDAP  07/01/2019 (Originally 01/28/1950)  . INFLUENZA VACCINE  Completed  . DEXA SCAN  Completed  . PNA vac Low Risk Adult  Completed      Plan:   Keep all routine maintenance appointments.   Next scheduled lab 06/29/19 @ 9:00  Cpe 07/01/19 @ 1030  Medicare Attestation I have personally reviewed: The patient's medical and social history Their use of alcohol, tobacco or illicit drugs Their current medications and supplements The patient's functional ability including ADLs,fall risks, home safety risks, cognitive, and hearing and visual impairment Diet and physical activities Evidence for depression   I have reviewed and discussed with patient certain preventive protocols, quality metrics, and best practice recommendations.   Varney Biles, LPN  [id number]   Reviewed above information.  Agree with assessment and plan.    Dr Nicki Reaper

## 2019-05-25 ENCOUNTER — Ambulatory Visit
Admission: RE | Admit: 2019-05-25 | Discharge: 2019-05-25 | Disposition: A | Discharge status: Home / Self Care | Payer: Medicare Other | Source: Ambulatory Visit | Attending: Internal Medicine | Admitting: Internal Medicine

## 2019-05-25 DIAGNOSIS — Z1231 Encounter for screening mammogram for malignant neoplasm of breast: Secondary | ICD-10-CM | POA: Diagnosis not present

## 2019-06-14 DIAGNOSIS — H40153 Residual stage of open-angle glaucoma, bilateral: Secondary | ICD-10-CM | POA: Diagnosis not present

## 2019-06-29 ENCOUNTER — Other Ambulatory Visit: Payer: Self-pay

## 2019-06-29 ENCOUNTER — Other Ambulatory Visit (INDEPENDENT_AMBULATORY_CARE_PROVIDER_SITE_OTHER): Payer: Medicare Other

## 2019-06-29 DIAGNOSIS — I480 Paroxysmal atrial fibrillation: Secondary | ICD-10-CM | POA: Diagnosis not present

## 2019-06-29 DIAGNOSIS — Z23 Encounter for immunization: Secondary | ICD-10-CM

## 2019-06-29 DIAGNOSIS — Z13220 Encounter for screening for lipoid disorders: Secondary | ICD-10-CM | POA: Diagnosis not present

## 2019-06-29 LAB — LIPID PANEL
Cholesterol: 204 mg/dL — ABNORMAL HIGH (ref 0–200)
HDL: 58 mg/dL (ref >39.00)
LDL Cholesterol: 110 mg/dL — ABNORMAL HIGH (ref 0–99)
NonHDL: 146.12
Total CHOL/HDL Ratio: 4
Triglycerides: 183 mg/dL — ABNORMAL HIGH (ref 0.0–149.0)
VLDL: 36.6 mg/dL (ref 0.0–40.0)

## 2019-06-29 LAB — BASIC METABOLIC PANEL
BUN: 16 mg/dL (ref 6–23)
CO2: 31 mEq/L (ref 19–32)
Calcium: 10.1 mg/dL (ref 8.4–10.5)
Chloride: 104 mEq/L (ref 96–112)
Creatinine, Ser: 0.86 mg/dL (ref 0.40–1.20)
GFR: 62.5 mL/min (ref >60.00)
Glucose, Bld: 104 mg/dL — ABNORMAL HIGH (ref 70–99)
Potassium: 4.7 mEq/L (ref 3.5–5.1)
Sodium: 140 mEq/L (ref 135–145)

## 2019-06-29 LAB — HEPATIC FUNCTION PANEL
ALT: 16 U/L (ref 0–35)
AST: 18 U/L (ref 0–37)
Albumin: 4 g/dL (ref 3.5–5.2)
Alkaline Phosphatase: 63 U/L (ref 39–117)
Bilirubin, Direct: 0.1 mg/dL (ref 0.0–0.3)
Total Bilirubin: 0.7 mg/dL (ref 0.2–1.2)
Total Protein: 6.6 g/dL (ref 6.0–8.3)

## 2019-06-30 ENCOUNTER — Other Ambulatory Visit: Payer: Self-pay | Admitting: Internal Medicine

## 2019-06-30 DIAGNOSIS — R944 Abnormal results of kidney function studies: Secondary | ICD-10-CM

## 2019-06-30 NOTE — Progress Notes (Signed)
Orders placed for f/u labs.  

## 2019-07-01 ENCOUNTER — Other Ambulatory Visit: Payer: Self-pay

## 2019-07-01 ENCOUNTER — Ambulatory Visit: Payer: Medicare Other | Admitting: Internal Medicine

## 2019-07-01 DIAGNOSIS — E7800 Pure hypercholesterolemia, unspecified: Secondary | ICD-10-CM | POA: Diagnosis not present

## 2019-07-01 DIAGNOSIS — I480 Paroxysmal atrial fibrillation: Secondary | ICD-10-CM

## 2019-07-01 DIAGNOSIS — J479 Bronchiectasis, uncomplicated: Secondary | ICD-10-CM

## 2019-07-01 DIAGNOSIS — I700 Atherosclerosis of aorta: Secondary | ICD-10-CM

## 2019-07-01 DIAGNOSIS — D649 Anemia, unspecified: Secondary | ICD-10-CM | POA: Diagnosis not present

## 2019-07-01 MED ORDER — ROSUVASTATIN CALCIUM 5 MG PO TABS: ORAL_TABLET | ORAL | 1 refills | Status: DC

## 2019-07-01 NOTE — Progress Notes (Signed)
Patient ID: ANUM Bridget Briggs, female   DOB: [birth year], 86 y.o.   MRN: [id number]   Subjective:    Patient ID: Bridget Briggs, female    DOB: 03/03/1931, 86 y.o.   MRN: [id number]  HPI This visit occurred during the SARS-CoV-2 public health emergency.  Safety protocols were in place, including screening questions prior to the visit, additional usage of staff PPE, and extensive cleaning of exam room while observing appropriate contact time as indicated for disinfecting solutions.  Patient here for a scheduled follow up. She reports she is doing well.  Stays active.  [facility] but [facility] daily.  Walks at the mall or her neighborhood daily.  No chest pain.  Breathing stable.  No increased heart rate or palpitations.  No abdominal pain.  Bowels moving.  Discussed labs. Discussed calculated cholesterol risk. She agrees to start cholesterol medication.    Past Medical History:  Diagnosis Date  . Arthritis   . Depression   . GERD (gastroesophageal reflux disease)   . Glaucoma   . History of chicken pox   . History of colon polyps   . Hx: UTI (urinary tract infection)    Past Surgical History:  Procedure Laterality Date  . ABDOMINAL HYSTERECTOMY  1974   left ovary removed  . ANKLE SURGERY Left 1998  . APPENDECTOMY  1974  . BREAST BIOPSY Right 1988   NEG  . BREAST SURGERY Right 1988   biopsy  . CARDIAC CATHETERIZATION N/A 09/06/2015   Procedure: Right/Left Heart Cath and Coronary Angiography;  Surgeon: Yolonda Kida, MD;  Location: [facility];  Service: Cardiovascular;  Laterality: N/A;  . CHOLECYSTECTOMY  1996  . EYE SURGERY Bilateral 2005/2006   cataract  . LAPAROSCOPIC GASTRIC BANDING WITH HIATAL HERNIA REPAIR  2000  . NISSEN FUNDOPLICATION  99991111  . OOPHORECTOMY Left   . TONSILLECTOMY AND ADENOIDECTOMY  1940   Family History  Problem Relation Age of Onset  . Arthritis Mother   . Leukemia Mother   . Lung cancer Father   . Osteoporosis Other   .  Breast cancer Neg Hx    Social History   Socioeconomic History  . Marital status: Married    Spouse name: Not on file  . Number of children: Not on file  . Years of education: Not on file  . Highest education level: Not on file  Occupational History  . Not on file  Tobacco Use  . Smoking status: Former Smoker    Packs/day: 1.00    Years: 30.00    Pack years: 30.00    Types: Cigarettes    Quit date: 05/20/1988    Years since quitting: 31.1  . Smokeless tobacco: Never Used  Substance and Sexual Activity  . Alcohol use: No    Alcohol/week: 0.0 standard drinks  . Drug use: No  . Sexual activity: Never  Other Topics Concern  . Not on file  Social History Narrative  . Not on file   Social Determinants of Health   Financial Resource Strain: Low Risk   . Difficulty of Paying Living Expenses: Not hard at all  Food Insecurity: No Food Insecurity  . Worried About Charity fundraiser in the Last Year: Never true  . Ran Out of Food in the Last Year: Never true  Transportation Needs: No Transportation Needs  . Lack of Transportation (Medical): No  . Lack of Transportation (Non-Medical): No  Physical Activity: Insufficiently Active  .  Days of Exercise per Week: 5 days  . Minutes of Exercise per Session: 20 min  Stress: No Stress Concern Present  . Feeling of Stress : Not at all  Social Connections: Somewhat Isolated  . Frequency of Communication with Friends and Family: Once a week  . Frequency of Social Gatherings with Friends and Family: Once a week  . Attends Religious Services: 1 to 4 times per year  . Active Member of Clubs or Organizations: Yes  . Attends Archivist Meetings: 1 to 4 times per year  . Marital Status: Widowed    Outpatient Encounter Medications as of 07/01/2019  Medication Sig  . apixaban (ELIQUIS) 5 MG TABS tablet Take 1 tablet (5 mg total) by mouth 2 (two) times daily.  . beta carotene w/minerals (OCUVITE) tablet Take 1 tablet by mouth daily.    . calcium-vitamin D (OSCAL WITH D) 500-200 MG-UNIT per tablet Take 1 tablet by mouth daily.  . diphenhydramine-acetaminophen (TYLENOL PM) 25-500 MG TABS Take 1 tablet by mouth at bedtime as needed.  . dorzolamide-timolol (COSOPT) 22.3-6.8 MG/ML ophthalmic solution Place 1 drop into both eyes 2 (two) times daily.  . fluocinolone (SYNALAR) 0.01 % external solution Apply topically 2 (two) times daily as needed.  . latanoprost (XALATAN) 0.005 % ophthalmic solution Place 1 drop into both eyes at bedtime.  . metoprolol tartrate (LOPRESSOR) 25 MG tablet Take 25 mg by mouth 2 (two) times daily.  . rosuvastatin (CRESTOR) 5 MG tablet Take one tablet q Monday, Wednesday and Friday.  . vitamin D, CHOLECALCIFEROL, 400 UNITS tablet Take 400 Units by mouth daily.   No facility-administered encounter medications on file as of 07/01/2019.   Review of Systems  Constitutional: Negative for appetite change and unexpected weight change.  HENT: Negative for congestion and sinus pressure.   Respiratory: Negative for cough, chest tightness and shortness of breath.   Cardiovascular: Negative for chest pain, palpitations and leg swelling.  Gastrointestinal: Negative for abdominal pain, diarrhea, nausea and vomiting.  Genitourinary: Negative for difficulty urinating and dysuria.  Musculoskeletal: Negative for joint swelling and myalgias.  Skin: Negative for color change and rash.  Neurological: Negative for dizziness, light-headedness and headaches.  Psychiatric/Behavioral: Negative for agitation and dysphoric mood.       Objective:    Physical Exam Constitutional:      General: She is not in acute distress.    Appearance: Normal appearance.  HENT:     Head: Normocephalic and atraumatic.     Right Ear: External ear normal.     Left Ear: External ear normal.  Eyes:     General: No scleral icterus.       Right eye: No discharge.        Left eye: No discharge.     Conjunctiva/sclera: Conjunctivae normal.   Neck:     Thyroid: No thyromegaly.  Cardiovascular:     Rate and Rhythm: Normal rate and regular rhythm.  Pulmonary:     Effort: No respiratory distress.     Breath sounds: Normal breath sounds. No wheezing.  Abdominal:     General: Bowel sounds are normal.     Palpations: Abdomen is soft.     Tenderness: There is no abdominal tenderness.  Musculoskeletal:        General: No swelling or tenderness.     Cervical back: Neck supple. No tenderness.  Lymphadenopathy:     Cervical: No cervical adenopathy.  Skin:    Findings: No erythema or rash.  Neurological:  Mental Status: She is alert.  Psychiatric:        Mood and Affect: Mood normal.        Behavior: Behavior normal.     BP 122/76   Pulse (!) 51   Temp 97.7 F (36.5 C)   Resp 16   Wt 187 lb (84.8 kg)   SpO2 97%   BMI 32.35 kg/m  Wt Readings from Last 3 Encounters:  07/01/19 187 lb (84.8 kg)  05/11/19 182 lb (82.6 kg)  02/19/19 182 lb 12.8 oz (82.9 kg)     Lab Results  Component Value Date   WBC 6.2 12/10/2018   HGB 14.4 12/10/2018   HCT 43.9 12/10/2018   PLT 217.0 12/10/2018   GLUCOSE 104 (H) 06/29/2019   CHOL 204 (H) 06/29/2019   TRIG 183.0 (H) 06/29/2019   HDL 58.00 06/29/2019   LDLCALC 110 (H) 06/29/2019   ALT 16 06/29/2019   AST 18 06/29/2019   NA 140 06/29/2019   K 4.7 06/29/2019   CL 104 06/29/2019   CREATININE 0.86 06/29/2019   BUN 16 06/29/2019   CO2 31 06/29/2019   TSH 4.12 12/10/2018    MM 3D SCREEN BREAST BILATERAL  Result Date: 05/25/2019 CLINICAL DATA:  Screening. EXAM: DIGITAL SCREENING BILATERAL MAMMOGRAM WITH TOMO AND CAD COMPARISON:  Previous exam(s). ACR Breast Density Category b: There are scattered areas of fibroglandular density. FINDINGS: There are no findings suspicious for malignancy. Images were processed with CAD. IMPRESSION: No mammographic evidence of malignancy. A result letter of this screening mammogram will be mailed directly to the patient. RECOMMENDATION:  Screening mammogram in one year. (Code:SM-B-01Y) BI-RADS CATEGORY  1: Negative. Electronically Signed   By: Curlene Dolphin M.D.   On: 05/25/2019 14:46       Assessment & Plan:   Problem List Items Addressed This Visit    Anemia    Follow cbc.       Aortic atherosclerosis ([place])    Agreed to start crestor.        Relevant Medications   rosuvastatin (CRESTOR) 5 MG tablet   Atrial fibrillation (HCC)    Doing well.  Followed by cardiology.  Remains on eliquis.       Relevant Medications   rosuvastatin (CRESTOR) 5 MG tablet   Bronchiectasis without acute exacerbation (HCC)    Followed by pulmonary.  Breathing stable.        Hypercholesterolemia    Low cholesterol diet and exercise.  Discussed calculated cholesterol risk.  Discussed recommendation to start cholesterol medication.  She agrees to start low dose.  Will start crestor 5mg  3x/week. Follow lipid panel.       Relevant Medications   rosuvastatin (CRESTOR) 5 MG tablet   Other Relevant Orders   Hepatic function panel       Einar Pheasant, MD

## 2019-07-04 ENCOUNTER — Encounter: Payer: Self-pay | Admitting: Internal Medicine

## 2019-07-04 NOTE — Assessment & Plan Note (Signed)
Follow cbc.  

## 2019-07-04 NOTE — Assessment & Plan Note (Signed)
Followed by pulmonary.  Breathing stable.   

## 2019-07-04 NOTE — Assessment & Plan Note (Addendum)
Agreed to start crestor.

## 2019-07-04 NOTE — Assessment & Plan Note (Addendum)
Low cholesterol diet and exercise.  Discussed calculated cholesterol risk.  Discussed recommendation to start cholesterol medication.  She agrees to start low dose.  Will start crestor 5mg  3x/week. Follow lipid panel.

## 2019-07-04 NOTE — Assessment & Plan Note (Signed)
Doing well.  Followed by cardiology.  Remains on eliquis.

## 2019-08-18 ENCOUNTER — Telehealth: Payer: Self-pay | Admitting: Internal Medicine

## 2019-08-18 NOTE — Telephone Encounter (Signed)
Unable to lm to schedule the following appts  Yearly follow up in July with fasting Labs before  Non fasting labs anytime now

## 2019-09-22 DIAGNOSIS — H40153 Residual stage of open-angle glaucoma, bilateral: Secondary | ICD-10-CM | POA: Diagnosis not present

## 2019-09-30 DIAGNOSIS — I480 Paroxysmal atrial fibrillation: Secondary | ICD-10-CM | POA: Diagnosis not present

## 2019-09-30 DIAGNOSIS — I208 Other forms of angina pectoris: Secondary | ICD-10-CM | POA: Diagnosis not present

## 2019-09-30 DIAGNOSIS — R0600 Dyspnea, unspecified: Secondary | ICD-10-CM | POA: Diagnosis not present

## 2019-09-30 DIAGNOSIS — R002 Palpitations: Secondary | ICD-10-CM | POA: Diagnosis not present

## 2019-11-23 ENCOUNTER — Other Ambulatory Visit: Payer: Self-pay | Admitting: Internal Medicine

## 2019-11-24 ENCOUNTER — Ambulatory Visit: Payer: Medicare Other | Admitting: Internal Medicine

## 2019-11-24 ENCOUNTER — Other Ambulatory Visit: Payer: Self-pay

## 2019-11-24 DIAGNOSIS — E7800 Pure hypercholesterolemia, unspecified: Secondary | ICD-10-CM

## 2019-11-24 DIAGNOSIS — I480 Paroxysmal atrial fibrillation: Secondary | ICD-10-CM | POA: Diagnosis not present

## 2019-11-24 DIAGNOSIS — R079 Chest pain, unspecified: Secondary | ICD-10-CM | POA: Diagnosis not present

## 2019-11-24 DIAGNOSIS — E538 Deficiency of other specified B group vitamins: Secondary | ICD-10-CM | POA: Diagnosis not present

## 2019-11-24 DIAGNOSIS — J479 Bronchiectasis, uncomplicated: Secondary | ICD-10-CM

## 2019-11-24 DIAGNOSIS — H539 Unspecified visual disturbance: Secondary | ICD-10-CM

## 2019-11-24 DIAGNOSIS — I700 Atherosclerosis of aorta: Secondary | ICD-10-CM

## 2019-11-24 DIAGNOSIS — D649 Anemia, unspecified: Secondary | ICD-10-CM

## 2019-11-24 DIAGNOSIS — I6529 Occlusion and stenosis of unspecified carotid artery: Secondary | ICD-10-CM

## 2019-11-24 LAB — CBC WITH DIFFERENTIAL/PLATELET
Basophils Absolute: 0.1 10*3/uL (ref 0.0–0.1)
Basophils Relative: 0.7 % (ref 0.0–3.0)
Eosinophils Absolute: 0.1 10*3/uL (ref 0.0–0.7)
Eosinophils Relative: 0.9 % (ref 0.0–5.0)
HCT: 41.8 % (ref 36.0–46.0)
Hemoglobin: 13.9 g/dL (ref 12.0–15.0)
Lymphocytes Relative: 29.3 % (ref 12.0–46.0)
Lymphs Abs: 2 10*3/uL (ref 0.7–4.0)
MCHC: 33.1 g/dL (ref 30.0–36.0)
MCV: 86.6 fl (ref 78.0–100.0)
Monocytes Absolute: 0.5 10*3/uL (ref 0.1–1.0)
Monocytes Relative: 6.9 % (ref 3.0–12.0)
Neutro Abs: 4.3 10*3/uL (ref 1.4–7.7)
Neutrophils Relative %: 62.2 % (ref 43.0–77.0)
Platelets: 204 10*3/uL (ref 150.0–400.0)
RBC: 4.83 Mil/uL (ref 3.87–5.11)
RDW: 14.3 % (ref 11.5–15.5)
WBC: 7 10*3/uL (ref 4.0–10.5)

## 2019-11-24 LAB — LIPID PANEL
Cholesterol: 152 mg/dL (ref 0–200)
HDL: 56.5 mg/dL (ref >39.00)
LDL Cholesterol: 69 mg/dL (ref 0–99)
NonHDL: 95.66
Total CHOL/HDL Ratio: 3
Triglycerides: 131 mg/dL (ref 0.0–149.0)
VLDL: 26.2 mg/dL (ref 0.0–40.0)

## 2019-11-24 LAB — BASIC METABOLIC PANEL
BUN: 16 mg/dL (ref 6–23)
CO2: 28 mEq/L (ref 19–32)
Calcium: 9.9 mg/dL (ref 8.4–10.5)
Chloride: 102 mEq/L (ref 96–112)
Creatinine, Ser: 0.8 mg/dL (ref 0.40–1.20)
GFR: 67.88 mL/min (ref >60.00)
Glucose, Bld: 105 mg/dL — ABNORMAL HIGH (ref 70–99)
Potassium: 4.7 mEq/L (ref 3.5–5.1)
Sodium: 136 mEq/L (ref 135–145)

## 2019-11-24 LAB — HEPATIC FUNCTION PANEL
ALT: 17 U/L (ref 0–35)
AST: 20 U/L (ref 0–37)
Albumin: 4.2 g/dL (ref 3.5–5.2)
Alkaline Phosphatase: 58 U/L (ref 39–117)
Bilirubin, Direct: 0.1 mg/dL (ref 0.0–0.3)
Total Bilirubin: 0.9 mg/dL (ref 0.2–1.2)
Total Protein: 6.8 g/dL (ref 6.0–8.3)

## 2019-11-24 LAB — TSH: TSH: 3.99 u[IU]/mL (ref 0.35–4.50)

## 2019-11-24 LAB — VITAMIN B12: Vitamin B-12: 177 pg/mL — ABNORMAL LOW (ref 211–911)

## 2019-11-24 NOTE — Patient Instructions (Signed)
Appointment 11/26/19 at 9:15.

## 2019-11-24 NOTE — Progress Notes (Signed)
Patient ID: Bridget Briggs, female   DOB: 26-Dec-1930, 86 y.o.   MRN: [id number]   Subjective:    Patient ID: Bridget Briggs, female    DOB: [birth year], 86 y.o.   MRN: [id number]  HPI This visit occurred during the SARS-CoV-2 public health emergency.  Safety protocols were in place, including screening questions prior to the visit, additional usage of staff PPE, and extensive cleaning of exam room while observing appropriate contact time as indicated for disinfecting solutions.  Patient here for a scheduled follow up.  She reports noticing recently what appears to be a gray or black paper that goes across her eyes.  Only lasts for a few seconds.  When occurs, she feels like she is going to pass out.  Can occur when standing or sitting.  Had approximately 10 episodes previously, but has not occurred over the last month.  She was having previous chest pain as well.  What she described to me was an episode of chest pain.  Saw cardiology (they documented intermittent chest pain).  Had recommended lexiscan/myoview and echo.  She apparently canceled these and they have not been rescheduled.  Denies any chest pain currently and states this appears to be better as well.  Breathing overall stable.  No other neurological changes noted with the vision change.  She reports is both eyes.  No increased cough or congestion.  She is eating.  No nausea or vomiting.  Bowels moving.  Lives by herself.  Denies headache or dizziness.    Past Medical History:  Diagnosis Date  . Arthritis   . Depression   . GERD (gastroesophageal reflux disease)   . Glaucoma   . History of chicken pox   . History of colon polyps   . Hx: UTI (urinary tract infection)    Past Surgical History:  Procedure Laterality Date  . ABDOMINAL HYSTERECTOMY  1974   left ovary removed  . ANKLE SURGERY Left 1998  . APPENDECTOMY  1974  . BREAST BIOPSY Right 1988   NEG  . BREAST SURGERY Right 1988   biopsy  . CARDIAC CATHETERIZATION N/A  09/06/2015   Procedure: Right/Left Heart Cath and Coronary Angiography;  Surgeon: Yolonda Kida, MD;  Location: [facility];  Service: Cardiovascular;  Laterality: N/A;  . CHOLECYSTECTOMY  1996  . EYE SURGERY Bilateral 2005/2006   cataract  . LAPAROSCOPIC GASTRIC BANDING WITH HIATAL HERNIA REPAIR  2000  . NISSEN FUNDOPLICATION  7867  . OOPHORECTOMY Left   . TONSILLECTOMY AND ADENOIDECTOMY  1940   Family History  Problem Relation Age of Onset  . Arthritis Mother   . Leukemia Mother   . Lung cancer Father   . Osteoporosis Other   . Breast cancer Neg Hx    Social History   Socioeconomic History  . Marital status: Married    Spouse name: Not on file  . Number of children: Not on file  . Years of education: Not on file  . Highest education level: Not on file  Occupational History  . Not on file  Tobacco Use  . Smoking status: Former Smoker    Packs/day: 1.00    Years: 30.00    Pack years: 30.00    Types: Cigarettes    Quit date: 05/20/1988    Years since quitting: 31.5  . Smokeless tobacco: Never Used  Vaping Use  . Vaping Use: Never used  Substance and Sexual Activity  . Alcohol use: No    Alcohol/week: 0.0 standard  drinks  . Drug use: No  . Sexual activity: Never  Other Topics Concern  . Not on file  Social History Narrative  . Not on file   Social Determinants of Health   Financial Resource Strain: Low Risk   . Difficulty of Paying Living Expenses: Not hard at all  Food Insecurity: No Food Insecurity  . Worried About Charity fundraiser in the Last Year: Never true  . Ran Out of Food in the Last Year: Never true  Transportation Needs: No Transportation Needs  . Lack of Transportation (Medical): No  . Lack of Transportation (Non-Medical): No  Physical Activity: Insufficiently Active  . Days of Exercise per Week: 5 days  . Minutes of Exercise per Session: 20 min  Stress: No Stress Concern Present  . Feeling of Stress : Not at all  Social  Connections: Moderately Isolated  . Frequency of Communication with Friends and Family: Once a week  . Frequency of Social Gatherings with Friends and Family: Once a week  . Attends Religious Services: 1 to 4 times per year  . Active Member of Clubs or Organizations: Yes  . Attends Archivist Meetings: 1 to 4 times per year  . Marital Status: Widowed    Outpatient Encounter Medications as of 11/24/2019  Medication Sig  . apixaban (ELIQUIS) 5 MG TABS tablet Take 1 tablet (5 mg total) by mouth 2 (two) times daily.  . beta carotene w/minerals (OCUVITE) tablet Take 1 tablet by mouth daily.  . calcium-vitamin D (OSCAL WITH D) 500-200 MG-UNIT per tablet Take 1 tablet by mouth daily.  . diphenhydramine-acetaminophen (TYLENOL PM) 25-500 MG TABS Take 1 tablet by mouth at bedtime as needed.  . dorzolamide-timolol (COSOPT) 22.3-6.8 MG/ML ophthalmic solution Place 1 drop into both eyes 2 (two) times daily.  . fluocinolone (SYNALAR) 0.01 % external solution Apply topically 2 (two) times daily as needed.  . latanoprost (XALATAN) 0.005 % ophthalmic solution Place 1 drop into both eyes at bedtime.  . metoprolol tartrate (LOPRESSOR) 25 MG tablet Take 25 mg by mouth 2 (two) times daily.  . rosuvastatin (CRESTOR) 5 MG tablet TAKE 1 TABLET BY MOUTH EVERY MONDAY, WEDNESDAY AND FRIDAY  . vitamin D, CHOLECALCIFEROL, 400 UNITS tablet Take 400 Units by mouth daily.   No facility-administered encounter medications on file as of 11/24/2019.    Review of Systems  Constitutional: Negative for appetite change and unexpected weight change.  HENT: Negative for congestion and sinus pressure.   Eyes:       Previous vision change as outlined.   Respiratory: Negative for cough, chest tightness and shortness of breath.   Cardiovascular: Negative for palpitations and leg swelling.       Previous chest pain as outlined.    Gastrointestinal: Negative for abdominal pain, diarrhea, nausea and vomiting.  Genitourinary:  Negative for difficulty urinating and dysuria.  Musculoskeletal: Negative for joint swelling and myalgias.  Skin: Negative for color change and rash.  Neurological: Negative for dizziness, light-headedness and headaches.  Psychiatric/Behavioral: Negative for agitation and dysphoric mood.       Objective:    Physical Exam Vitals reviewed.  Constitutional:      General: She is not in acute distress.    Appearance: Normal appearance.  HENT:     Head: Normocephalic and atraumatic.     Right Ear: External ear normal.     Left Ear: External ear normal.  Eyes:     General: No scleral icterus.  Right eye: No discharge.        Left eye: No discharge.     Conjunctiva/sclera: Conjunctivae normal.  Neck:     Thyroid: No thyromegaly.  Cardiovascular:     Rate and Rhythm: Normal rate and regular rhythm.  Pulmonary:     Effort: No respiratory distress.     Breath sounds: Normal breath sounds. No wheezing.  Abdominal:     General: Bowel sounds are normal.     Palpations: Abdomen is soft.     Tenderness: There is no abdominal tenderness.  Musculoskeletal:        General: No swelling or tenderness.     Cervical back: Neck supple. No tenderness.  Lymphadenopathy:     Cervical: No cervical adenopathy.  Skin:    Findings: No erythema or rash.  Neurological:     Mental Status: She is alert.     Comments: Answering questions appropriately.  Did repeat herself on multiple occasions today.    Psychiatric:        Mood and Affect: Mood normal.        Behavior: Behavior normal.     BP 118/78   Pulse (!) 50   Temp 98 F (36.7 C)   Resp 16   Ht 5\' 4"  (1.626 m)   Wt 186 lb (84.4 kg)   SpO2 97%   BMI 31.93 kg/m  Wt Readings from Last 3 Encounters:  11/24/19 186 lb (84.4 kg)  07/01/19 187 lb (84.8 kg)  05/11/19 182 lb (82.6 kg)     Lab Results  Component Value Date   WBC 7.0 11/24/2019   HGB 13.9 11/24/2019   HCT 41.8 11/24/2019   PLT 204.0 11/24/2019   GLUCOSE 105 (H)  11/24/2019   CHOL 152 11/24/2019   TRIG 131.0 11/24/2019   HDL 56.50 11/24/2019   LDLCALC 69 11/24/2019   ALT 17 11/24/2019   AST 20 11/24/2019   NA 136 11/24/2019   K 4.7 11/24/2019   CL 102 11/24/2019   CREATININE 0.80 11/24/2019   BUN 16 11/24/2019   CO2 28 11/24/2019   TSH 3.99 11/24/2019    MM 3D SCREEN BREAST BILATERAL  Result Date: 05/25/2019 CLINICAL DATA:  Screening. EXAM: DIGITAL SCREENING BILATERAL MAMMOGRAM WITH TOMO AND CAD COMPARISON:  Previous exam(s). ACR Breast Density Category b: There are scattered areas of fibroglandular density. FINDINGS: There are no findings suspicious for malignancy. Images were processed with CAD. IMPRESSION: No mammographic evidence of malignancy. A result letter of this screening mammogram will be mailed directly to the patient. RECOMMENDATION: Screening mammogram in one year. (Code:SM-B-01Y) BI-RADS CATEGORY  1: Negative. Electronically Signed   By: Curlene Dolphin M.D.   On: 05/25/2019 14:46       Assessment & Plan:   Problem List Items Addressed This Visit    Anemia    Recheck cbc today to confirm wnl.       Relevant Orders   CBC with Differential/Platelet (Completed)   Aortic atherosclerosis ([place])    Started crestor last visit.        Relevant Orders   Hepatic function panel (Completed)   Lipid panel (Completed)   Basic metabolic panel (Completed)   Atrial fibrillation ([place])    Has been followed by cardiology.  On eliquis.  In SR today.  On metoprolol.  Follow.        Relevant Orders   TSH (Completed)   B12 deficiency    Recheck B12 level.  Relevant Orders   Vitamin B12 (Completed)   Bronchiectasis without acute exacerbation ([place])    Has previously  been followed by pulmonary.  Breathing stable.  Will obtain cxr .        Carotid stenosis    Documented history.  Given above episodes of near syncope, will obtain carotid ultrasound to evaluate vertebral artery flow as well.        Relevant Orders   VAS US  CAROTID   Change in vision    Change in vision as outlined.  Describes previous intermittent episodes of a gray or black paper over her eyes.  Only last for a few seconds.  Will then feel like she is going to pass out.  If standing will sit down and it passes.  States can occur when sitting. Reports last episode was a month ago.  Was having previous chest pain as well.  Already on eliquis.  Cardiology appt and w/up as outlined.  D/w neurology.  Will f/u with neurology.  Continue eliquis. Check carotid ultrasound to evaluate vertebral artery flow.  Routine labs as outlined.        Relevant Orders   VAS US CAROTID   Chest pain    Had the previous chest pain as outlined.  Given the intermittent episodes of near syncope, EKG repeated today.  EKG revealed SR/SB with no acute ischemic changes.  Ventricular rate 50.  Unclear etiology.  Discussed possible cardiac origin.  Has recently seen cardiology with recommendations for lexiscan/myoview and echo.  Needs to get these rescheduled.  Also discussed possible heart rhythm issue with bradycardia noted on EKG.  Discussed f/u with cardiology and need for further w/up as outlined with question of need for cardiac monitor.  appt made with cardiology.  Pt instructed not to drive until above sorted through.  Sister-n-law called and drove pt home.        Relevant Orders   EKG 12-Lead (Completed)   Hypercholesterolemia    Low cholesterol diet and exercise.  On crestor.  Follow lipid panel and liver function tests.           I spent over 45 minutes with the patient and more than 50% of the time was spent in consultation regarding the above.  Time spent obtaining her history and discussing her current concerns and symptoms.  Time also spent discussing with her and her sister-n-law the plan for further w/up and treatment.     Einar Pheasant, MD

## 2019-11-25 ENCOUNTER — Telehealth: Payer: Self-pay | Admitting: Internal Medicine

## 2019-11-25 NOTE — Telephone Encounter (Signed)
Spoke with patients son with patients consent and gave update about appt. Patient is seeing cardiology tomorrow and also seeing neurology on 7/20. Pt was advised not to drive until consulting with Dr Clayborn Bigness tomorrow.

## 2019-11-25 NOTE — Telephone Encounter (Signed)
Pt son called wanting to get info on his mothers appt from yesterday

## 2019-11-26 ENCOUNTER — Encounter: Payer: Self-pay | Admitting: Internal Medicine

## 2019-11-26 DIAGNOSIS — H539 Unspecified visual disturbance: Secondary | ICD-10-CM | POA: Insufficient documentation

## 2019-11-26 DIAGNOSIS — R001 Bradycardia, unspecified: Secondary | ICD-10-CM | POA: Diagnosis not present

## 2019-11-26 DIAGNOSIS — R55 Syncope and collapse: Secondary | ICD-10-CM | POA: Diagnosis not present

## 2019-11-26 DIAGNOSIS — R0600 Dyspnea, unspecified: Secondary | ICD-10-CM | POA: Diagnosis not present

## 2019-11-26 DIAGNOSIS — I208 Other forms of angina pectoris: Secondary | ICD-10-CM | POA: Diagnosis not present

## 2019-11-26 DIAGNOSIS — I480 Paroxysmal atrial fibrillation: Secondary | ICD-10-CM | POA: Diagnosis not present

## 2019-11-26 NOTE — Assessment & Plan Note (Signed)
Has been followed by cardiology.  On eliquis.  In SR today.  On metoprolol.  Follow.

## 2019-11-26 NOTE — Assessment & Plan Note (Signed)
Documented history.  Given above episodes of near syncope, will obtain carotid ultrasound to evaluate vertebral artery flow as well.

## 2019-11-26 NOTE — Assessment & Plan Note (Signed)
Low cholesterol diet and exercise.  On crestor.  Follow lipid panel and liver function tests.  

## 2019-11-26 NOTE — Assessment & Plan Note (Signed)
Had the previous chest pain as outlined.  Given the intermittent episodes of near syncope, EKG repeated today.  EKG revealed SR/SB with no acute ischemic changes.  Ventricular rate 50.  Unclear etiology.  Discussed possible cardiac origin.  Has recently seen cardiology with recommendations for lexiscan/myoview and echo.  Needs to get these rescheduled.  Also discussed possible heart rhythm issue with bradycardia noted on EKG.  Discussed f/u with cardiology and need for further w/up as outlined with question of need for cardiac monitor.  appt made with cardiology.  Pt instructed not to drive until above sorted through.  Sister-n-law called and drove pt home.

## 2019-11-26 NOTE — Assessment & Plan Note (Addendum)
Has previously  been followed by pulmonary.  Breathing stable.  Will obtain cxr .

## 2019-11-26 NOTE — Assessment & Plan Note (Signed)
Started crestor last visit.

## 2019-11-26 NOTE — Assessment & Plan Note (Signed)
Change in vision as outlined.  Describes previous intermittent episodes of a gray or black paper over her eyes.  Only last for a few seconds.  Will then feel like she is going to pass out.  If standing will sit down and it passes.  States can occur when sitting. Reports last episode was a month ago.  Was having previous chest pain as well.  Already on eliquis.  Cardiology appt and w/up as outlined.  D/w neurology.  Will f/u with neurology.  Continue eliquis. Check carotid ultrasound to evaluate vertebral artery flow.  Routine labs as outlined.

## 2019-11-26 NOTE — Assessment & Plan Note (Signed)
Recheck cbc today to confirm wnl.  

## 2019-11-26 NOTE — Assessment & Plan Note (Signed)
Recheck B12 level 

## 2019-11-30 ENCOUNTER — Other Ambulatory Visit: Payer: Self-pay

## 2019-11-30 ENCOUNTER — Ambulatory Visit (INDEPENDENT_AMBULATORY_CARE_PROVIDER_SITE_OTHER): Payer: Medicare Other

## 2019-11-30 DIAGNOSIS — E538 Deficiency of other specified B group vitamins: Secondary | ICD-10-CM | POA: Diagnosis not present

## 2019-11-30 MED ORDER — CYANOCOBALAMIN 1000 MCG/ML IJ SOLN
1000.0000 ug | Freq: Once | INTRAMUSCULAR | Status: AC
  Administered 2019-11-30: 1000 ug via INTRAMUSCULAR

## 2019-11-30 NOTE — Progress Notes (Signed)
Patient presented for B 12 injection to left deltoid, patient voiced no concerns nor showed any signs of distress during injection. 

## 2019-12-07 DIAGNOSIS — F0390 Unspecified dementia without behavioral disturbance: Secondary | ICD-10-CM | POA: Diagnosis not present

## 2019-12-07 DIAGNOSIS — R2689 Other abnormalities of gait and mobility: Secondary | ICD-10-CM | POA: Diagnosis not present

## 2019-12-07 DIAGNOSIS — R4189 Other symptoms and signs involving cognitive functions and awareness: Secondary | ICD-10-CM | POA: Diagnosis not present

## 2019-12-07 DIAGNOSIS — I480 Paroxysmal atrial fibrillation: Secondary | ICD-10-CM | POA: Diagnosis not present

## 2019-12-08 ENCOUNTER — Other Ambulatory Visit: Payer: Self-pay | Admitting: Neurology

## 2019-12-08 ENCOUNTER — Other Ambulatory Visit: Payer: Self-pay

## 2019-12-08 ENCOUNTER — Ambulatory Visit (INDEPENDENT_AMBULATORY_CARE_PROVIDER_SITE_OTHER): Payer: Medicare Other

## 2019-12-08 DIAGNOSIS — R4189 Other symptoms and signs involving cognitive functions and awareness: Secondary | ICD-10-CM

## 2019-12-08 DIAGNOSIS — E538 Deficiency of other specified B group vitamins: Secondary | ICD-10-CM

## 2019-12-08 MED ORDER — CYANOCOBALAMIN 1000 MCG/ML IJ SOLN
1000.0000 ug | Freq: Once | INTRAMUSCULAR | Status: AC
  Administered 2019-12-08: 1000 ug via INTRAMUSCULAR

## 2019-12-08 NOTE — Progress Notes (Signed)
Patient presented for B 12 injection to left deltoid, patient voiced no concerns nor showed any signs of distress during injection. 

## 2019-12-14 ENCOUNTER — Other Ambulatory Visit: Payer: Self-pay

## 2019-12-14 ENCOUNTER — Ambulatory Visit (INDEPENDENT_AMBULATORY_CARE_PROVIDER_SITE_OTHER): Payer: Medicare Other

## 2019-12-14 DIAGNOSIS — E538 Deficiency of other specified B group vitamins: Secondary | ICD-10-CM | POA: Diagnosis not present

## 2019-12-14 MED ORDER — CYANOCOBALAMIN 1000 MCG/ML IJ SOLN
1000.0000 ug | Freq: Once | INTRAMUSCULAR | Status: AC
  Administered 2019-12-14: 1000 ug via INTRAMUSCULAR

## 2019-12-14 NOTE — Progress Notes (Addendum)
Patient presented for B 12 injection to left deltoid, patient voiced no concerns nor showed any signs of distress during injection.  Reviewed.  Dr Scott 

## 2019-12-20 ENCOUNTER — Other Ambulatory Visit: Payer: Self-pay

## 2019-12-20 ENCOUNTER — Ambulatory Visit (INDEPENDENT_AMBULATORY_CARE_PROVIDER_SITE_OTHER): Payer: Medicare Other

## 2019-12-20 ENCOUNTER — Encounter (INDEPENDENT_AMBULATORY_CARE_PROVIDER_SITE_OTHER): Payer: Self-pay

## 2019-12-20 ENCOUNTER — Encounter (INDEPENDENT_AMBULATORY_CARE_PROVIDER_SITE_OTHER): Payer: Self-pay | Admitting: Vascular Surgery

## 2019-12-20 ENCOUNTER — Ambulatory Visit (INDEPENDENT_AMBULATORY_CARE_PROVIDER_SITE_OTHER): Payer: Medicare Other | Admitting: Vascular Surgery

## 2019-12-20 VITALS — BP 126/75 | HR 52 | Resp 16 | Ht 62.0 in | Wt 181.8 lb

## 2019-12-20 DIAGNOSIS — I6529 Occlusion and stenosis of unspecified carotid artery: Secondary | ICD-10-CM

## 2019-12-20 DIAGNOSIS — H539 Unspecified visual disturbance: Secondary | ICD-10-CM | POA: Diagnosis not present

## 2019-12-20 DIAGNOSIS — I480 Paroxysmal atrial fibrillation: Secondary | ICD-10-CM

## 2019-12-20 DIAGNOSIS — K219 Gastro-esophageal reflux disease without esophagitis: Secondary | ICD-10-CM

## 2019-12-20 DIAGNOSIS — E7800 Pure hypercholesterolemia, unspecified: Secondary | ICD-10-CM

## 2019-12-20 DIAGNOSIS — I700 Atherosclerosis of aorta: Secondary | ICD-10-CM

## 2019-12-20 NOTE — Progress Notes (Signed)
MRN : [id number]  Bridget Briggs is a 86 y.o. (08-09-1930) female who presents with chief complaint of No chief complaint on file. Marland Kitchen  History of Present Illness:   Patient left after her ultrasound.  She was not seen by a provider  No outpatient medications have been marked as taking for the 12/20/19 encounter (Appointment) with Delana Meyer, Dolores Lory, MD.    Past Medical History:  Diagnosis Date  . Arthritis   . Depression   . GERD (gastroesophageal reflux disease)   . Glaucoma   . History of chicken pox   . History of colon polyps   . Hx: UTI (urinary tract infection)     Past Surgical History:  Procedure Laterality Date  . ABDOMINAL HYSTERECTOMY  1974   left ovary removed  . ANKLE SURGERY Left 1998  . APPENDECTOMY  1974  . BREAST BIOPSY Right 1988   NEG  . BREAST SURGERY Right 1988   biopsy  . CARDIAC CATHETERIZATION N/A 09/06/2015   Procedure: Right/Left Heart Cath and Coronary Angiography;  Surgeon: Yolonda Kida, MD;  Location: [facility];  Service: Cardiovascular;  Laterality: N/A;  . CHOLECYSTECTOMY  1996  . EYE SURGERY Bilateral 2005/2006   cataract  . LAPAROSCOPIC GASTRIC BANDING WITH HIATAL HERNIA REPAIR  2000  . NISSEN FUNDOPLICATION  7939  . OOPHORECTOMY Left   . TONSILLECTOMY AND ADENOIDECTOMY  1940    Social History Social History   Tobacco Use  . Smoking status: Former Smoker    Packs/day: 1.00    Years: 30.00    Pack years: 30.00    Types: Cigarettes    Quit date: 05/20/1988    Years since quitting: 31.6  . Smokeless tobacco: Never Used  Vaping Use  . Vaping Use: Never used  Substance Use Topics  . Alcohol use: No    Alcohol/week: 0.0 standard drinks  . Drug use: No    Family History Family History  Problem Relation Age of Onset  . Arthritis Mother   . Leukemia Mother   . Lung cancer Father   . Osteoporosis Other   . Breast cancer Neg Hx     Allergies  Allergen Reactions  . Penicillins Other (See Comments)    Has  patient had a PCN reaction causing immediate rash, facial/tongue/throat swelling, SOB or lightheadedness with hypotension: no  Has patient had a PCN reaction causing severe rash involving mucus membranes or skin necrosis: no Has patient had a PCN reaction that required hospitalization: no Has patient had a PCN reaction occurring within the last 10 years: yes  If all of the above answers are "NO", then may proceed with Cephalosporin use.       CBC Lab Results  Component Value Date   WBC 7.0 11/24/2019   HGB 13.9 11/24/2019   HCT 41.8 11/24/2019   MCV 86.6 11/24/2019   PLT 204.0 11/24/2019    BMET    Component Value Date/Time   NA 136 11/24/2019 1210   K 4.7 11/24/2019 1210   CL 102 11/24/2019 1210   CO2 28 11/24/2019 1210   GLUCOSE 105 (H) 11/24/2019 1210   BUN 16 11/24/2019 1210   CREATININE 0.80 11/24/2019 1210   CALCIUM 9.9 11/24/2019 1210   GFRNONAA >60 06/10/2015 0424   GFRAA >60 06/10/2015 0424   CrCl cannot be calculated (Patient's most recent lab result is older than the maximum 21 days allowed.).  COAG No results found for: INR, PROTIME  Radiology No results found.  Assessment/Plan 1. Stenosis of carotid artery, unspecified laterality Patient left after her ultrasound.  She was not seen by a provider   Hortencia Pilar, MD  12/20/2019 10:47 AM

## 2019-12-21 ENCOUNTER — Ambulatory Visit: Payer: Medicare Other

## 2019-12-22 ENCOUNTER — Ambulatory Visit (INDEPENDENT_AMBULATORY_CARE_PROVIDER_SITE_OTHER): Payer: Medicare Other

## 2019-12-22 ENCOUNTER — Other Ambulatory Visit: Payer: Self-pay

## 2019-12-22 DIAGNOSIS — E538 Deficiency of other specified B group vitamins: Secondary | ICD-10-CM | POA: Diagnosis not present

## 2019-12-22 MED ORDER — CYANOCOBALAMIN 1000 MCG/ML IJ SOLN
1000.0000 ug | Freq: Once | INTRAMUSCULAR | Status: AC
  Administered 2019-12-22: 1000 ug via INTRAMUSCULAR

## 2019-12-22 NOTE — Progress Notes (Addendum)
Patient presented for B 12 injection to left deltoid, patient voiced no concerns nor showed any signs of distress during injection.  Reviewed.  Dr Scott 

## 2019-12-23 ENCOUNTER — Ambulatory Visit: Payer: Medicare Other

## 2019-12-28 ENCOUNTER — Other Ambulatory Visit: Payer: Self-pay

## 2019-12-28 ENCOUNTER — Ambulatory Visit
Admission: RE | Admit: 2019-12-28 | Discharge: 2019-12-28 | Disposition: A | Discharge status: Home / Self Care | Payer: Medicare Other | Source: Ambulatory Visit | Attending: Neurology | Admitting: Neurology

## 2019-12-28 DIAGNOSIS — R4189 Other symptoms and signs involving cognitive functions and awareness: Secondary | ICD-10-CM | POA: Diagnosis not present

## 2019-12-28 DIAGNOSIS — G3184 Mild cognitive impairment, so stated: Secondary | ICD-10-CM | POA: Diagnosis not present

## 2020-01-01 DIAGNOSIS — F0390 Unspecified dementia without behavioral disturbance: Secondary | ICD-10-CM | POA: Diagnosis not present

## 2020-01-05 DIAGNOSIS — I208 Other forms of angina pectoris: Secondary | ICD-10-CM | POA: Diagnosis not present

## 2020-01-05 DIAGNOSIS — R0600 Dyspnea, unspecified: Secondary | ICD-10-CM | POA: Diagnosis not present

## 2020-01-11 ENCOUNTER — Encounter: Payer: Self-pay | Admitting: Internal Medicine

## 2020-01-11 ENCOUNTER — Telehealth (INDEPENDENT_AMBULATORY_CARE_PROVIDER_SITE_OTHER): Payer: Medicare Other | Admitting: Internal Medicine

## 2020-01-11 DIAGNOSIS — I700 Atherosclerosis of aorta: Secondary | ICD-10-CM

## 2020-01-11 DIAGNOSIS — I6529 Occlusion and stenosis of unspecified carotid artery: Secondary | ICD-10-CM | POA: Diagnosis not present

## 2020-01-11 DIAGNOSIS — H9190 Unspecified hearing loss, unspecified ear: Secondary | ICD-10-CM

## 2020-01-11 DIAGNOSIS — D649 Anemia, unspecified: Secondary | ICD-10-CM

## 2020-01-11 DIAGNOSIS — R2689 Other abnormalities of gait and mobility: Secondary | ICD-10-CM

## 2020-01-11 DIAGNOSIS — J479 Bronchiectasis, uncomplicated: Secondary | ICD-10-CM | POA: Diagnosis not present

## 2020-01-11 DIAGNOSIS — I480 Paroxysmal atrial fibrillation: Secondary | ICD-10-CM

## 2020-01-11 DIAGNOSIS — E7800 Pure hypercholesterolemia, unspecified: Secondary | ICD-10-CM | POA: Diagnosis not present

## 2020-01-11 DIAGNOSIS — H539 Unspecified visual disturbance: Secondary | ICD-10-CM | POA: Diagnosis not present

## 2020-01-11 NOTE — Progress Notes (Signed)
Patient ID: Bridget Briggs, female   DOB: [birth year], 86 y.o.   MRN: [id number]   Virtual Visit via video Note  This visit type was conducted due to national recommendations for restrictions regarding the COVID-19 pandemic (e.g. social distancing).  This format is felt to be most appropriate for this patient at this time.  All issues noted in this document were discussed and addressed.  No physical exam was performed (except for noted visual exam findings with Video Visits).   I connected with Bridget Briggs by a video enabled telemedicine application and verified that I am speaking with the correct person using two identifiers. Location patient: home Location provider: work Persons participating in the virtual visit: patient, provider and pts son Bridget Briggs).   The limitations, risks, security and privacy concerns of performing an evaluation and management service by video and the availability of in person appointments have been discussed.  It has also been discussed with the patient that there may be a patient responsible charge related to this service. The patient expressed understanding and agreed to proceed.   Reason for visit: scheduled follow up.   HPI: She is accompanied by her son.  History obtained from both of them.  Last visit with me, she reported some visual changes - gray or black paper that would go across her eyes.  Also described chest pain.  Since her last visit, she saw cardiology.  Recommended holter, echo and lexiscan myoview.  ECHO - norma LV function with mild MR, TR and PR.  EF 60%.  Lexiscan - normal myocardial perfusion scan with no evidence of stress induced myocardial ischemia.  Saw neurology as well - concern for lewy body dementia.  MRI - atrophy changes.  EEG - per report unrevealing.  Noticed to have decreased hearing.  Recommended hearing evaluation and also PT to help with balance and gait.  Son asked if we could get this arranged - has not heard yet regarding  scheduling.  Since her last visit, she has been doing relatively well.  Tries to stay physically and mentally active.  Plays cards.  Was recently notified that one of the ladies she plays cards with tested positive for covid.  Called 01/08/20 to notify her.  She reports she is doing fine. No fever, congestion, body aches, cough or sob reported.  Eating and drinking.  No vomiting or bowel change reported.     ROS: See pertinent positives and negatives per HPI.  Past Medical History:  Diagnosis Date  . Arthritis   . Depression   . GERD (gastroesophageal reflux disease)   . Glaucoma   . History of chicken pox   . History of colon polyps   . Hx: UTI (urinary tract infection)     Past Surgical History:  Procedure Laterality Date  . ABDOMINAL HYSTERECTOMY  1974   left ovary removed  . ANKLE SURGERY Left 1998  . APPENDECTOMY  1974  . BREAST BIOPSY Right 1988   NEG  . BREAST SURGERY Right 1988   biopsy  . CARDIAC CATHETERIZATION N/A 09/06/2015   Procedure: Right/Left Heart Cath and Coronary Angiography;  Surgeon: Yolonda Kida, MD;  Location: [place] [facility];  Service: Cardiovascular;  Laterality: N/A;  . CHOLECYSTECTOMY  1996  . EYE SURGERY Bilateral 2005/2006   cataract  . LAPAROSCOPIC GASTRIC BANDING WITH HIATAL HERNIA REPAIR  2000  . NISSEN FUNDOPLICATION  2836  . OOPHORECTOMY Left   . TONSILLECTOMY AND ADENOIDECTOMY  1940    Family History  Problem Relation Age of Onset  . Arthritis Mother   . Leukemia Mother   . Lung cancer Father   . Osteoporosis Other   . Breast cancer Neg Hx     SOCIAL HX: reviewed.    Current Outpatient Medications:  .  apixaban (ELIQUIS) 5 MG TABS tablet, Take 1 tablet (5 mg total) by mouth 2 (two) times daily., Disp: 60 tablet, Rfl: 0 .  APOAEQUORIN PO, Take by mouth., Disp: , Rfl:  .  beta carotene w/minerals (OCUVITE) tablet, Take 1 tablet by mouth daily., Disp: , Rfl:  .  Blood Pressure Monitor MISC, Use 1 each as directed, Disp: ,  Rfl:  .  calcium-vitamin D (OSCAL WITH D) 500-200 MG-UNIT per tablet, Take 1 tablet by mouth daily., Disp: , Rfl:  .  cholecalciferol (VITAMIN D3) 25 MCG (1000 UNIT) tablet, Take 1,000 Units by mouth daily. , Disp: , Rfl:  .  diphenhydramine-acetaminophen (TYLENOL PM) 25-500 MG TABS, Take 1 tablet by mouth at bedtime as needed., Disp: , Rfl:  .  dorzolamide-timolol (COSOPT) 22.3-6.8 MG/ML ophthalmic solution, Place 1 drop into both eyes 2 (two) times daily., Disp: , Rfl:  .  fluocinolone (SYNALAR) 0.01 % external solution, Apply topically 2 (two) times daily as needed., Disp: , Rfl:  .  latanoprost (XALATAN) 0.005 % ophthalmic solution, Place 1 drop into both eyes at bedtime., Disp: , Rfl:  .  metoprolol tartrate (LOPRESSOR) 25 MG tablet, Take 12.5 mg by mouth 2 (two) times daily. , Disp: , Rfl:  .  Multiple Vitamins-Minerals (CENTRUM SILVER PO), Take by mouth., Disp: , Rfl:  .  rosuvastatin (CRESTOR) 5 MG tablet, TAKE 1 TABLET BY MOUTH EVERY MONDAY, WEDNESDAY AND FRIDAY, Disp: 40 tablet, Rfl: 1  EXAM:  GENERAL: alert, oriented, appears well and in no acute distress  HEENT: atraumatic, conjunttiva clear, no obvious abnormalities on inspection of external nose and ears  NECK: normal movements of the head and neck  LUNGS: on inspection no signs of respiratory distress, breathing rate appears normal, no obvious gross SOB, gasping or wheezing  CV: no obvious cyanosis  PSYCH/NEURO: pleasant and cooperative, no obvious depression or anxiety, speech and thought processing grossly intact  ASSESSMENT AND PLAN:  Discussed the following assessment and plan:  Hypercholesterolemia On crestor.  Low cholesterol diet and exercise.  Follow lipid panel and liver function tests.    Change in vision Visual change as outlined in last note. Saw neurology.  Note reviewed.  Concern regarding visual hallucinations.  Concern regarding lewy body dementia.  Continue with mental exercise as she is doing.  MRI -  atrophy. Continue f/u with neurology.    Carotid stenosis Saw Dr Delana Meyer. Carotid ultrasound with no significant stenosis.   Bronchiectasis without acute exacerbation Breathing stable.  Has been followed by pulmonary previously.  Would like to obtain cxr.  Will need to arrange after out of quarantine.    Atrial fibrillation ([place]) Followed by cardiology. On eliquis.  Stable.    Aortic atherosclerosis ([facility]) Crestor.   Anemia Follow cbc.   Change in hearing Refer to ENT for hearing evaluation.   Balance problem Per neurology, recommended physical therapy for balance therapy.    Orders Placed This Encounter  Procedures  . Ambulatory referral to ENT    Referral Priority:   Routine    Referral Type:   Consultation    Referral Reason:   Specialty Services Required    Requested Specialty:   Otolaryngology    Number of Visits Requested:  1  . Ambulatory referral to Physical Therapy    Referral Priority:   Routine    Referral Type:   Physical Medicine    Referral Reason:   Specialty Services Required    Requested Specialty:   Physical Therapy    Number of Visits Requested:   1     I discussed the assessment and treatment plan with the patient. The patient was provided an opportunity to ask questions and all were answered. The patient agreed with the plan and demonstrated an understanding of the instructions.   The patient was advised to call back or seek an in-person evaluation if the symptoms worsen or if the condition fails to improve as anticipated.   Einar Pheasant, MD

## 2020-01-12 ENCOUNTER — Other Ambulatory Visit: Payer: Medicare Other

## 2020-01-12 ENCOUNTER — Other Ambulatory Visit: Payer: Self-pay

## 2020-01-12 DIAGNOSIS — Z20822 Contact with and (suspected) exposure to covid-19: Secondary | ICD-10-CM

## 2020-01-13 ENCOUNTER — Encounter: Payer: Self-pay | Admitting: Internal Medicine

## 2020-01-14 LAB — SARS-COV-2, NAA 2 DAY TAT

## 2020-01-14 LAB — NOVEL CORONAVIRUS, NAA: SARS-CoV-2, NAA: NOT DETECTED

## 2020-01-17 DIAGNOSIS — H9190 Unspecified hearing loss, unspecified ear: Secondary | ICD-10-CM | POA: Insufficient documentation

## 2020-01-17 DIAGNOSIS — R2689 Other abnormalities of gait and mobility: Secondary | ICD-10-CM | POA: Insufficient documentation

## 2020-01-17 NOTE — Assessment & Plan Note (Signed)
Followed by cardiology.  On eliquis.  Stable.   

## 2020-01-17 NOTE — Assessment & Plan Note (Signed)
Follow cbc.  

## 2020-01-17 NOTE — Assessment & Plan Note (Signed)
Refer to ENT for hearing evaluation.

## 2020-01-17 NOTE — Assessment & Plan Note (Addendum)
Saw Dr Delana Meyer. Carotid ultrasound with no significant stenosis.

## 2020-01-17 NOTE — Assessment & Plan Note (Signed)
-   Crestor 

## 2020-01-17 NOTE — Assessment & Plan Note (Signed)
Breathing stable.  Has been followed by pulmonary previously.  Would like to obtain cxr.  Will need to arrange after out of quarantine.

## 2020-01-17 NOTE — Assessment & Plan Note (Signed)
On crestor.  Low cholesterol diet and exercise.  Follow lipid panel and liver function tests.   

## 2020-01-17 NOTE — Assessment & Plan Note (Signed)
Visual change as outlined in last note. Saw neurology.  Note reviewed.  Concern regarding visual hallucinations.  Concern regarding lewy body dementia.  Continue with mental exercise as she is doing.  MRI - atrophy. Continue f/u with neurology.

## 2020-01-17 NOTE — Assessment & Plan Note (Signed)
Per neurology, recommended physical therapy for balance therapy.

## 2020-01-27 ENCOUNTER — Encounter (INDEPENDENT_AMBULATORY_CARE_PROVIDER_SITE_OTHER): Payer: Self-pay | Admitting: Vascular Surgery

## 2020-01-27 ENCOUNTER — Ambulatory Visit (INDEPENDENT_AMBULATORY_CARE_PROVIDER_SITE_OTHER): Payer: Medicare Other

## 2020-01-27 ENCOUNTER — Encounter: Payer: Self-pay | Admitting: Internal Medicine

## 2020-01-27 ENCOUNTER — Other Ambulatory Visit: Payer: Self-pay

## 2020-01-27 DIAGNOSIS — R001 Bradycardia, unspecified: Secondary | ICD-10-CM | POA: Diagnosis not present

## 2020-01-27 DIAGNOSIS — E538 Deficiency of other specified B group vitamins: Secondary | ICD-10-CM | POA: Diagnosis not present

## 2020-01-27 DIAGNOSIS — R0600 Dyspnea, unspecified: Secondary | ICD-10-CM | POA: Diagnosis not present

## 2020-01-27 DIAGNOSIS — I480 Paroxysmal atrial fibrillation: Secondary | ICD-10-CM | POA: Diagnosis not present

## 2020-01-27 DIAGNOSIS — I208 Other forms of angina pectoris: Secondary | ICD-10-CM | POA: Diagnosis not present

## 2020-01-27 MED ORDER — CYANOCOBALAMIN 1000 MCG/ML IJ SOLN
1000.0000 ug | Freq: Once | INTRAMUSCULAR | Status: AC
  Administered 2020-01-27: 1000 ug via INTRAMUSCULAR

## 2020-01-27 NOTE — Progress Notes (Signed)
Patient presented for B 12 injection to left deltoid, patient voiced no concerns nor showed any signs of distress during injection. 

## 2020-01-27 NOTE — Telephone Encounter (Signed)
The order for the referrals have been placed and say auth - please check with [facility] regarding scheduling.  I do agree with getting the covid vaccine - moderna or phizer - I am ok.

## 2020-01-28 ENCOUNTER — Telehealth: Payer: Self-pay | Admitting: Internal Medicine

## 2020-01-28 NOTE — Telephone Encounter (Signed)
lft msg on vm [facility] following up on pt referral.

## 2020-01-28 NOTE — Telephone Encounter (Signed)
Thank you for the f/u.  Has the pt been notified.  Please notify if not.  Thank you.

## 2020-01-28 NOTE — Telephone Encounter (Signed)
Pt no showed on 09/01 at [facility] ENT and is now resch on 02/09/2020 at 1:30pm.  [facility] physical therapy I called and left a vm for the ofc to call to follow up.

## 2020-01-29 NOTE — Telephone Encounter (Signed)
Reviewed your message and thank you for your help.  Your note states office notified of appt.  I assume this is the ENT appt.  Can we f/u with PT and make sure they follow through with appt as well.  Thanks again for your help.  I appreciate it.

## 2020-01-31 NOTE — Telephone Encounter (Signed)
I just wanted to make sure that physical therapy was going to contact her with appt as well.  Thanks

## 2020-02-09 DIAGNOSIS — H6121 Impacted cerumen, right ear: Secondary | ICD-10-CM | POA: Diagnosis not present

## 2020-02-09 DIAGNOSIS — H903 Sensorineural hearing loss, bilateral: Secondary | ICD-10-CM | POA: Diagnosis not present

## 2020-02-21 ENCOUNTER — Other Ambulatory Visit: Payer: Self-pay | Admitting: Internal Medicine

## 2020-02-22 ENCOUNTER — Ambulatory Visit: Payer: Medicare Other | Attending: Neurology

## 2020-02-22 ENCOUNTER — Other Ambulatory Visit: Payer: Self-pay

## 2020-02-22 DIAGNOSIS — R2681 Unsteadiness on feet: Secondary | ICD-10-CM | POA: Diagnosis not present

## 2020-02-22 DIAGNOSIS — R2689 Other abnormalities of gait and mobility: Secondary | ICD-10-CM

## 2020-02-22 NOTE — Therapy (Addendum)
[place] [facility] SERVICES [address] [place], [place], [address] Phone: [phone]   Fax:  [phone]  Physical Therapy Evaluation  Patient Details  Name: Bridget Briggs MRN: [id number] Date of Birth: [birth year] Referring Provider (PT): Jennings Books (neurology)    Encounter Date: 02/22/2020   PT End of Session - 02/22/20 1456    Visit Number 1    Number of Visits 17    Date for PT Re-Evaluation 05/16/20    Authorization Type BCBS Medicare    Authorization Time Period 02/22/20-05/16/20    PT Start Time 1302    PT Stop Time 1358    PT Time Calculation (min) 56 min    Activity Tolerance Patient tolerated treatment well;Patient limited by fatigue;No increased pain    Behavior During Therapy WFL for tasks assessed/performed           Past Medical History:  Diagnosis Date  . Arthritis   . Depression   . GERD (gastroesophageal reflux disease)   . Glaucoma   . History of chicken pox   . History of colon polyps   . Hx: UTI (urinary tract infection)     Past Surgical History:  Procedure Laterality Date  . ABDOMINAL HYSTERECTOMY  1974   left ovary removed  . ANKLE SURGERY Left 1998  . APPENDECTOMY  1974  . BREAST BIOPSY Right 1988   NEG  . BREAST SURGERY Right 1988   biopsy  . CARDIAC CATHETERIZATION N/A 09/06/2015   Procedure: Right/Left Heart Cath and Coronary Angiography;  Surgeon: Yolonda Kida, MD;  Location: [place] [facility];  Service: Cardiovascular;  Laterality: N/A;  . CHOLECYSTECTOMY  1996  . EYE SURGERY Bilateral 2005/2006   cataract  . LAPAROSCOPIC GASTRIC BANDING WITH HIATAL HERNIA REPAIR  2000  . NISSEN FUNDOPLICATION  8250  . OOPHORECTOMY Left   . TONSILLECTOMY AND ADENOIDECTOMY  1940    There were no vitals filed for this visit.    Subjective Assessment - 02/22/20 1308    Subjective Pt presents to OPPT for evaluation of unsteadiness on feet. Pt referred by Neurologist who sees her for Lewy Body  Dementian diagnosis, new as of a few months ago. Son present during evaluation to provide history, reports 1 close-call wherein he provided physical assistance to  prevent fall. Son reports pt AMB with a shufle and appears to catch her toes on sudden chances in surface height.    Patient is accompained by: Family member    How long can you sit comfortably? N/A    How long can you stand comfortably? N/A    How long can you walk comfortably? ~ 1/2 mile    Diagnostic tests being followed for new dx of lewy body dementia    Currently in Pain? No/denies              [facility] PT Assessment - 02/22/20 0001      Assessment   Medical Diagnosis Unsteadiness of gait, shuffle    Referring Provider (PT) Hemang Manuella Ghazi (neurology)     Onset Date/Surgical Date --   Progressive, more concerning ver past few months   Hand Dominance Right    Prior Therapy For Left hip pain in 2016      Precautions   Precautions None      Restrictions   Weight Bearing Restrictions No      Balance Screen   Has the patient fallen in the past 6 months No    Has  the patient had a decrease in activity level because of a fear of falling?  No    Is the patient reluctant to leave their home because of a fear of falling?  No      Home Environment   Living Environment Private residence    Living Arrangements Alone    Type of [place] Access Level entry   step over threshold   Home Equipment Grab bars - toilet;Grab bars - tub/shower      Prior Function   Level of Independence Independent;Independent with basic ADLs;Independent with community mobility without device    Vocation Retired    Leisure Stopped going to [employer] when [employer] started    [employer] at [employer], 7days week, walks 1 lap inside     Cognition   Overall Cognitive Status Within Functional Limits for tasks assessed    Attention Focused      Observation/Other Assessments   Focus on Therapeutic Outcomes (FOTO)  65/100       Ambulation/Gait   Ambulation Distance (Feet) 400 Feet    Assistive device None    Gait Pattern Step-through pattern;Decreased dorsiflexion - left;Left foot flat    Gait velocity 0.65m/s      Balance   Balance Assessed Yes      Standardized Balance Assessment   Standardized Balance Assessment Berg Balance Test;Dynamic Gait Index      Berg Balance Test   Sit to Stand Able to stand without using hands and stabilize independently    Standing Unsupported Able to stand safely 2 minutes    Sitting with Back Unsupported but Feet Supported on Floor or Stool Able to sit safely and securely 2 minutes    Stand to Sit Sits safely with minimal use of hands    Transfers Able to transfer safely, minor use of hands    Standing Unsupported with Eyes Closed Able to stand 10 seconds safely    Standing Unsupported with Feet Together Able to place feet together independently and stand 1 minute safely    From Standing, Reach Forward with Outstretched Arm Can reach confidently >25 cm (10")    From Standing Position, Pick up Object from Floor Able to pick up shoe safely and easily    From Standing Position, Turn to Look Behind Over each Shoulder Looks behind from both sides and weight shifts well    Turn 360 Degrees Able to turn 360 degrees safely in 4 seconds or less    Standing Unsupported, Alternately Place Feet on Step/Stool Able to complete >2 steps/needs minimal assist    Standing Unsupported, One Foot in Front Able to take small step independently and hold 30 seconds    Standing on One Leg Tries to lift leg/unable to hold 3 seconds but remains standing independently    Total Score 48      Dynamic Gait Index   Level Surface Normal    Change in Gait Speed Normal    Gait with Horizontal Head Turns Mild Impairment    Gait with Vertical Head Turns Mild Impairment    Gait and Pivot Turn Mild Impairment    Step Over Obstacle Moderate Impairment    Step Around Obstacles Normal    Steps Mild Impairment    step-over pattern up, step-to pattern down   Total Score 18           10MWT: no device used; 0.86m/s      Objective measurements completed on examination: See  above findings.      Patient currently walking community distances daily for exercise, but clinically only AMB at speeds appropriate for slower community distances. Goal is to increase gait speed to current gait distance tolerances     PT Education - 02/22/20 1455    Education provided Yes    Education Details results of balance screening tools and plan moving forward    Person(s) Educated Patient    Methods Explanation;Demonstration;Handout    Comprehension Verbalized understanding            PT Short Term Goals - 02/22/20 1515      PT SHORT TERM GOAL #1   Title After 4 weeks pt to demonstrate 10MWT >0.11m/s    Baseline 0.76m/s at eval;    Time 4    Period Weeks    Status New    Target Date 03/21/20      PT SHORT TERM GOAL #2   Title After 4 weeks patient to demonstrate improved SLS balance to >5 sec bilat and supervision level assistance.    Baseline <3 seconds at minGuard assist at eval    Time 4    Period Weeks    Status New    Target Date 03/21/20             PT Long Term Goals - 02/22/20 1518      PT LONG TERM GOAL #1   Title After 8 weeks pt to demonstrate FOTO score >71 to show improved functional independence.    Baseline 64 at eval    Time 8    Period Weeks    Status New    Target Date 04/18/20      PT LONG TERM GOAL #2   Title After 8 weeks pt to demonstrate improved DGI to >21 to show decreased falls risk.    Baseline At eval: 18/24.    Time 8    Period Weeks    Status New    Target Date 04/18/20      PT LONG TERM GOAL #3   Title After 12 weeks pt to demonstrate improved 6MWT to 1221ft (age-matched norm value for females 80-89yo)    Time 12    Period Weeks    Status New    Target Date 05/16/20      PT LONG TERM GOAL #4   Title After 12 weeks pt to demonstrate 10MWT>1.73m/s  to show reduced falls risk from a gait standpoint.    Baseline 0.47m/s @ eval    Time 12    Period Weeks    Status New    Target Date 05/16/20                  Plan - 02/22/20 1458    Clinical Impression Statement Cailah Reach is an 79yoF who presents to OPPT for evaluation of unsteady gait/shuffle, referred by neurologist who has been following her for less than 6 months for new diagnosis of Lewy Body Dementia. Son reports one near-fall event wherein she caught her toes (poor clearance) at an uneven sidewalk and Son had to provide heavy physical assist to prevent fall to ground. Son reports progression of a mild shuffle gait over the last 1-2 years that is more obvious in th epast 6 months. Examination reveals BBT: 48/56, DGI: 18/24, 10MWT: 0.81m/s. Gait is consistent over several minutes without any early signs of fatigue related degradation, however pt has slight flexion at the trunk, postural kyphosis, reduced trunk movement in gait, and  increased Left foot scuff/flat foot strike. Step in gait are equal in time. Pt has some mild increased DOE and mild to moderate increased RPE with limited community distance AMB in session, consistent with baseline performance per son. Testing demonstrates more difficulty wth balance while AMB, and gait speed correlates will increased risk of sustaining a fall, despite being a community ambulator at baseline. Exam also warrants further investigation of focal Left ankle mobility and strength to further illiterate contributing factors to gait abnormality. Globally, the patient has mild decreased balance, but moreover moderately reduced capatiy for high velocity/power righting strategies. Pt will benefit from skilled PT invertion to adress deficits and impairment of strength, balance, motor control, power output, in order to reduce falls risk, improve independence in IADL, and improve tolerance to community distance mobility.    Personal Factors and  Comorbidities Age;Behavior Pattern    Examination-Activity Limitations Squat;Stand;Stairs;Sit;Transfers    Examination-Participation Restrictions Cleaning;Yard Work;Community Activity    Clinical Decision Making Low    Rehab Potential Good    PT Frequency 2x / week    PT Duration 12 weeks    PT Treatment/Interventions Therapeutic exercise;Manual techniques;Moist Heat;Ultrasound;Electrical Stimulation;Gait training;Stair training;Functional mobility training;Therapeutic activities;Balance training;Neuromuscular re-education;Patient/family education;Passive range of motion;Energy conservation;Orthotic Fit/Training;Joint Manipulations    PT Next Visit Plan ROM and Strength of bilat ankles, begin to establish HEP; consider TUG or 6MWT    PT Home Exercise Plan None set up at visit 2    Consulted and Agree with Plan of Care Patient           Patient will benefit from skilled therapeutic intervention in order to improve the following deficits and impairments:  Abnormal gait, Decreased balance, Decreased range of motion, Improper body mechanics, Decreased activity tolerance, Decreased knowledge of use of DME, Decreased strength, Postural dysfunction  Visit Diagnosis: Unsteadiness on feet - Plan: PT plan of care cert/re-cert  Other abnormalities of gait and mobility - Plan: PT plan of care cert/re-cert     Problem List Patient Active Problem List   Diagnosis Date Noted  . Change in hearing 01/17/2020  . Balance problem 01/17/2020  . Change in vision 11/26/2019  . Hypercholesterolemia 07/01/2019  . Elevated TSH 07/02/2018  . Right hip pain 07/02/2018  . Sweating abnormality 12/28/2017  . Aortic atherosclerosis ([place]) 10/29/2016  . B12 deficiency 10/29/2015  . Anemia 08/28/2015  . Cough 06/28/2015  . Atrial fibrillation ([place]) 06/08/2015  . Left hip pain 04/05/2015  . Osteoporosis, post-menopausal 10/10/2014  . Pain of left thumb 08/06/2014  . Health care maintenance 08/06/2014  .  Osteoporosis 07/28/2014  . Bronchiectasis without acute exacerbation ([place]) 01/20/2014  . Multiple pulmonary nodules 01/20/2014  . Abnormal CXR 11/02/2013  . Chest pain 11/01/2013  . Fatigue 11/01/2013  . Burning sensation in lower extremity 11/01/2013  . Environmental allergies 04/19/2013  . Arthritis 01/18/2013  . Depression 01/18/2013  . GERD (gastroesophageal reflux disease) 01/18/2013  . Glaucoma 01/18/2013  . History of colonic polyps 01/18/2013  . Carotid stenosis 01/18/2013   3:31 PM, 02/22/20 Etta Grandchild, PT, DPT Physical Therapist - [facility]  [phone] ([address]    [place] 02/22/2020, 3:29 PM  [place] MAIN [facility] SERVICES [address] [place], [place], [address] Phone: [phone]   Fax:  [phone]  Name: Bridget Briggs MRN: [id number] Date of Birth: 01/24/1931

## 2020-02-28 DIAGNOSIS — H524 Presbyopia: Secondary | ICD-10-CM | POA: Diagnosis not present

## 2020-02-28 DIAGNOSIS — H40153 Residual stage of open-angle glaucoma, bilateral: Secondary | ICD-10-CM | POA: Diagnosis not present

## 2020-02-29 ENCOUNTER — Ambulatory Visit: Payer: Medicare Other

## 2020-02-29 ENCOUNTER — Ambulatory Visit (INDEPENDENT_AMBULATORY_CARE_PROVIDER_SITE_OTHER): Payer: Medicare Other

## 2020-02-29 DIAGNOSIS — Z23 Encounter for immunization: Secondary | ICD-10-CM | POA: Diagnosis not present

## 2020-02-29 DIAGNOSIS — E538 Deficiency of other specified B group vitamins: Secondary | ICD-10-CM | POA: Diagnosis not present

## 2020-02-29 MED ORDER — CYANOCOBALAMIN 1000 MCG/ML IJ SOLN
1000.0000 ug | Freq: Once | INTRAMUSCULAR | Status: AC
  Administered 2020-02-29: 1000 ug via INTRAMUSCULAR

## 2020-02-29 NOTE — Progress Notes (Addendum)
Patient presented for B 12 injection to right deltoid, patient voiced no concerns nor showed any signs of distress during injection.  Reviewed.  Dr Scott 

## 2020-03-01 ENCOUNTER — Ambulatory Visit: Payer: Medicare Other

## 2020-03-01 NOTE — Patient Instructions (Addendum)
Access Code: [email or web address] URL: [email or web address] Date: 03/02/2020 Prepared by: Roxana Hires  Exercises Seated Hip Flexion March with Ankle Weights - 1 x daily - 7 x weekly - 2 sets - 10 reps - 3s hold Sit to Stand without Arm Support - 1 x daily - 7 x weekly - 2 sets - 10 reps Seated Long Arc Quad with Ankle Weight - 1 x daily - 7 x weekly - 2 sets - 10 reps - 3s hold Seated Heel Toe Raises - 1 x daily - 7 x weekly - 2 sets - 10 reps - 3s hold

## 2020-03-02 ENCOUNTER — Other Ambulatory Visit: Payer: Self-pay

## 2020-03-02 ENCOUNTER — Ambulatory Visit: Payer: Medicare Other

## 2020-03-02 VITALS — BP 127/62 | HR 84

## 2020-03-02 DIAGNOSIS — R2689 Other abnormalities of gait and mobility: Secondary | ICD-10-CM

## 2020-03-02 DIAGNOSIS — R2681 Unsteadiness on feet: Secondary | ICD-10-CM

## 2020-03-02 NOTE — Therapy (Addendum)
[place] MAIN [facility] SERVICES [address] [place], [place], [address] Phone: [phone]   Fax:  [phone]  Physical Therapy Treatment  Patient Details  Name: Bridget Briggs MRN: [id number] Date of Birth: October 19, 1930 Referring Provider (PT): Jennings Books (neurology)    Encounter Date: 03/02/2020   PT End of Session - 03/02/20 1425    Visit Number 2    Number of Visits 17    Date for PT Re-Evaluation 05/16/20    Authorization Type BCBS Medicare    Authorization Time Period 02/22/20-05/16/20    PT Start Time 1430    PT Stop Time 1515    PT Time Calculation (min) 45 min    Activity Tolerance Patient tolerated treatment well;Patient limited by fatigue;No increased pain    Behavior During Therapy WFL for tasks assessed/performed           Past Medical History:  Diagnosis Date  . Arthritis   . Depression   . GERD (gastroesophageal reflux disease)   . Glaucoma   . History of chicken pox   . History of colon polyps   . Hx: UTI (urinary tract infection)     Past Surgical History:  Procedure Laterality Date  . ABDOMINAL HYSTERECTOMY  1974   left ovary removed  . ANKLE SURGERY Left 1998  . APPENDECTOMY  1974  . BREAST BIOPSY Right 1988   NEG  . BREAST SURGERY Right 1988   biopsy  . CARDIAC CATHETERIZATION N/A 09/06/2015   Procedure: Right/Left Heart Cath and Coronary Angiography;  Surgeon: Yolonda Kida, MD;  Location: [place] [facility];  Service: Cardiovascular;  Laterality: N/A;  . CHOLECYSTECTOMY  1996  . EYE SURGERY Bilateral 2005/2006   cataract  . LAPAROSCOPIC GASTRIC BANDING WITH HIATAL HERNIA REPAIR  2000  . NISSEN FUNDOPLICATION  7989  . OOPHORECTOMY Left   . TONSILLECTOMY AND ADENOIDECTOMY  1940    Vitals:   03/02/20 1432  BP: 127/62  Pulse: 84  SpO2: 98%     Subjective Assessment - 03/02/20 1424    Subjective Patient reports she is doing well today.  No specific questions or concerns upon arrival.   Denies any pain at this time.   Patient is accompained by: Family member    Pertinent History Pt presents to OP PT for evaluation of unsteadiness on feet. Pt referred by Neurologist who sees her for Lewy Body Dementian diagnosis, new as of a few months ago. Son present during evaluation to provide history, reports 1 close-call wherein he provided physical assistance to  prevent fall. Son reports pt AMB with a shufle and appears to catch her toes on sudden chances in surface height.    How long can you sit comfortably? N/A    How long can you stand comfortably? N/A    How long can you walk comfortably? ~ 1/2 mile    Diagnostic tests being followed for new dx of lewy body dementia    Currently in Pain? No/denies              [facility] PT Assessment - 03/02/20 1453      6 Minute Walk- Baseline   6 Minute Walk- Baseline yes    BP (mmHg) 127/62    HR (bpm) 84    02 Sat (%RA) 98 %    Modified Borg Scale for Dyspnea 0- Nothing at all      6 Minute walk- Post Test   6 Minute Walk Post Test yes  BP (mmHg) 124/96    HR (bpm) 90    02 Sat (%RA) 98 %    Modified Borg Scale for Dyspnea 8-      6 minute walk test results    Aerobic Endurance Distance Walked 975    Endurance additional comments no assistive device, cues and reminder for where to turn             TREATMENT   Ther-ex  TUG: 11.7s Cognitive TUG: 17.6s 6MWT: 22' (normative data for female community dwelling elderly 95-89 yo =1286') Sit to stand without UE support from regular height chair x 10;  Standing exercises with 3# ankle weights: Hip flexion marching x 15 BLE; Hip abduction x 15 BLE; Hip extension x 15 BLE; HS curls x 15 BLE;  Standing heel raises with BUE support x 15;    Neuromuscular Re-education  All balance exercises performed without UE support and CGA provided by therapist throughout unless otherwise noted: Airex WBOS eyes open/closed x 30s each; Airex alternating 6" step taps x 10 BLE; NBOS  eyes open/closed x 30s each; HEP issued and education provided to patient and son;   Pt educated throughout session about proper posture and technique with exercises. Improved exercise technique, movement at target joints, use of target muscles after min to mod verbal, visual, tactile cues.    Performed additional outcome measures with patient during session today.  She completed the TUG in 11.7 seconds which is within normal limits however cognitive TUG was 17.6 seconds demonstrating more than 10% decline when cognitive task added.  Patient would benefit from interventions that involve dual task to challenge balance with cognitive sequencing.  Her 6-minute walk test of 975 feet was below the normative value of 1286 feet for female community dwelling elderly patients between the ages of 99 to 55 years old.  Initiated strength and balance exercises with patient today and HEP issued.  Education provided to patient and son regarding how to complete HEP at home.  Patient encouraged to follow-up as scheduled. She will benefit from PT services to address deficits in strength, balance, and mobility in order to return to full function at home.                   PT Education - 03/02/20 1533    Education provided Yes    Education Details HEP    Person(s) Educated Patient;Child(ren)    Methods Explanation;Handout    Comprehension Verbalized understanding            PT Short Term Goals - 02/22/20 1515      PT SHORT TERM GOAL #1   Title After 4 weeks pt to demonstrate 10MWT >0.6m/s    Baseline 0.35m/s at eval;    Time 4    Period Weeks    Status New    Target Date 03/21/20      PT SHORT TERM GOAL #2   Title After 4 weeks patient to demonstrate improved SLS balance to >5 sec bilat and supervision level assistance.    Baseline <3 seconds at minGuard assist at eval    Time 4    Period Weeks    Status New    Target Date 03/21/20             PT Long Term Goals - 02/22/20 1518       PT LONG TERM GOAL #1   Title After 8 weeks pt to demonstrate FOTO score >71 to show improved functional independence.  Baseline 64 at eval    Time 8    Period Weeks    Status New    Target Date 04/18/20      PT LONG TERM GOAL #2   Title After 8 weeks pt to demonstrate improved DGI to >21 to show decreased falls risk.    Baseline At eval: 18/24.    Time 8    Period Weeks    Status New    Target Date 04/18/20      PT LONG TERM GOAL #3   Title After 12 weeks pt to demonstrate improved 6MWT to 1232ft (age-matched norm value for females 80-89yo)    Time 12    Period Weeks    Status New    Target Date 05/16/20      PT LONG TERM GOAL #4   Title After 12 weeks pt to demonstrate 10MWT>1.49m/s to show reduced falls risk from a gait standpoint.    Baseline 0.4m/s @ eval    Time 12    Period Weeks    Status New    Target Date 05/16/20                 Plan - 03/02/20 1425    Clinical Impression Statement Performed additional outcome measures with patient during session today.  She completed the TUG in 11.7 seconds which is within normal limits however cognitive TUG was 17.6 seconds demonstrating more than 10% decline when cognitive task added.  Patient would benefit from interventions that involve dual task to challenge balance with cognitive sequencing.  Her 6-minute walk test of 975 feet was below the normative value of 1286 feet for female community dwelling elderly patients between the ages of 47 to 98 years old.  Initiated strength and balance exercises with patient today and HEP issued.  Education provided to patient and son regarding how to complete HEP at home.  Patient encouraged to follow-up as scheduled. She will benefit from PT services to address deficits in strength, balance, and mobility in order to return to full function at home.    Personal Factors and Comorbidities Age;Behavior Pattern    Examination-Activity Limitations Squat;Stand;Stairs;Sit;Transfers     Examination-Participation Restrictions Cleaning;Yard Work;Community Activity    Rehab Potential Good    PT Frequency 2x / week    PT Duration 12 weeks    PT Treatment/Interventions Therapeutic exercise;Manual techniques;Moist Heat;Ultrasound;Electrical Stimulation;Gait training;Stair training;Functional mobility training;Therapeutic activities;Balance training;Neuromuscular re-education;Patient/family education;Passive range of motion;Energy conservation;Orthotic Fit/Training;Joint Manipulations    PT Next Visit Plan Review HEP with patient, progress balance and strength, consider adding additional ankle strengthening    PT Home Exercise Plan Access Code: [email or web address]    Consulted and Agree with Plan of Care Patient           Patient will benefit from skilled therapeutic intervention in order to improve the following deficits and impairments:  Abnormal gait, Decreased balance, Decreased range of motion, Improper body mechanics, Decreased activity tolerance, Decreased knowledge of use of DME, Decreased strength, Postural dysfunction  Visit Diagnosis: Unsteadiness on feet  Other abnormalities of gait and mobility     Problem List Patient Active Problem List   Diagnosis Date Noted  . Change in hearing 01/17/2020  . Balance problem 01/17/2020  . Change in vision 11/26/2019  . Hypercholesterolemia 07/01/2019  . Elevated TSH 07/02/2018  . Right hip pain 07/02/2018  . Sweating abnormality 12/28/2017  . Aortic atherosclerosis ([place]) 10/29/2016  . B12 deficiency 10/29/2015  . Anemia 08/28/2015  . Cough 06/28/2015  .  Atrial fibrillation ([place]) 06/08/2015  . Left hip pain 04/05/2015  . Osteoporosis, post-menopausal 10/10/2014  . Pain of left thumb 08/06/2014  . Health care maintenance 08/06/2014  . Osteoporosis 07/28/2014  . Bronchiectasis without acute exacerbation ([employer]) 01/20/2014  . Multiple pulmonary nodules 01/20/2014  . Abnormal CXR 11/02/2013  . Chest pain 11/01/2013  . Fatigue  11/01/2013  . Burning sensation in lower extremity 11/01/2013  . Environmental allergies 04/19/2013  . Arthritis 01/18/2013  . Depression 01/18/2013  . GERD (gastroesophageal reflux disease) 01/18/2013  . Glaucoma 01/18/2013  . History of colonic polyps 01/18/2013  . Carotid stenosis 01/18/2013   Phillips Grout PT, DPT, GCS  Bridget Briggs 03/02/2020, 3:37 PM  [place] MAIN [facility] SERVICES [address] [place], [place], [address] Phone: [phone]   Fax:  [phone]  Name: Bridget Briggs MRN: [id number] Date of Birth: [birth year]

## 2020-03-06 DIAGNOSIS — R2689 Other abnormalities of gait and mobility: Secondary | ICD-10-CM | POA: Diagnosis not present

## 2020-03-06 DIAGNOSIS — G3183 Dementia with Lewy bodies: Secondary | ICD-10-CM | POA: Diagnosis not present

## 2020-03-06 DIAGNOSIS — H9193 Unspecified hearing loss, bilateral: Secondary | ICD-10-CM | POA: Diagnosis not present

## 2020-03-06 DIAGNOSIS — I480 Paroxysmal atrial fibrillation: Secondary | ICD-10-CM | POA: Diagnosis not present

## 2020-03-07 ENCOUNTER — Other Ambulatory Visit: Payer: Self-pay

## 2020-03-07 ENCOUNTER — Ambulatory Visit: Payer: Medicare Other

## 2020-03-07 DIAGNOSIS — R2689 Other abnormalities of gait and mobility: Secondary | ICD-10-CM | POA: Diagnosis not present

## 2020-03-07 DIAGNOSIS — R2681 Unsteadiness on feet: Secondary | ICD-10-CM | POA: Diagnosis not present

## 2020-03-07 NOTE — Therapy (Signed)
[place] MAIN [facility] SERVICES [address] [place], [place], [address] Phone: [phone]   Fax:  [phone]  Physical Therapy Treatment  Patient Details  Name: Bridget Briggs MRN: [id number] Date of Birth: [birth year] Referring Provider (PT): Jennings Books (neurology)    Encounter Date: 03/07/2020   PT End of Session - 03/07/20 1259    Visit Number 3    Number of Visits 17    Date for PT Re-Evaluation 05/16/20    Authorization Type BCBS Medicare    Authorization Time Period 02/22/20-05/16/20    PT Start Time 1300    PT Stop Time 1345    PT Time Calculation (min) 45 min    Activity Tolerance Patient tolerated treatment well;Patient limited by fatigue;No increased pain    Behavior During Therapy WFL for tasks assessed/performed           Past Medical History:  Diagnosis Date  . Arthritis   . Depression   . GERD (gastroesophageal reflux disease)   . Glaucoma   . History of chicken pox   . History of colon polyps   . Hx: UTI (urinary tract infection)     Past Surgical History:  Procedure Laterality Date  . ABDOMINAL HYSTERECTOMY  1974   left ovary removed  . ANKLE SURGERY Left 1998  . APPENDECTOMY  1974  . BREAST BIOPSY Right 1988   NEG  . BREAST SURGERY Right 1988   biopsy  . CARDIAC CATHETERIZATION N/A 09/06/2015   Procedure: Right/Left Heart Cath and Coronary Angiography;  Surgeon: Yolonda Kida, MD;  Location: [place] [facility];  Service: Cardiovascular;  Laterality: N/A;  . CHOLECYSTECTOMY  1996  . EYE SURGERY Bilateral 2005/2006   cataract  . LAPAROSCOPIC GASTRIC BANDING WITH HIATAL HERNIA REPAIR  2000  . NISSEN FUNDOPLICATION  1740  . OOPHORECTOMY Left   . TONSILLECTOMY AND ADENOIDECTOMY  1940    There were no vitals filed for this visit.   Subjective Assessment - 03/07/20 1259    Subjective She reports he is doing well today.  Denies pain upon arrival.  No falls or loss of balance since last therapy  session.  She was unable to perform her HEP due to forgetfulness.  No specific questions or concerns upon arrival.    Patient is accompained by: Family member    Pertinent History Pt presents to OP PT for evaluation of unsteadiness on feet. Pt referred by Neurologist who sees her for Lewy Body Dementian diagnosis, new as of a few months ago. Son present during evaluation to provide history, reports 1 close-call wherein he provided physical assistance to  prevent fall. Son reports pt AMB with a shufle and appears to catch her toes on sudden chances in surface height.    How long can you sit comfortably? N/A    How long can you stand comfortably? N/A    How long can you walk comfortably? ~ 1/2 mile    Diagnostic tests being followed for new dx of lewy body dementia    Currently in Pain? No/denies             TREATMENT   Ther-ex  NuStep L3-4 x 5 minutes for warm-up during history (3 minutes unbilled); Precor BLE leg press 55# 2 x 20; Sit to stand without UE support and 2kg med ball overhead press x 10, added Airex pad under feet x 5;  Seated exercises with manual resistance from therapist; Hip flexion marching x 10 BLE; Hip  abduction x 10 BLE; Hip adduction x 10 BLE; LAQ curls x 10 BLE; Heel/toe raises x 10 BLE;  Standing heel/toe raises with BUE support x 10;    Neuromuscular Re-education  All balance exercises performed without UE support and CGA provided by therapist throughout unless otherwise noted: Alternating 6" step taps x 10 BLE; Airex alternating 6" step taps x 10 BLE; Airex NBOS eyes open/closed x 30s each; Staggerred stance balance with front foot on 6" step and rear foot on Airex pad alternating forward LE x 30s each; Airex NBOS ball passes around body with return pass to other side, therapist adjusting distance and direction from body x multiple bouts on each side; 6" orange hurdle forward steps alternating leading LE x 10 each; 1/2 foam roll (round side up) A/P  balance x 60s; Tandem balance on firm ground alternating forward LE x 30s each;   Pt educated throughout session about proper posture and technique with exercises. Improved exercise technique, movement at target joints, use of target muscles after min to mod verbal, visual, tactile cues.    Patient demonstrates excellent motivation during session.  Initiated leg press today which is challenging for patient but she is able to complete with encouragement.  Continued with bilateral lower extremity strengthening in both sitting and standing positions.  Continued with standing balance exercises including unstable surfaces such as Airex pad and half foam roll.  Patient was not able to be consistent with her HEP after last session so reinforced the importance of consistency at home.  Patient encouraged to follow-up as scheduled. She will benefit from PT services to address deficits in strength, balance, and mobility in order to return to full function at home.                            PT Short Term Goals - 02/22/20 1515      PT SHORT TERM GOAL #1   Title After 4 weeks pt to demonstrate 10MWT >0.57m/s    Baseline 0.37m/s at eval;    Time 4    Period Weeks    Status New    Target Date 03/21/20      PT SHORT TERM GOAL #2   Title After 4 weeks patient to demonstrate improved SLS balance to >5 sec bilat and supervision level assistance.    Baseline <3 seconds at minGuard assist at eval    Time 4    Period Weeks    Status New    Target Date 03/21/20             PT Long Term Goals - 02/22/20 1518      PT LONG TERM GOAL #1   Title After 8 weeks pt to demonstrate FOTO score >71 to show improved functional independence.    Baseline 64 at eval    Time 8    Period Weeks    Status New    Target Date 04/18/20      PT LONG TERM GOAL #2   Title After 8 weeks pt to demonstrate improved DGI to >21 to show decreased falls risk.    Baseline At eval: 18/24.    Time 8     Period Weeks    Status New    Target Date 04/18/20      PT LONG TERM GOAL #3   Title After 12 weeks pt to demonstrate improved 6MWT to 122ft (age-matched norm value for females 80-89yo)  Time 12    Period Weeks    Status New    Target Date 05/16/20      PT LONG TERM GOAL #4   Title After 12 weeks pt to demonstrate 10MWT>1.66m/s to show reduced falls risk from a gait standpoint.    Baseline 0.40m/s @ eval    Time 12    Period Weeks    Status New    Target Date 05/16/20                 Plan - 03/07/20 1259    Clinical Impression Statement Patient demonstrates excellent motivation during session.  Initiated leg press today which is challenging for patient but she is able to complete with encouragement.  Continued with bilateral lower extremity strengthening in both sitting and standing positions.  Continued with standing balance exercises including unstable surfaces such as Airex pad and half foam roll.  Patient was not able to be consistent with her HEP after last session so reinforced the importance of consistency at home.  Patient encouraged to follow-up as scheduled. She will benefit from PT services to address deficits in strength, balance, and mobility in order to return to full function at home.    Personal Factors and Comorbidities Age;Behavior Pattern    Examination-Activity Limitations Squat;Stand;Stairs;Sit;Transfers    Examination-Participation Restrictions Cleaning;Yard Work;Community Activity    Rehab Potential Good    PT Frequency 2x / week    PT Duration 12 weeks    PT Treatment/Interventions Therapeutic exercise;Manual techniques;Moist Heat;Ultrasound;Electrical Stimulation;Gait training;Stair training;Functional mobility training;Therapeutic activities;Balance training;Neuromuscular re-education;Patient/family education;Passive range of motion;Energy conservation;Orthotic Fit/Training;Joint Manipulations    PT Next Visit Plan Review HEP with patient, progress  balance and strength, consider adding additional ankle strengthening    PT Home Exercise Plan Access Code: [email or web address]    Consulted and Agree with Plan of Care Patient           Patient will benefit from skilled therapeutic intervention in order to improve the following deficits and impairments:  Abnormal gait, Decreased balance, Decreased range of motion, Improper body mechanics, Decreased activity tolerance, Decreased knowledge of use of DME, Decreased strength, Postural dysfunction  Visit Diagnosis: Unsteadiness on feet  Other abnormalities of gait and mobility     Problem List Patient Active Problem List   Diagnosis Date Noted  . Change in hearing 01/17/2020  . Balance problem 01/17/2020  . Change in vision 11/26/2019  . Hypercholesterolemia 07/01/2019  . Elevated TSH 07/02/2018  . Right hip pain 07/02/2018  . Sweating abnormality 12/28/2017  . Aortic atherosclerosis ([place]) 10/29/2016  . B12 deficiency 10/29/2015  . Anemia 08/28/2015  . Cough 06/28/2015  . Atrial fibrillation ([place]) 06/08/2015  . Left hip pain 04/05/2015  . Osteoporosis, post-menopausal 10/10/2014  . Pain of left thumb 08/06/2014  . Health care maintenance 08/06/2014  . Osteoporosis 07/28/2014  . Bronchiectasis without acute exacerbation ([place]) 01/20/2014  . Multiple pulmonary nodules 01/20/2014  . Abnormal CXR 11/02/2013  . Chest pain 11/01/2013  . Fatigue 11/01/2013  . Burning sensation in lower extremity 11/01/2013  . Environmental allergies 04/19/2013  . Arthritis 01/18/2013  . Depression 01/18/2013  . GERD (gastroesophageal reflux disease) 01/18/2013  . Glaucoma 01/18/2013  . History of colonic polyps 01/18/2013  . Carotid stenosis 01/18/2013   Phillips Grout PT, DPT, GCS  Bridget Briggs 03/07/2020, 2:42 PM  [place] MAIN [facility] SERVICES [address] [place], [place], [address] Phone: [phone]   Fax:  [phone]  Name: Bridget Briggs  MRN:  [id number] Date of Birth: July 29, 1930

## 2020-03-09 ENCOUNTER — Other Ambulatory Visit: Payer: Self-pay

## 2020-03-09 ENCOUNTER — Ambulatory Visit: Payer: Medicare Other

## 2020-03-09 VITALS — BP 119/74 | HR 66

## 2020-03-09 DIAGNOSIS — R2681 Unsteadiness on feet: Secondary | ICD-10-CM

## 2020-03-09 DIAGNOSIS — R2689 Other abnormalities of gait and mobility: Secondary | ICD-10-CM

## 2020-03-09 NOTE — Therapy (Signed)
[place] MAIN [facility] SERVICES [address] [place], [place], [address] Phone: [phone]   Fax:  [phone]  Physical Therapy Treatment  Patient Details  Name: Bridget Briggs MRN: [id number] Date of Birth: Feb 04, 1931 Referring Provider (PT): Jennings Books (neurology)    Encounter Date: 03/09/2020   PT End of Session - 03/09/20 1437    Visit Number 4    Number of Visits 17    Date for PT Re-Evaluation 05/16/20    Authorization Type BCBS Medicare    Authorization Time Period 02/22/20-05/16/20    PT Start Time 1432    PT Stop Time 1515    PT Time Calculation (min) 43 min    Activity Tolerance Patient tolerated treatment well;No increased pain    Behavior During Therapy WFL for tasks assessed/performed           Past Medical History:  Diagnosis Date  . Arthritis   . Depression   . GERD (gastroesophageal reflux disease)   . Glaucoma   . History of chicken pox   . History of colon polyps   . Hx: UTI (urinary tract infection)     Past Surgical History:  Procedure Laterality Date  . ABDOMINAL HYSTERECTOMY  1974   left ovary removed  . ANKLE SURGERY Left 1998  . APPENDECTOMY  1974  . BREAST BIOPSY Right 1988   NEG  . BREAST SURGERY Right 1988   biopsy  . CARDIAC CATHETERIZATION N/A 09/06/2015   Procedure: Right/Left Heart Cath and Coronary Angiography;  Surgeon: Yolonda Kida, MD;  Location: [facility];  Service: Cardiovascular;  Laterality: N/A;  . CHOLECYSTECTOMY  1996  . EYE SURGERY Bilateral 2005/2006   cataract  . LAPAROSCOPIC GASTRIC BANDING WITH HIATAL HERNIA REPAIR  2000  . NISSEN FUNDOPLICATION  5102  . OOPHORECTOMY Left   . TONSILLECTOMY AND ADENOIDECTOMY  1940    Vitals:   03/09/20 1501  BP: 119/74  Pulse: 66  SpO2: 99%     Subjective Assessment - 03/09/20 1437    Subjective She reports he is doing well today.  Denies pain upon arrival.  No falls or loss of balance since last therapy session.  She did perform her HEP yesterday without issue. No specific questions or concerns upon arrival.    Patient is accompained by: Family member    Pertinent History Pt presents to OP PT for evaluation of unsteadiness on feet. Pt referred by Neurologist who sees her for Lewy Body Dementian diagnosis, new as of a few months ago. Son present during evaluation to provide history, reports 1 close-call wherein he provided physical assistance to  prevent fall. Son reports pt AMB with a shufle and appears to catch her toes on sudden chances in surface height.    How long can you sit comfortably? N/A    How long can you stand comfortably? N/A    How long can you walk comfortably? ~ 1/2 mile    Diagnostic tests being followed for new dx of lewy body dementia    Currently in Pain? No/denies             TREATMENT   Ther-ex  NuStep L3-4 x 5 minutes for warm-up during history (3 minutes unbilled); Precor BLE leg press 60# x 20, 70# x 15 (limited repetitions during second set due to onset of L knee pain);  Standing exercises with 3# ankle weights: Hip flexion marching x 20 BLE; Hip abduction x 20 BLE; Hip extension x  20 BLE; HS curls x 20 BLE;  Standing heel/toe raises with BUE support x 20; Sit to stand without UE support and Airex pad under feet x 10, minA+1 for last couple reps;    Neuromuscular Re-education  All balance exercises performed without UE support and CGA provided by therapist throughout unless otherwise noted: Forward BOSU lunges (round side up) without UE support x 10 with each LE; Rockerboard balance A/P eyes open/closed x 30s each; Rockerboard A/P horizontal and vertical head turns x 30s each; Rockerboard balance R/L eyes open/closed x 30s each; Rockerboard R/L horizontal and vertical head turns x 30s each; Airex half tandem balance alternating forward LE x 30s each;   Pt educated throughout session about proper posture and technique with exercises. Improved exercise  technique, movement at target joints, use of target muscles after min to mod verbal, visual, tactile cues.    Patient demonstrates excellent motivation during session. Progressed resistance on leg press today which is challenging for patient but she is able to complete with encouragement.  Continued with bilateral lower extremity strengthening in both sitting and standing positions. Increased number of repetitions for standing exercises. Continued with standing balance exercises including unstable surfaces such as Airex pad and rockerboard. Pt finds rockerboard challenging and requires regular encouragement. Pt was able to perform her HEP yesterday and encouragement provided for her to be consistent prior to next therapy session. If she is able to remain consistent will consider progressing HEP. Discussed with son the possibility of adding ankle weights to her exercises. Patient encouraged to follow-up as scheduled. She will benefit from PT services to address deficits in strength, balance, and mobility in order to return to full function at home.                              PT Short Term Goals - 02/22/20 1515      PT SHORT TERM GOAL #1   Title After 4 weeks pt to demonstrate 10MWT >0.44m/s    Baseline 0.51m/s at eval;    Time 4    Period Weeks    Status New    Target Date 03/21/20      PT SHORT TERM GOAL #2   Title After 4 weeks patient to demonstrate improved SLS balance to >5 sec bilat and supervision level assistance.    Baseline <3 seconds at minGuard assist at eval    Time 4    Period Weeks    Status New    Target Date 03/21/20             PT Long Term Goals - 02/22/20 1518      PT LONG TERM GOAL #1   Title After 8 weeks pt to demonstrate FOTO score >71 to show improved functional independence.    Baseline 64 at eval    Time 8    Period Weeks    Status New    Target Date 04/18/20      PT LONG TERM GOAL #2   Title After 8 weeks pt to demonstrate  improved DGI to >21 to show decreased falls risk.    Baseline At eval: 18/24.    Time 8    Period Weeks    Status New    Target Date 04/18/20      PT LONG TERM GOAL #3   Title After 12 weeks pt to demonstrate improved 6MWT to 1236ft (age-matched norm value for females 80-84yo)  Time 12    Period Weeks    Status New    Target Date 05/16/20      PT LONG TERM GOAL #4   Title After 12 weeks pt to demonstrate 10MWT>1.4m/s to show reduced falls risk from a gait standpoint.    Baseline 0.44m/s @ eval    Time 12    Period Weeks    Status New    Target Date 05/16/20                 Plan - 03/09/20 1438    Clinical Impression Statement Patient demonstrates excellent motivation during session. Progressed resistance on leg press today which is challenging for patient but she is able to complete with encouragement.  Continued with bilateral lower extremity strengthening in both sitting and standing positions. Increased number of repetitions for standing exercises. Continued with standing balance exercises including unstable surfaces such as Airex pad and rockerboard. Pt finds rockerboard challenging and requires regular encouragement. Pt was able to perform her HEP yesterday and encouragement provided for her to be consistent prior to next therapy session. If she is able to remain consistent will consider progressing HEP. Discussed with son the possibility of adding ankle weights to her exercises. Patient encouraged to follow-up as scheduled. She will benefit from PT services to address deficits in strength, balance, and mobility in order to return to full function at home.    Personal Factors and Comorbidities Age;Behavior Pattern    Examination-Activity Limitations Squat;Stand;Stairs;Sit;Transfers    Examination-Participation Restrictions Cleaning;Yard Work;Community Activity    Rehab Potential Good    PT Frequency 2x / week    PT Duration 12 weeks    PT Treatment/Interventions  Therapeutic exercise;Manual techniques;Moist Heat;Ultrasound;Electrical Stimulation;Gait training;Stair training;Functional mobility training;Therapeutic activities;Balance training;Neuromuscular re-education;Patient/family education;Passive range of motion;Energy conservation;Orthotic Fit/Training;Joint Manipulations    PT Next Visit Plan Review HEP with patient, progress balance and strength, consider adding additional ankle strengthening    PT Home Exercise Plan Access Code: [email or web address]    Consulted and Agree with Plan of Care Patient           Patient will benefit from skilled therapeutic intervention in order to improve the following deficits and impairments:  Abnormal gait, Decreased balance, Decreased range of motion, Improper body mechanics, Decreased activity tolerance, Decreased knowledge of use of DME, Decreased strength, Postural dysfunction  Visit Diagnosis: Unsteadiness on feet  Other abnormalities of gait and mobility     Problem List Patient Active Problem List   Diagnosis Date Noted  . Change in hearing 01/17/2020  . Balance problem 01/17/2020  . Change in vision 11/26/2019  . Hypercholesterolemia 07/01/2019  . Elevated TSH 07/02/2018  . Right hip pain 07/02/2018  . Sweating abnormality 12/28/2017  . Aortic atherosclerosis ([place]) 10/29/2016  . B12 deficiency 10/29/2015  . Anemia 08/28/2015  . Cough 06/28/2015  . Atrial fibrillation ([place]) 06/08/2015  . Left hip pain 04/05/2015  . Osteoporosis, post-menopausal 10/10/2014  . Pain of left thumb 08/06/2014  . Health care maintenance 08/06/2014  . Osteoporosis 07/28/2014  . Bronchiectasis without acute exacerbation ([place]) 01/20/2014  . Multiple pulmonary nodules 01/20/2014  . Abnormal CXR 11/02/2013  . Chest pain 11/01/2013  . Fatigue 11/01/2013  . Burning sensation in lower extremity 11/01/2013  . Environmental allergies 04/19/2013  . Arthritis 01/18/2013  . Depression 01/18/2013  . GERD (gastroesophageal reflux  disease) 01/18/2013  . Glaucoma 01/18/2013  . History of colonic polyps 01/18/2013  . Carotid stenosis 01/18/2013   Phillips Grout PT, DPT,  GCS  Amiel Sharrow 03/10/2020, 8:27 AM  [place] MAIN [facility] SERVICES [address] [place], [place], [address] Phone: [phone]   Fax:  [phone]  Name: SOLOMIYA PASCALE MRN: [id number] Date of Birth: [birth year]

## 2020-03-13 ENCOUNTER — Ambulatory Visit: Payer: Medicare Other | Admitting: Nurse Practitioner

## 2020-03-13 ENCOUNTER — Encounter: Payer: Self-pay | Admitting: Nurse Practitioner

## 2020-03-13 ENCOUNTER — Other Ambulatory Visit: Payer: Self-pay

## 2020-03-13 VITALS — BP 114/72 | HR 78 | Temp 98.1°F | Ht 62.0 in | Wt 184.0 lb

## 2020-03-13 DIAGNOSIS — I480 Paroxysmal atrial fibrillation: Secondary | ICD-10-CM | POA: Diagnosis not present

## 2020-03-13 DIAGNOSIS — R61 Generalized hyperhidrosis: Secondary | ICD-10-CM

## 2020-03-13 NOTE — Patient Instructions (Addendum)
Continue to walk daily and monitor for any new symptoms such as chest pain or pressure, dizziness, shortness of breath and promptly report to Dr. Clayborn Bigness if those occur.   Your EKG shows you are back in A fib and your heart rate is in the 70's which is good. Your BP today is 114/72. Please take your usual medications.   I will let Dr. Clayborn Bigness know you are concerned about the neck perspiration with walking the mall lap and review your EKG results.   Please follow up with Dr. Nicki Reaper in 1 month to follow.    Atrial Fibrillation  Atrial fibrillation is a type of irregular or rapid heartbeat (arrhythmia). In atrial fibrillation, the top part of the heart (atria) beats in an irregular pattern. This makes the heart unable to pump blood normally and effectively. The goal of treatment is to prevent blood clots from forming, control your heart rate, or restore your heartbeat to a normal rhythm. If this condition is not treated, it can cause serious problems, such as a weakened heart muscle (cardiomyopathy) or a stroke. What are the causes? This condition is often caused by medical conditions that damage the heart's electrical system. These include:  High blood pressure (hypertension). This is the most common cause.  Certain heart problems or conditions, such as heart failure, coronary artery disease, heart valve problems, or heart surgery.  Diabetes.  Overactive thyroid (hyperthyroidism).  Obesity.  Chronic kidney disease. In some cases, the cause of this condition is not known. What increases the risk? This condition is more likely to develop in:  Older people.  People who smoke.  Athletes who do endurance exercise.  People who have a family history of atrial fibrillation.  Men.  People who use drugs.  People who drink a lot of alcohol.  People who have lung conditions, such as emphysema, pneumonia, or COPD.  People who have obstructive sleep apnea. What are the signs or  symptoms? Symptoms of this condition include:  A feeling that your heart is racing or beating irregularly.  Discomfort or pain in your chest.  Shortness of breath.  Sudden light-headedness or weakness.  Tiring easily during exercise or activity.  Fatigue.  Syncope (fainting).  Sweating. In some cases, there are no symptoms. How is this diagnosed? Your health care provider may detect atrial fibrillation when taking your pulse. If detected, this condition may be diagnosed with:  An electrocardiogram (ECG) to check electrical signals of the heart.  An ambulatory cardiac monitor to record your heart's activity for a few days.  A transthoracic echocardiogram (TTE) to create pictures of your heart.  A transesophageal echocardiogram (TEE) to create even closer pictures of your heart.  A stress test to check your blood supply while you exercise.  Imaging tests, such as a CT scan or chest X-ray.  Blood tests. How is this treated? Treatment depends on underlying conditions and how you feel when you experience atrial fibrillation. This condition may be treated with:  Medicines to prevent blood clots or to treat heart rate or heart rhythm problems.  Electrical cardioversion to reset the heart's rhythm.  A pacemaker to correct abnormal heart rhythm.  Ablation to remove the heart tissue that sends abnormal signals.  Left atrial appendage closure to seal the area where blood clots can form. In some cases, underlying conditions will be treated. Follow these instructions at home: Medicines  Take over-the counter and prescription medicines only as told by your health care provider.  Do not take  any new medicines without talking to your health care provider.  If you are taking blood thinners: ? Talk with your health care provider before you take any medicines that contain aspirin or NSAIDs, such as ibuprofen. These medicines increase your risk for dangerous bleeding. ? Take your  medicine exactly as told, at the same time every day. ? Avoid activities that could cause injury or bruising, and follow instructions about how to prevent falls. ? Wear a medical alert bracelet or carry a card that lists what medicines you take. Lifestyle      Do not use any products that contain nicotine or tobacco, such as cigarettes, e-cigarettes, and chewing tobacco. If you need help quitting, ask your health care provider.  Eat heart-healthy foods. Talk with a dietitian to make an eating plan that is right for you.  Exercise regularly as told by your health care provider.  Do not drink alcohol.  Lose weight if you are overweight.  Do not use drugs, including cannabis. General instructions  If you have obstructive sleep apnea, manage your condition as told by your health care provider.  Do not use diet pills unless your health care provider approves. Diet pills can make heart problems worse.  Keep all follow-up visits as told by your health care provider. This is important. Contact a health care provider if you:  Notice a change in the rate, rhythm, or strength of your heartbeat.  Are taking a blood thinner and you notice more bruising.  Tire more easily when you exercise or do heavy work.  Have a sudden change in weight. Get help right away if you have:   Chest pain, abdominal pain, sweating, or weakness.  Trouble breathing.  Side effects of blood thinners, such as blood in your vomit, stool, or urine, or bleeding that cannot stop.  Any symptoms of a stroke. "BE FAST" is an easy way to remember the main warning signs of a stroke: ? B - Balance. Signs are dizziness, sudden trouble walking, or loss of balance. ? E - Eyes. Signs are trouble seeing or a sudden change in vision. ? F - Face. Signs are sudden weakness or numbness of the face, or the face or eyelid drooping on one side. ? A - Arms. Signs are weakness or numbness in an arm. This happens suddenly and  usually on one side of the body. ? S - Speech. Signs are sudden trouble speaking, slurred speech, or trouble understanding what people say. ? T - Time. Time to call emergency services. Write down what time symptoms started.  Other signs of a stroke, such as: ? A sudden, severe headache with no known cause. ? Nausea or vomiting. ? Seizure. These symptoms may represent a serious problem that is an emergency. Do not wait to see if the symptoms will go away. Get medical help right away. Call your local emergency services (911 in the [place] Do not drive yourself to the hospital. Summary  Atrial fibrillation is a type of irregular or rapid heartbeat (arrhythmia).  Symptoms include a feeling that your heart is beating fast or irregularly.  You may be given medicines to prevent blood clots or to treat heart rate or heart rhythm problems.  Get help right away if you have signs or symptoms of a stroke.  Get help right away if you cannot catch your breath or have chest pain or pressure. This information is not intended to replace advice given to you by your health care provider. Make sure  you discuss any questions you have with your health care provider. Document Revised: 10/28/2018 Document Reviewed: 10/28/2018 Elsevier Patient Education  [place].

## 2020-03-13 NOTE — Progress Notes (Addendum)
Established Patient Office Visit  Subjective:  Patient ID: Bridget Briggs, female    DOB: October 27, 1930  Age: 86 y.o. MRN: [id number]  CC:  Chief Complaint  Patient presents with  . Acute Visit    sweating     HPI Bridget Briggs is an 86 yo with history of Lewy body dementia, paroxysmal atrial fibrillation, decreased hearing, imbalance who presents for sweating at her neck hairline when she walks one lap around the [facility]. She has noticed this perspiration  x 6 months and it only occurs when she walks a [place] around the mall.  She does not perspire any where else on the body. She denies any other associated symptoms such as chest pain, pressure, heaviness, tightness.  There is no shortness of breath or DOE.  No dizziness, lightheadedness, weakness, or fatigue.  She does not feel more tired than usual after exercising.  She had been working on her balance.  She has had no falls.  Patient is followed by Dr. Clayborn Bigness with history of paroxysmal atrial fib treated with metoprolol 12.5 mg twice daily and Eliquis 5 mg daily.  She is followed by Dr. Manuella Ghazi for her Lewy body dementia.  Patient presents today alone and is a good historian.  She has been having 6 months history of exertional sweatiness behind her neck, and has no particular reason why she made the appointment today.  She thought she would just get it checked out.  Patient saw Dr. Clayborn Bigness last month and did not mention her neck perspiration concerns.   BP Readings from Last 3 Encounters:  03/13/20 114/72  03/09/20 119/74  03/02/20 127/62    Past Medical History:  Diagnosis Date  . Arthritis   . Depression   . GERD (gastroesophageal reflux disease)   . Glaucoma   . History of chicken pox   . History of colon polyps   . Hx: UTI (urinary tract infection)     Past Surgical History:  Procedure Laterality Date  . ABDOMINAL HYSTERECTOMY  1974   left ovary removed  . ANKLE SURGERY Left 1998  . APPENDECTOMY  1974  . BREAST  BIOPSY Right 1988   NEG  . BREAST SURGERY Right 1988   biopsy  . CARDIAC CATHETERIZATION N/A 09/06/2015   Procedure: Right/Left Heart Cath and Coronary Angiography;  Surgeon: Yolonda Kida, MD;  Location: [facility];  Service: Cardiovascular;  Laterality: N/A;  . CHOLECYSTECTOMY  1996  . EYE SURGERY Bilateral 2005/2006   cataract  . LAPAROSCOPIC GASTRIC BANDING WITH HIATAL HERNIA REPAIR  2000  . NISSEN FUNDOPLICATION  2353  . OOPHORECTOMY Left   . TONSILLECTOMY AND ADENOIDECTOMY  1940    Family History  Problem Relation Age of Onset  . Arthritis Mother   . Leukemia Mother   . Lung cancer Father   . Osteoporosis Other   . Breast cancer Neg Hx     Social History   Socioeconomic History  . Marital status: Married    Spouse name: Not on file  . Number of children: Not on file  . Years of education: Not on file  . Highest education level: Not on file  Occupational History  . Not on file  Tobacco Use  . Smoking status: Former Smoker    Packs/day: 1.00    Years: 30.00    Pack years: 30.00    Types: Cigarettes    Quit date: 05/20/1988    Years since quitting: 31.8  . Smokeless  tobacco: Never Used  Vaping Use  . Vaping Use: Never used  Substance and Sexual Activity  . Alcohol use: No    Alcohol/week: 0.0 standard drinks  . Drug use: No  . Sexual activity: Never  Other Topics Concern  . Not on file  Social History Narrative  . Not on file   Social Determinants of Health   Financial Resource Strain: Low Risk   . Difficulty of Paying Living Expenses: Not hard at all  Food Insecurity: No Food Insecurity  . Worried About Charity fundraiser in the Last Year: Never true  . Ran Out of Food in the Last Year: Never true  Transportation Needs: No Transportation Needs  . Lack of Transportation (Medical): No  . Lack of Transportation (Non-Medical): No  Physical Activity: Insufficiently Active  . Days of Exercise per Week: 5 days  . Minutes of Exercise per  Session: 20 min  Stress: No Stress Concern Present  . Feeling of Stress : Not at all  Social Connections: Moderately Isolated  . Frequency of Communication with Friends and Family: Once a week  . Frequency of Social Gatherings with Friends and Family: Once a week  . Attends Religious Services: 1 to 4 times per year  . Active Member of Clubs or Organizations: Yes  . Attends Archivist Meetings: 1 to 4 times per year  . Marital Status: Widowed  Intimate Partner Violence: Not At Risk  . Fear of Current or Ex-Partner: No  . Emotionally Abused: No  . Physically Abused: No  . Sexually Abused: No    Outpatient Medications Prior to Visit  Medication Sig Dispense Refill  . apixaban (ELIQUIS) 5 MG TABS tablet Take 1 tablet (5 mg total) by mouth 2 (two) times daily. 60 tablet 0  . APOAEQUORIN PO Take by mouth.    . beta carotene w/minerals (OCUVITE) tablet Take 1 tablet by mouth daily.    . Blood Pressure Monitor MISC Use 1 each as directed    . calcium-vitamin D (OSCAL WITH D) 500-200 MG-UNIT per tablet Take 1 tablet by mouth daily.    . cholecalciferol (VITAMIN D3) 25 MCG (1000 UNIT) tablet Take 1,000 Units by mouth daily.     . diphenhydramine-acetaminophen (TYLENOL PM) 25-500 MG TABS Take 1 tablet by mouth at bedtime as needed.    . dorzolamide-timolol (COSOPT) 22.3-6.8 MG/ML ophthalmic solution Place 1 drop into both eyes 2 (two) times daily.    . fluocinolone (SYNALAR) 0.01 % external solution Apply topically 2 (two) times daily as needed.    . latanoprost (XALATAN) 0.005 % ophthalmic solution Place 1 drop into both eyes at bedtime.    . metoprolol tartrate (LOPRESSOR) 25 MG tablet Take 12.5 mg by mouth 2 (two) times daily.     . Multiple Vitamins-Minerals (CENTRUM SILVER PO) Take by mouth.    . rosuvastatin (CRESTOR) 5 MG tablet TAKE 1 TABLET BY MOUTH EVERY MONDAY, WEDNESDAY AND FRIDAY 40 tablet 1   No facility-administered medications prior to visit.    Allergies  Allergen  Reactions  . Penicillins Other (See Comments)    Has patient had a PCN reaction causing immediate rash, facial/tongue/throat swelling, SOB or lightheadedness with hypotension: no  Has patient had a PCN reaction causing severe rash involving mucus membranes or skin necrosis: no Has patient had a PCN reaction that required hospitalization: no Has patient had a PCN reaction occurring within the last 10 years: yes  If all of the above answers are "NO",  then may proceed with Cephalosporin use.     Review of Systems Pertinent positives noted in history of present illness and otherwise negative.   Objective:    Physical Exam Vitals reviewed.  Constitutional:      Appearance: Normal appearance.  HENT:     Head: Normocephalic and atraumatic.  Eyes:     Conjunctiva/sclera: Conjunctivae normal.     Pupils: Pupils are equal, round, and reactive to light.  Neck:     Vascular: No carotid bruit.  Cardiovascular:     Rate and Rhythm: Normal rate. Rhythm irregular.     Pulses: Normal pulses.     Heart sounds: Normal heart sounds.     Comments: Atrial fib with HR 72  Pulmonary:     Effort: Pulmonary effort is normal.     Breath sounds: Normal breath sounds. No rales.  Abdominal:     Palpations: Abdomen is soft.     Tenderness: There is no abdominal tenderness.  Musculoskeletal:        General: Normal range of motion.     Cervical back: Normal range of motion and neck supple. No rigidity.  Lymphadenopathy:     Cervical: No cervical adenopathy.  Skin:    General: Skin is warm and dry.  Neurological:     Mental Status: She is alert and oriented to person, place, and time.  Psychiatric:        Mood and Affect: Mood normal.        Behavior: Behavior normal.     BP 114/72 (BP Location: Left Arm, Patient Position: Sitting, Cuff Size: Normal)   Pulse 78   Temp 98.1 F (36.7 C) (Oral)   Ht 5\' 2"  (1.575 m)   Wt 184 lb (83.5 kg)   SpO2 97%   BMI 33.65 kg/m  Wt Readings from Last 3  Encounters:  03/13/20 184 lb (83.5 kg)  12/20/19 181 lb 12.8 oz (82.5 kg)  11/24/19 186 lb (84.4 kg)   Pulse Readings from Last 3 Encounters:  03/13/20 78  03/09/20 66  03/02/20 84    BP Readings from Last 3 Encounters:  03/13/20 114/72  03/09/20 119/74  03/02/20 127/62    Lab Results  Component Value Date   CHOL 152 11/24/2019   HDL 56.50 11/24/2019   LDLCALC 69 11/24/2019   TRIG 131.0 11/24/2019   CHOLHDL 3 11/24/2019      Health Maintenance Due  Topic Date Due  . TETANUS/TDAP  Never done    There are no preventive care reminders to display for this patient.  Lab Results  Component Value Date   TSH 3.99 11/24/2019   Lab Results  Component Value Date   WBC 7.0 11/24/2019   HGB 13.9 11/24/2019   HCT 41.8 11/24/2019   MCV 86.6 11/24/2019   PLT 204.0 11/24/2019   Lab Results  Component Value Date   NA 136 11/24/2019   K 4.7 11/24/2019   CO2 28 11/24/2019   GLUCOSE 105 (H) 11/24/2019   BUN 16 11/24/2019   CREATININE 0.80 11/24/2019   BILITOT 0.9 11/24/2019   ALKPHOS 58 11/24/2019   AST 20 11/24/2019   ALT 17 11/24/2019   PROT 6.8 11/24/2019   ALBUMIN 4.2 11/24/2019   CALCIUM 9.9 11/24/2019   ANIONGAP 6 06/10/2015   GFR 67.88 11/24/2019   Lab Results  Component Value Date   CHOL 152 11/24/2019   Lab Results  Component Value Date   HDL 56.50 11/24/2019   Lab Results  Component  Value Date   LDLCALC 69 11/24/2019   Lab Results  Component Value Date   TRIG 131.0 11/24/2019   Lab Results  Component Value Date   CHOLHDL 3 11/24/2019   No results found for: HGBA1C    Assessment & Plan:   Problem List Items Addressed This Visit      Other   Diaphoresis - Primary   Relevant Orders   EKG 12-Lead      No orders of the defined types were placed in this encounter. EKG was ordered today because of her history of exertional sweatiness.  She gets sweaty behind her neck onset 6 months ago it only occurs when she walks 1 lap around the [employer].  She can walk around and do housework and have no problems.  She has no associated symptoms.  Patient is well-appearing today, skin is warm and dry, color is good.  She is alert interacting with a normal.  Steady on her feet.  EKG today shows atrial flutter- fibrillation with low voltage in precordial leads.  Heart rate on EKG recorded at 102.  Manual heart rate by me is 72.  Patient with known history of paroxysmal atrial fibrillation taking her Eliquis and a beta-blocker as directed.  EKG performed on 11/24/2019 showed sinus bradycardia  Follow-up: No follow-ups on file.  Continue to walk daily and monitor for any new symptoms such as chest pain or pressure, dizziness, shortness of breath and promptly report to Dr. Clayborn Bigness if those occur.   Your EKG shows you are back in A fib and your heart rate is in the 70's which is good. Your BP today is 114/72. Please take your usual medications.   I will let Dr. Clayborn Bigness know you are concerned about the neck perspiration with walking the mall lap and review your EKG results.   Please follow up with Dr. Nicki Reaper in 1 month to follow.   Bridget Paradise, NP

## 2020-03-13 NOTE — Patient Instructions (Addendum)
Access Code: [email or web address] URL: [email or web address] Date: 03/14/2020 Prepared by: Roxana Hires  Exercises Seated Hip Flexion March with Ankle Weights - 1 x daily - 7 x weekly - 2 sets - 10 reps - 3s hold Sit to Stand without Arm Support - 1 x daily - 7 x weekly - 2 sets - 10 reps Seated Long Arc Quad with Ankle Weight - 1 x daily - 7 x weekly - 2 sets - 10 reps - 3s hold Standing Tandem Balance with Counter Support - 1 x daily - 7 x weekly - 30s x 2 with each foot forward hold Standing March with Counter Support - 1 x daily - 7 x weekly - 2 sets - 10 reps - slowly hold Heel Toe Raises with Counter Support - 1 x daily - 7 x weekly - 2 sets - 10 reps - 3s hold

## 2020-03-14 ENCOUNTER — Ambulatory Visit: Payer: Medicare Other

## 2020-03-14 DIAGNOSIS — R2681 Unsteadiness on feet: Secondary | ICD-10-CM | POA: Diagnosis not present

## 2020-03-14 DIAGNOSIS — R2689 Other abnormalities of gait and mobility: Secondary | ICD-10-CM

## 2020-03-14 NOTE — Therapy (Signed)
[place] MAIN [facility] SERVICES [address] [place], [place], [address] Phone: [phone]   Fax:  [phone]  Physical Therapy Treatment  Patient Details  Name: Bridget Briggs MRN: [id number] Date of Birth: [birth year] Referring Provider (PT): Jennings Books (neurology)    Encounter Date: 03/15/83   PT End of Session - 03/14/20 1257    Visit Number 5    Number of Visits 17    Date for PT Re-Evaluation 05/16/20    Authorization Type BCBS Medicare    Authorization Time Period 02/22/20-05/16/20    PT Start Time 84    PT Stop Time 1345    PT Time Calculation (min) 45 min    Activity Tolerance Patient tolerated treatment well;No increased pain    Behavior During Therapy WFL for tasks assessed/performed           Past Medical History:  Diagnosis Date   Arthritis    Depression    GERD (gastroesophageal reflux disease)    Glaucoma    History of chicken pox    History of colon polyps    Hx: UTI (urinary tract infection)     Past Surgical History:  Procedure Laterality Date   ABDOMINAL HYSTERECTOMY  1974   left ovary removed   ANKLE SURGERY Left 1998   APPENDECTOMY  1974   BREAST BIOPSY Right 1988   NEG   BREAST SURGERY Right 1988   biopsy   CARDIAC CATHETERIZATION N/A 09/06/2015   Procedure: Right/Left Heart Cath and Coronary Angiography;  Surgeon: Yolonda Kida, MD;  Location: [facility];  Service: Cardiovascular;  Laterality: N/A;   CHOLECYSTECTOMY  1996   EYE SURGERY Bilateral 2005/2006   cataract   LAPAROSCOPIC GASTRIC BANDING WITH HIATAL HERNIA REPAIR  4315   NISSEN FUNDOPLICATION  4008   OOPHORECTOMY Left    TONSILLECTOMY AND ADENOIDECTOMY  1940    There were no vitals filed for this visit.   Subjective Assessment - 03/14/20 1256    Subjective She reports she is doing well today.  Denies pain upon arrival.  No falls or loss of balance since last therapy session. She states that she  has been trying to do her HEP everyday but sometimes is not able to complete all the exercises. No specific questions or concerns upon arrival.    Patient is accompained by: Family member    Pertinent History Pt presents to OP PT for evaluation of unsteadiness on feet. Pt referred by Neurologist who sees her for Lewy Body Dementian diagnosis, new as of a few months ago. Son present during evaluation to provide history, reports 1 close-call wherein he provided physical assistance to  prevent fall. Son reports pt AMB with a shufle and appears to catch her toes on sudden chances in surface height.    How long can you sit comfortably? N/A    How long can you stand comfortably? N/A    How long can you walk comfortably? ~ 1/2 mile    Diagnostic tests being followed for new dx of lewy body dementia    Currently in Pain? No/denies             TREATMENT   Ther-ex  NuStep L2-4 x 5 minutes for warm-up during history,therapist adjusting resistance throughout (4 minutes unbilled); Precor BLE leg press 60# x 20, 70# x 20, 85# x 10; 6" alternating step-ups without UE support x 10 leading with each leg; Forward BOSU lunges (round side up) without UE support  x 10 with each LE; Sit to stand without UE support x 10, added Airex pad under feet x 10, pt requires minA+1 for the last 2 reps due to fatigue;    Neuromuscular Re-education  All balance exercises performed without UE support and CGA provided by therapist throughout unless otherwise noted: Rockerboard A/P static balance eyes open/closed x 30s each; Rockerboard A/P heel/toe rocks x 30s; Rockerboard A/Phorizontal and vertical head turns x 30 seconds each; Rockerboard R/L static balance eyes open/closed x 30s each; Rockerboard R/L lateral weight shifts x 30s; Rockerboard R/Lhorizontal and vertical head turns x 30 seconds each; Reviewed and performed additional HEP with patient including semitandem balance alternating forward LE x 30s each,  slow marches x 10 BLE, and standing heel/toe lifts 3s hold x 10 each with BUE support; Education provided to patient and son about additional HEP;   Pt educated throughout session about proper posture and technique with exercises. Improved exercise technique, movement at target joints, use of target muscles after min to mod verbal, visual, tactile cues.    Patient demonstrates excellent motivation during session. Progressed resistance on leg press today to 85# for last set. She does report mild L knee pain but it does not limit her ability to complete her sets today. Continued with bilateral lower extremity strengthening in both sitting and standing positions and also continued with standing balance exercises including unstable surfaces such as rockerboard. Progressed HEP to include some balance exercises at home and education provided to patient and son regarding how she can complete safely at home next to a stable surface such as a counter or in the corner of a room. Patient encouraged to follow-up as scheduled. She will benefit from PT services to address deficits in strength, balance, and mobility in order to return to full function at home.                              PT Short Term Goals - 02/22/20 1515      PT SHORT TERM GOAL #1   Title After 4 weeks pt to demonstrate 10MWT >0.3m/s    Baseline 0.54m/s at eval;    Time 4    Period Weeks    Status New    Target Date 03/21/20      PT SHORT TERM GOAL #2   Title After 4 weeks patient to demonstrate improved SLS balance to >5 sec bilat and supervision level assistance.    Baseline <3 seconds at minGuard assist at eval    Time 4    Period Weeks    Status New    Target Date 03/21/20             PT Long Term Goals - 02/22/20 1518      PT LONG TERM GOAL #1   Title After 8 weeks pt to demonstrate FOTO score >71 to show improved functional independence.    Baseline 64 at eval    Time 8    Period Weeks     Status New    Target Date 04/18/20      PT LONG TERM GOAL #2   Title After 8 weeks pt to demonstrate improved DGI to >21 to show decreased falls risk.    Baseline At eval: 18/24.    Time 8    Period Weeks    Status New    Target Date 04/18/20      PT LONG TERM GOAL #3  Title After 12 weeks pt to demonstrate improved 6MWT to 1281ft (age-matched norm value for females 80-89yo)    Time 12    Period Weeks    Status New    Target Date 05/16/20      PT LONG TERM GOAL #4   Title After 12 weeks pt to demonstrate 10MWT>1.37m/s to show reduced falls risk from a gait standpoint.    Baseline 0.65m/s @ eval    Time 12    Period Weeks    Status New    Target Date 05/16/20                 Plan - 03/14/20 1258    Clinical Impression Statement Patient demonstrates excellent motivation during session. Progressed resistance on leg press today to 85# for last set. She does report mild L knee pain but it does not limit her ability to complete her sets today. Continued with bilateral lower extremity strengthening in both sitting and standing positions and also continued with standing balance exercises including unstable surfaces such as rockerboard. Progressed HEP to include some balance exercises at home and education provided to patient and son regarding how she can complete safely at home next to a stable surface such as a counter or in the corner of a room. Patient encouraged to follow-up as scheduled. She will benefit from PT services to address deficits in strength, balance, and mobility in order to return to full function at home.    Personal Factors and Comorbidities Age;Behavior Pattern    Examination-Activity Limitations Squat;Stand;Stairs;Sit;Transfers    Examination-Participation Restrictions Cleaning;Yard Work;Community Activity    Rehab Potential Good    PT Frequency 2x / week    PT Duration 12 weeks    PT Treatment/Interventions Therapeutic exercise;Manual techniques;Moist  Heat;Ultrasound;Electrical Stimulation;Gait training;Stair training;Functional mobility training;Therapeutic activities;Balance training;Neuromuscular re-education;Patient/family education;Passive range of motion;Energy conservation;Orthotic Fit/Training;Joint Manipulations    PT Next Visit Plan Review HEP with patient, progress balance and strength, consider adding additional ankle strengthening    PT Home Exercise Plan Access Code: [email or web address]    Consulted and Agree with Plan of Care Patient           Patient will benefit from skilled therapeutic intervention in order to improve the following deficits and impairments:  Abnormal gait, Decreased balance, Decreased range of motion, Improper body mechanics, Decreased activity tolerance, Decreased knowledge of use of DME, Decreased strength, Postural dysfunction  Visit Diagnosis: Unsteadiness on feet  Other abnormalities of gait and mobility     Problem List Patient Active Problem List   Diagnosis Date Noted   Diaphoresis 03/13/2020   Change in hearing 01/17/2020   Balance problem 01/17/2020   Change in vision 11/26/2019   Hypercholesterolemia 07/01/2019   Elevated TSH 07/02/2018   Right hip pain 07/02/2018   Sweating abnormality 12/28/2017   Aortic atherosclerosis ([place]) 10/29/2016   B12 deficiency 10/29/2015   Anemia 08/28/2015   Cough 06/28/2015   Paroxysmal atrial fibrillation ([place]) 06/08/2015   Left hip pain 04/05/2015   Osteoporosis, post-menopausal 10/10/2014   Pain of left thumb 08/06/2014   Health care maintenance 08/06/2014   Osteoporosis 07/28/2014   Bronchiectasis without acute exacerbation ([place]) 01/20/2014   Multiple pulmonary nodules 01/20/2014   Abnormal CXR 11/02/2013   Chest pain 11/01/2013   Fatigue 11/01/2013   Burning sensation in lower extremity 11/01/2013   Environmental allergies 04/19/2013   Arthritis 01/18/2013   Depression 01/18/2013   GERD (gastroesophageal reflux  disease) 01/18/2013   Glaucoma 01/18/2013   History of  colonic polyps 01/18/2013   Carotid stenosis 01/18/2013     Phillips Grout PT, DPT, GCS  Kollins Fenter 03/14/2020, 2:19 PM  [place] MAIN [facility] SERVICES [address] [place], [place], [address] Phone: [phone]   Fax:  [phone]  Name: MODELLE VOLLMER MRN: [id number] Date of Birth: 06-20-1930

## 2020-03-15 ENCOUNTER — Telehealth: Payer: Self-pay | Admitting: Nurse Practitioner

## 2020-03-15 NOTE — Telephone Encounter (Signed)
Patient advised of below and agrees to schedule an appt with Dr. Clayborn Bigness.

## 2020-03-15 NOTE — Telephone Encounter (Signed)
Please call the patient and advise that she make an appt with Dr. Clayborn Bigness to follow up on her recent EKG changes with Afib and  her neck sweating on exertion symptom. She was in sinus rhythm in July.

## 2020-03-16 ENCOUNTER — Ambulatory Visit: Payer: Medicare Other

## 2020-03-16 ENCOUNTER — Other Ambulatory Visit: Payer: Self-pay

## 2020-03-16 VITALS — BP 137/57 | HR 94

## 2020-03-16 DIAGNOSIS — R2689 Other abnormalities of gait and mobility: Secondary | ICD-10-CM

## 2020-03-16 DIAGNOSIS — R2681 Unsteadiness on feet: Secondary | ICD-10-CM

## 2020-03-16 NOTE — Therapy (Signed)
[place] MAIN [facility] SERVICES [address] [place], [place], [address] Phone: [phone]   Fax:  [phone]  Physical Therapy Treatment  Patient Details  Name: Bridget Briggs MRN: [id number] Date of Birth: 1931/04/24 Referring Provider (PT): Jennings Books (neurology)    Encounter Date: 03/16/2020   PT End of Session - 03/16/20 1429    Visit Number 6    Number of Visits 17    Date for PT Re-Evaluation 05/16/20    Authorization Type BCBS Medicare    Authorization Time Period 02/22/20-05/16/20    PT Start Time 1430    PT Stop Time 1515    PT Time Calculation (min) 45 min    Equipment Utilized During Treatment Gait belt    Activity Tolerance Patient tolerated treatment well    Behavior During Therapy [facility] for tasks assessed/performed           Past Medical History:  Diagnosis Date  . Arthritis   . Depression   . GERD (gastroesophageal reflux disease)   . Glaucoma   . History of chicken pox   . History of colon polyps   . Hx: UTI (urinary tract infection)     Past Surgical History:  Procedure Laterality Date  . ABDOMINAL HYSTERECTOMY  1974   left ovary removed  . ANKLE SURGERY Left 1998  . APPENDECTOMY  1974  . BREAST BIOPSY Right 1988   NEG  . BREAST SURGERY Right 1988   biopsy  . CARDIAC CATHETERIZATION N/A 09/06/2015   Procedure: Right/Left Heart Cath and Coronary Angiography;  Surgeon: Yolonda Kida, MD;  Location: [facility];  Service: Cardiovascular;  Laterality: N/A;  . CHOLECYSTECTOMY  1996  . EYE SURGERY Bilateral 2005/2006   cataract  . LAPAROSCOPIC GASTRIC BANDING WITH HIATAL HERNIA REPAIR  2000  . NISSEN FUNDOPLICATION  6759  . OOPHORECTOMY Left   . TONSILLECTOMY AND ADENOIDECTOMY  1940    Vitals:   03/16/20 1434  BP: (!) 137/57  Pulse: 94  SpO2: 97%     Subjective Assessment - 03/16/20 1429    Subjective Pt reports she is doing well today.  Denies pain upon arrival.  No falls or loss of  balance since last therapy session. She states that she has done some of her exercises at home but not all of them. No specific questions or concerns upon arrival.    Patient is accompained by: Family member    Pertinent History Pt presents to OP PT for evaluation of unsteadiness on feet. Pt referred by Neurologist who sees her for Lewy Body Dementian diagnosis, new as of a few months ago. Son present during evaluation to provide history, reports 1 close-call wherein he provided physical assistance to  prevent fall. Son reports pt AMB with a shufle and appears to catch her toes on sudden chances in surface height.    How long can you sit comfortably? N/A    How long can you stand comfortably? N/A    How long can you walk comfortably? ~ 1/2 mile    Diagnostic tests being followed for new dx of lewy body dementia    Currently in Pain? No/denies             TREATMENT   Ther-ex NuStep L2-4 x 5 minutes for warm-up during history, therapist adjusting resistance throughout and monitoring for fatigue (3 minutes unbilled); Precor BLE leg press70# x 20, 85# x 20, 90# x 10 (attempted 100# however pt unable to perform);  Heel/toe rocks with 3s hold and BUE support x 20 each direction; Seated LAQ with 4# ankle weights x 20 BLE;  Standing exercises with 4# ankle weights: Hip flexion marching x 20 BLE; Hip abduction x 20 BLE; Hip extension x 20 BLE; HS curls x 20 BLE;  Sit to stand without UE support and Airex pad under feet x10, pt requires minA+1 intermittently due to LOB posterior. Cues for anterior weight shifting due to posterior LOB;   Neuromuscular Re-education All balance exercises performed without UE support and CGA provided by therapist throughout unless otherwise noted: 8" (6" step with 2" Airex pad on top) alternating step-ups without UE support x 10 leading with each leg; Airex 6" alternating step taps without UE support x 10 BLE; Airex alternating cone taps on top of 6"  step without knocking over cone x 5 BLE;   Pt educated throughout session about proper posture and technique with exercises. Improved exercise technique, movement at target joints, use of target muscles after min to mod verbal, visual, tactile cues.    Patient demonstrates excellent motivation during session.Progressed resistance onleg press again today and pt is able to perform with minimal L knee pain. Also progressed ankle weights today.Continued with bilateral lower extremity strengthening in both sitting and standing positions and alsocontinued with standing balance exercises including unstable surfaces such as Airex pad. She is demonstrating improved single leg stability on Airex pad during session. Advised son of phone message from PCP in chart that pt needs to schedule a follow-up with cardiology.Patient encouraged to follow-up as scheduled and continue her HEP. She will benefit from PT services to address deficits in strength, balance, and mobility in order to return to full function at home.                           PT Short Term Goals - 02/22/20 1515      PT SHORT TERM GOAL #1   Title After 4 weeks pt to demonstrate 10MWT >0.29m/s    Baseline 0.48m/s at eval;    Time 4    Period Weeks    Status New    Target Date 03/21/20      PT SHORT TERM GOAL #2   Title After 4 weeks patient to demonstrate improved SLS balance to >5 sec bilat and supervision level assistance.    Baseline <3 seconds at minGuard assist at eval    Time 4    Period Weeks    Status New    Target Date 03/21/20             PT Long Term Goals - 02/22/20 1518      PT LONG TERM GOAL #1   Title After 8 weeks pt to demonstrate FOTO score >71 to show improved functional independence.    Baseline 64 at eval    Time 8    Period Weeks    Status New    Target Date 04/18/20      PT LONG TERM GOAL #2   Title After 8 weeks pt to demonstrate improved DGI to >21 to show decreased  falls risk.    Baseline At eval: 18/24.    Time 8    Period Weeks    Status New    Target Date 04/18/20      PT LONG TERM GOAL #3   Title After 12 weeks pt to demonstrate improved 6MWT to 1257ft (age-matched norm value for females 80-89yo)  Time 12    Period Weeks    Status New    Target Date 05/16/20      PT LONG TERM GOAL #4   Title After 12 weeks pt to demonstrate 10MWT>1.57m/s to show reduced falls risk from a gait standpoint.    Baseline 0.58m/s @ eval    Time 12    Period Weeks    Status New    Target Date 05/16/20                 Plan - 03/16/20 1429    Clinical Impression Statement Patient demonstrates excellent motivation during session. Progressed resistance on leg press again today and pt is able to perform with minimal L knee pain. Also progressed ankle weights today. Continued with bilateral lower extremity strengthening in both sitting and standing positions and also continued with standing balance exercises including unstable surfaces such as Airex pad. She is demonstrating improved single leg stability on Airex pad during session.  Advised son of phone message from PCP in chart that pt needs to schedule a follow-up with cardiology. Patient encouraged to follow-up as scheduled and continue her HEP. She will benefit from PT services to address deficits in strength, balance, and mobility in order to return to full function at home    Personal Factors and Comorbidities Age;Behavior Pattern    Examination-Activity Limitations Squat;Stand;Stairs;Sit;Transfers    Examination-Participation Restrictions Cleaning;Yard Work;Community Activity    Rehab Potential Good    PT Frequency 2x / week    PT Duration 12 weeks    PT Treatment/Interventions Therapeutic exercise;Manual techniques;Moist Heat;Ultrasound;Electrical Stimulation;Gait training;Stair training;Functional mobility training;Therapeutic activities;Balance training;Neuromuscular re-education;Patient/family  education;Passive range of motion;Energy conservation;Orthotic Fit/Training;Joint Manipulations    PT Next Visit Plan Review HEP with patient, progress balance and strength, consider adding additional ankle strengthening    PT Home Exercise Plan Access Code: [email or web address]    Consulted and Agree with Plan of Care Patient           Patient will benefit from skilled therapeutic intervention in order to improve the following deficits and impairments:  Abnormal gait, Decreased balance, Decreased range of motion, Improper body mechanics, Decreased activity tolerance, Decreased knowledge of use of DME, Decreased strength, Postural dysfunction  Visit Diagnosis: Unsteadiness on feet  Other abnormalities of gait and mobility     Problem List Patient Active Problem List   Diagnosis Date Noted  . Diaphoresis 03/13/2020  . Change in hearing 01/17/2020  . Balance problem 01/17/2020  . Change in vision 11/26/2019  . Hypercholesterolemia 07/01/2019  . Elevated TSH 07/02/2018  . Right hip pain 07/02/2018  . Sweating abnormality 12/28/2017  . Aortic atherosclerosis ([place]) 10/29/2016  . B12 deficiency 10/29/2015  . Anemia 08/28/2015  . Cough 06/28/2015  . Paroxysmal atrial fibrillation ([place]) 06/08/2015  . Left hip pain 04/05/2015  . Osteoporosis, post-menopausal 10/10/2014  . Pain of left thumb 08/06/2014  . Health care maintenance 08/06/2014  . Osteoporosis 07/28/2014  . Bronchiectasis without acute exacerbation ([place]) 01/20/2014  . Multiple pulmonary nodules 01/20/2014  . Abnormal CXR 11/02/2013  . Chest pain 11/01/2013  . Fatigue 11/01/2013  . Burning sensation in lower extremity 11/01/2013  . Environmental allergies 04/19/2013  . Arthritis 01/18/2013  . Depression 01/18/2013  . GERD (gastroesophageal reflux disease) 01/18/2013  . Glaucoma 01/18/2013  . History of colonic polyps 01/18/2013  . Carotid stenosis 01/18/2013   Lyndel Safe Enzio Buchler PT, DPT, GCS  Mathius Birkeland 03/16/2020, 4:34  PM  [place] MAIN REHAB SERVICES  [place], [place], [address] Phone: [phone]   Fax:  [phone]  Name: Bridget Briggs MRN: [id number] Date of Birth: [birth year]

## 2020-03-21 ENCOUNTER — Ambulatory Visit: Payer: Medicare Other | Attending: Neurology

## 2020-03-21 ENCOUNTER — Other Ambulatory Visit: Payer: Self-pay

## 2020-03-21 DIAGNOSIS — R2681 Unsteadiness on feet: Secondary | ICD-10-CM | POA: Diagnosis not present

## 2020-03-21 DIAGNOSIS — R2689 Other abnormalities of gait and mobility: Secondary | ICD-10-CM

## 2020-03-21 NOTE — Therapy (Signed)
[place] MAIN [facility] SERVICES [address] [place], [place], [address] Phone: [phone]   Fax:  [phone]  Physical Therapy Treatment  Patient Details  Name: Bridget Briggs MRN: [id number] Date of Birth: Nov 03, 1930 Referring Provider (PT): Jennings Books (neurology)    Encounter Date: 03/21/2020   PT End of Session - 03/21/20 1306    Visit Number 7    Number of Visits 17    Date for PT Re-Evaluation 05/16/20    Authorization Type BCBS Medicare    Authorization Time Period 02/22/20-05/16/20    PT Start Time 1301    PT Stop Time 1345    PT Time Calculation (min) 44 min    Equipment Utilized During Treatment Gait belt    Activity Tolerance Patient tolerated treatment well    Behavior During Therapy [facility] for tasks assessed/performed           Past Medical History:  Diagnosis Date  . Arthritis   . Depression   . GERD (gastroesophageal reflux disease)   . Glaucoma   . History of chicken pox   . History of colon polyps   . Hx: UTI (urinary tract infection)     Past Surgical History:  Procedure Laterality Date  . ABDOMINAL HYSTERECTOMY  1974   left ovary removed  . ANKLE SURGERY Left 1998  . APPENDECTOMY  1974  . BREAST BIOPSY Right 1988   NEG  . BREAST SURGERY Right 1988   biopsy  . CARDIAC CATHETERIZATION N/A 09/06/2015   Procedure: Right/Left Heart Cath and Coronary Angiography;  Surgeon: Yolonda Kida, MD;  Location: [facility];  Service: Cardiovascular;  Laterality: N/A;  . CHOLECYSTECTOMY  1996  . EYE SURGERY Bilateral 2005/2006   cataract  . LAPAROSCOPIC GASTRIC BANDING WITH HIATAL HERNIA REPAIR  2000  . NISSEN FUNDOPLICATION  7253  . OOPHORECTOMY Left   . TONSILLECTOMY AND ADENOIDECTOMY  1940    There were no vitals filed for this visit.   Subjective Assessment - 03/21/20 1304    Subjective Pt reports she is doing well today.  Denies pain upon arrival.  No falls or loss of balance since last therapy  session. She states that she has done her exercises at home "a little bit." No specific questions or concerns upon arrival.    Patient is accompained by: Family member    Pertinent History Pt presents to OP PT for evaluation of unsteadiness on feet. Pt referred by Neurologist who sees her for Lewy Body Dementian diagnosis, new as of a few months ago. Son present during evaluation to provide history, reports 1 close-call wherein he provided physical assistance to  prevent fall. Son reports pt AMB with a shufle and appears to catch her toes on sudden chances in surface height.    How long can you sit comfortably? N/A    How long can you stand comfortably? N/A    How long can you walk comfortably? ~ 1/2 mile    Diagnostic tests being followed for new dx of lewy body dementia    Currently in Pain? No/denies                TREATMENT   Ther-ex NuStep L2-5 x 5 minutes for warm-up during history, therapist adjusting resistance throughout and monitoring for fatigue (58minutes unbilled); Precor BLE leg press 85# x 20, 100# x 20; Sit to stand without UE support andAirex pad under feet x10, x 7 pt requires minA+1 intermittently due to LOB  posterior. Cues for anterior weight shifting due to posterior LOB; Forward BOSU lunges without UE support alternating forward LE x 10 each;   Neuromuscular Re-education All balance exercises performed without UE support and CGA provided by therapist throughout unless otherwise noted: 6" orange hurdle forward step-overs x 10 each; Rockerboard A/P static balance eyes open/closed x 30s each; Rockerboard A/P heel/toe rocks x 30s; Rockerboard A/Phorizontal and vertical head turns x 30 seconds each; Rockerboard R/L static balance eyes open/closed x 30s each; Rockerboard R/L lateral weight shifts x 30s; Rockerboard R/Lhorizontal and vertical head turns x 30 seconds each;   Pt educated throughout session about proper posture and technique with exercises.  Improved exercise technique, movement at target joints, use of target muscles after min to mod verbal, visual, tactile cues.    Patient demonstrates excellent motivation during session.Progressed resistance onleg press again today and pt is able to perform with no complaints of L knee pain.Continued with bilateral lower extremity strengthening and utilized BOSU today to incorporate stability challenge. Patient encouraged to follow-up as scheduled and continue her HEP. She will benefit from PT services to address deficits in strength, balance, and mobility in order to return to full function at home.                        PT Short Term Goals - 02/22/20 1515      PT SHORT TERM GOAL #1   Title After 4 weeks pt to demonstrate 10MWT >0.60m/s    Baseline 0.37m/s at eval;    Time 4    Period Weeks    Status New    Target Date 03/21/20      PT SHORT TERM GOAL #2   Title After 4 weeks patient to demonstrate improved SLS balance to >5 sec bilat and supervision level assistance.    Baseline <3 seconds at minGuard assist at eval    Time 4    Period Weeks    Status New    Target Date 03/21/20             PT Long Term Goals - 02/22/20 1518      PT LONG TERM GOAL #1   Title After 8 weeks pt to demonstrate FOTO score >71 to show improved functional independence.    Baseline 64 at eval    Time 8    Period Weeks    Status New    Target Date 04/18/20      PT LONG TERM GOAL #2   Title After 8 weeks pt to demonstrate improved DGI to >21 to show decreased falls risk.    Baseline At eval: 18/24.    Time 8    Period Weeks    Status New    Target Date 04/18/20      PT LONG TERM GOAL #3   Title After 12 weeks pt to demonstrate improved 6MWT to 1222ft (age-matched norm value for females 80-89yo)    Time 12    Period Weeks    Status New    Target Date 05/16/20      PT LONG TERM GOAL #4   Title After 12 weeks pt to demonstrate 10MWT>1.35m/s to show reduced falls  risk from a gait standpoint.    Baseline 0.31m/s @ eval    Time 12    Period Weeks    Status New    Target Date 05/16/20  Plan - 03/21/20 1306    Clinical Impression Statement Patient demonstrates excellent motivation during session. Progressed resistance on leg press again today and pt is able to perform with no complaints of L knee pain. Continued with bilateral lower extremity strengthening and utilized BOSU today to incorporate stability challenge. Patient encouraged to follow-up as scheduled and continue her HEP. She will benefit from PT services to address deficits in strength, balance, and mobility in order to return to full function at home.    Personal Factors and Comorbidities Age;Behavior Pattern    Examination-Activity Limitations Squat;Stand;Stairs;Sit;Transfers    Examination-Participation Restrictions Cleaning;Yard Work;Community Activity    Rehab Potential Good    PT Frequency 2x / week    PT Duration 12 weeks    PT Treatment/Interventions Therapeutic exercise;Manual techniques;Moist Heat;Ultrasound;Electrical Stimulation;Gait training;Stair training;Functional mobility training;Therapeutic activities;Balance training;Neuromuscular re-education;Patient/family education;Passive range of motion;Energy conservation;Orthotic Fit/Training;Joint Manipulations    PT Next Visit Plan Review HEP with patient, progress balance and strength, consider adding additional ankle strengthening    PT Home Exercise Plan Access Code: [email or web address]    Consulted and Agree with Plan of Care Patient           Patient will benefit from skilled therapeutic intervention in order to improve the following deficits and impairments:  Abnormal gait, Decreased balance, Decreased range of motion, Improper body mechanics, Decreased activity tolerance, Decreased knowledge of use of DME, Decreased strength, Postural dysfunction  Visit Diagnosis: Unsteadiness on feet  Other abnormalities of  gait and mobility     Problem List Patient Active Problem List   Diagnosis Date Noted  . Diaphoresis 03/13/2020  . Change in hearing 01/17/2020  . Balance problem 01/17/2020  . Change in vision 11/26/2019  . Hypercholesterolemia 07/01/2019  . Elevated TSH 07/02/2018  . Right hip pain 07/02/2018  . Sweating abnormality 12/28/2017  . Aortic atherosclerosis ([place]) 10/29/2016  . B12 deficiency 10/29/2015  . Anemia 08/28/2015  . Cough 06/28/2015  . Paroxysmal atrial fibrillation ([place]) 06/08/2015  . Left hip pain 04/05/2015  . Osteoporosis, post-menopausal 10/10/2014  . Pain of left thumb 08/06/2014  . Health care maintenance 08/06/2014  . Osteoporosis 07/28/2014  . Bronchiectasis without acute exacerbation ([place]) 01/20/2014  . Multiple pulmonary nodules 01/20/2014  . Abnormal CXR 11/02/2013  . Chest pain 11/01/2013  . Fatigue 11/01/2013  . Burning sensation in lower extremity 11/01/2013  . Environmental allergies 04/19/2013  . Arthritis 01/18/2013  . Depression 01/18/2013  . GERD (gastroesophageal reflux disease) 01/18/2013  . Glaucoma 01/18/2013  . History of colonic polyps 01/18/2013  . Carotid stenosis 01/18/2013   Phillips Grout PT, DPT, GCS  Bridget Briggs 03/21/2020, 2:32 PM  [place] MAIN [facility] SERVICES [address] [place], [place], [address] Phone: [phone]   Fax:  [phone]  Name: Bridget Briggs MRN: [id number] Date of Birth: [birth year]

## 2020-03-23 ENCOUNTER — Ambulatory Visit: Payer: Medicare Other

## 2020-03-23 ENCOUNTER — Other Ambulatory Visit: Payer: Self-pay

## 2020-03-23 DIAGNOSIS — R2681 Unsteadiness on feet: Secondary | ICD-10-CM

## 2020-03-23 DIAGNOSIS — R2689 Other abnormalities of gait and mobility: Secondary | ICD-10-CM

## 2020-03-23 NOTE — Therapy (Signed)
[place] MAIN [facility] SERVICES [address] [place], [place], [address] Phone: [phone]   Fax:  [phone]  Physical Therapy Treatment  Patient Details  Name: Bridget Briggs MRN: [id number] Date of Birth: [birth year] Referring Provider (PT): Jennings Books (neurology)    Encounter Date: 03/23/2020   PT End of Session - 03/23/20 1437    Visit Number 8    Number of Visits 17    Date for PT Re-Evaluation 05/16/20    Authorization Type BCBS Medicare    Authorization Time Period 02/22/20-05/16/20    PT Start Time 1430    PT Stop Time 1515    PT Time Calculation (min) 45 min    Equipment Utilized During Treatment Gait belt    Activity Tolerance Patient tolerated treatment well    Behavior During Therapy [facility] for tasks assessed/performed           Past Medical History:  Diagnosis Date  . Arthritis   . Depression   . GERD (gastroesophageal reflux disease)   . Glaucoma   . History of chicken pox   . History of colon polyps   . Hx: UTI (urinary tract infection)     Past Surgical History:  Procedure Laterality Date  . ABDOMINAL HYSTERECTOMY  1974   left ovary removed  . ANKLE SURGERY Left 1998  . APPENDECTOMY  1974  . BREAST BIOPSY Right 1988   NEG  . BREAST SURGERY Right 1988   biopsy  . CARDIAC CATHETERIZATION N/A 09/06/2015   Procedure: Right/Left Heart Cath and Coronary Angiography;  Surgeon: Yolonda Kida, MD;  Location: [facility];  Service: Cardiovascular;  Laterality: N/A;  . CHOLECYSTECTOMY  1996  . EYE SURGERY Bilateral 2005/2006   cataract  . LAPAROSCOPIC GASTRIC BANDING WITH HIATAL HERNIA REPAIR  2000  . NISSEN FUNDOPLICATION  0109  . OOPHORECTOMY Left   . TONSILLECTOMY AND ADENOIDECTOMY  1940    There were no vitals filed for this visit.   Subjective Assessment - 03/23/20 1436    Subjective Pt reports she is doing well today.  Denies pain upon arrival.  No falls or loss of balance since last therapy  session. Some consistency with HEP per patient. No specific questions or concerns.    Patient is accompained by: Family member    Pertinent History Pt presents to OP PT for evaluation of unsteadiness on feet. Pt referred by Neurologist who sees her for Lewy Body Dementian diagnosis, new as of a few months ago. Son present during evaluation to provide history, reports 1 close-call wherein he provided physical assistance to  prevent fall. Son reports pt AMB with a shufle and appears to catch her toes on sudden chances in surface height.    How long can you sit comfortably? N/A    How long can you stand comfortably? N/A    How long can you walk comfortably? ~ 1/2 mile    Diagnostic tests being followed for new dx of lewy body dementia    Currently in Pain? No/denies               TREATMENT   Ther-ex NuStep L2-5 x 5 minutes for warm-up during history, therapist adjusting resistance throughout and monitoring for fatigue (80minutes unbilled); Sit to stand with 3kg overhead ball press 2 x 10, CGA only; Precor BLE leg press 100# x 20, 105# x 10; Matrix resisted gait 7.5# forward, backward, R lateral, and L lateral x 2 each direction;  Neuromuscular Re-education All balance exercises performed without UE support and CGA provided by therapist throughout unless otherwise noted: 1/2 foam roll A/P orientation static balance x 30s; 1/2 foam roll A/P orientation hee/toe rocking x 30s each; 1/2 foam roll tandem balance alternating forward LE 2 x 30s each; Airex balance beam tandem gait x 4 lengths; Airex balance beam side steps x 4 lengths; Airex NBOS basketball toss to challenge dynamic balance x multiple bouts;   Pt educated throughout session about proper posture and technique with exercises. Improved exercise technique, movement at target joints, use of target muscles after min to mod verbal, visual, tactile cues.    Patient demonstrates excellent motivation during  session.Progressed resistance onleg press again today and pt is able to perform however she is somewhat limited today due to increase in left knee pain. Continued with bilateral lower extremity strengthening and introduced resisted gait during session today. Patient encouraged to follow-up as scheduled and continue her HEP. She will benefit from PT services to address deficits in strength, balance, and mobility in order to return to full function at home.                                  PT Short Term Goals - 02/22/20 1515      PT SHORT TERM GOAL #1   Title After 4 weeks pt to demonstrate 10MWT >0.25m/s    Baseline 0.74m/s at eval;    Time 4    Period Weeks    Status New    Target Date 03/21/20      PT SHORT TERM GOAL #2   Title After 4 weeks patient to demonstrate improved SLS balance to >5 sec bilat and supervision level assistance.    Baseline <3 seconds at minGuard assist at eval    Time 4    Period Weeks    Status New    Target Date 03/21/20             PT Long Term Goals - 02/22/20 1518      PT LONG TERM GOAL #1   Title After 8 weeks pt to demonstrate FOTO score >71 to show improved functional independence.    Baseline 64 at eval    Time 8    Period Weeks    Status New    Target Date 04/18/20      PT LONG TERM GOAL #2   Title After 8 weeks pt to demonstrate improved DGI to >21 to show decreased falls risk.    Baseline At eval: 18/24.    Time 8    Period Weeks    Status New    Target Date 04/18/20      PT LONG TERM GOAL #3   Title After 12 weeks pt to demonstrate improved 6MWT to 1251ft (age-matched norm value for females 80-89yo)    Time 12    Period Weeks    Status New    Target Date 05/16/20      PT LONG TERM GOAL #4   Title After 12 weeks pt to demonstrate 10MWT>1.2m/s to show reduced falls risk from a gait standpoint.    Baseline 0.69m/s @ eval    Time 12    Period Weeks    Status New    Target Date 05/16/20                  Plan - 03/23/20 1437  Clinical Impression Statement Patient demonstrates excellent motivation during session. Progressed resistance on leg press again today and pt is able to perform however she is somewhat limited today due to increase in left knee pain. Continued with bilateral lower extremity strengthening and introduced resisted gait during session today. Patient encouraged to follow-up as scheduled and continue her HEP. She will benefit from PT services to address deficits in strength, balance, and mobility in order to return to full function at home.    Personal Factors and Comorbidities Age;Behavior Pattern    Examination-Activity Limitations Squat;Stand;Stairs;Sit;Transfers    Examination-Participation Restrictions Cleaning;Yard Work;Community Activity    Rehab Potential Good    PT Frequency 2x / week    PT Duration 12 weeks    PT Treatment/Interventions Therapeutic exercise;Manual techniques;Moist Heat;Ultrasound;Electrical Stimulation;Gait training;Stair training;Functional mobility training;Therapeutic activities;Balance training;Neuromuscular re-education;Patient/family education;Passive range of motion;Energy conservation;Orthotic Fit/Training;Joint Manipulations    PT Next Visit Plan Review HEP with patient, progress balance and strength, consider adding additional ankle strengthening    PT Home Exercise Plan Access Code: [email or web address]    Consulted and Agree with Plan of Care Patient           Patient will benefit from skilled therapeutic intervention in order to improve the following deficits and impairments:  Abnormal gait, Decreased balance, Decreased range of motion, Improper body mechanics, Decreased activity tolerance, Decreased knowledge of use of DME, Decreased strength, Postural dysfunction  Visit Diagnosis: Unsteadiness on feet  Other abnormalities of gait and mobility     Problem List Patient Active Problem List   Diagnosis Date Noted  .  Diaphoresis 03/13/2020  . Change in hearing 01/17/2020  . Balance problem 01/17/2020  . Change in vision 11/26/2019  . Hypercholesterolemia 07/01/2019  . Elevated TSH 07/02/2018  . Right hip pain 07/02/2018  . Sweating abnormality 12/28/2017  . Aortic atherosclerosis ([place]) 10/29/2016  . B12 deficiency 10/29/2015  . Anemia 08/28/2015  . Cough 06/28/2015  . Paroxysmal atrial fibrillation ([place]) 06/08/2015  . Left hip pain 04/05/2015  . Osteoporosis, post-menopausal 10/10/2014  . Pain of left thumb 08/06/2014  . Health care maintenance 08/06/2014  . Osteoporosis 07/28/2014  . Bronchiectasis without acute exacerbation ([place]) 01/20/2014  . Multiple pulmonary nodules 01/20/2014  . Abnormal CXR 11/02/2013  . Chest pain 11/01/2013  . Fatigue 11/01/2013  . Burning sensation in lower extremity 11/01/2013  . Environmental allergies 04/19/2013  . Arthritis 01/18/2013  . Depression 01/18/2013  . GERD (gastroesophageal reflux disease) 01/18/2013  . Glaucoma 01/18/2013  . History of colonic polyps 01/18/2013  . Carotid stenosis 01/18/2013   Phillips Grout PT, DPT, GCS  Shaniece Bussa 03/24/2020, 9:11 AM  [place] MAIN [facility] SERVICES [address] [place], [place], [address] Phone: [phone]   Fax:  [phone]  Name: Bridget Briggs MRN: [id number] Date of Birth: 06-Oct-1930

## 2020-03-28 ENCOUNTER — Other Ambulatory Visit: Payer: Self-pay

## 2020-03-28 ENCOUNTER — Ambulatory Visit: Payer: Medicare Other

## 2020-03-28 DIAGNOSIS — R2681 Unsteadiness on feet: Secondary | ICD-10-CM | POA: Diagnosis not present

## 2020-03-28 DIAGNOSIS — R2689 Other abnormalities of gait and mobility: Secondary | ICD-10-CM

## 2020-03-28 NOTE — Therapy (Signed)
[place] MAIN [facility] SERVICES [address] [place], [place], [address] Phone: [phone]   Fax:  [phone]  Physical Therapy Treatment  Patient Details  Name: Bridget Briggs MRN: [id number] Date of Birth: 01-Jun-1930 Referring Provider (PT): Jennings Books (neurology)    Encounter Date: 03/28/2020   PT End of Session - 03/28/20 1329    Visit Number 9    Number of Visits 17    Date for PT Re-Evaluation 05/16/20    Authorization Type BCBS Medicare    Authorization Time Period 02/22/20-05/16/20    PT Start Time 1330    PT Stop Time 1415    PT Time Calculation (min) 45 min    Equipment Utilized During Treatment Gait belt    Activity Tolerance Patient tolerated treatment well    Behavior During Therapy [facility] for tasks assessed/performed           Past Medical History:  Diagnosis Date  . Arthritis   . Depression   . GERD (gastroesophageal reflux disease)   . Glaucoma   . History of chicken pox   . History of colon polyps   . Hx: UTI (urinary tract infection)     Past Surgical History:  Procedure Laterality Date  . ABDOMINAL HYSTERECTOMY  1974   left ovary removed  . ANKLE SURGERY Left 1998  . APPENDECTOMY  1974  . BREAST BIOPSY Right 1988   NEG  . BREAST SURGERY Right 1988   biopsy  . CARDIAC CATHETERIZATION N/A 09/06/2015   Procedure: Right/Left Heart Cath and Coronary Angiography;  Surgeon: Yolonda Kida, MD;  Location: [place] [facility];  Service: Cardiovascular;  Laterality: N/A;  . CHOLECYSTECTOMY  1996  . EYE SURGERY Bilateral 2005/2006   cataract  . LAPAROSCOPIC GASTRIC BANDING WITH HIATAL HERNIA REPAIR  2000  . NISSEN FUNDOPLICATION  0240  . OOPHORECTOMY Left   . TONSILLECTOMY AND ADENOIDECTOMY  1940    There were no vitals filed for this visit.   Subjective Assessment - 03/28/20 1329    Subjective Pt reports she is doing well today.  Denies pain upon arrival.  No falls or loss of balance since last therapy  session. No specific questions or concerns.    Patient is accompained by: Family member    Pertinent History Pt presents to OP PT for evaluation of unsteadiness on feet. Pt referred by Neurologist who sees her for Lewy Body Dementian diagnosis, new as of a few months ago. Son present during evaluation to provide history, reports 1 close-call wherein he provided physical assistance to  prevent fall. Son reports pt AMB with a shufle and appears to catch her toes on sudden chances in surface height.    How long can you sit comfortably? N/A    How long can you stand comfortably? N/A    How long can you walk comfortably? ~ 1/2 mile    Diagnostic tests being followed for new dx of lewy body dementia    Currently in Pain? No/denies              TREATMENT   Ther-ex NuStep L2-4 x 5 minutes for warm-up during history, therapist adjusting resistance throughout and monitoring for fatigue (11minutes unbilled); Sit to stand with 5kg overhead ball press 2 x 10, CGA only; Precor BLE leg press 100# 2 x 20, weight not progressed due to complains of increase in L knee pain; Matrix resisted gait 12.5# forward, backward, R lateral, and L lateral x 2 each  direction; Standing heel raises with BUE support x 20;   Neuromuscular Re-education All balance exercises performed without UE support and CGA provided by therapist throughout unless otherwise noted: Step-ups to 5" step with 2" Airex pad on top alternating leading LE x 10 each Rockerboard A/P static balance eyes open/closed x 30s each; Rockerboard A/P heel/toe rocks x 30s; Rockerboard A/Phorizontal and vertical head turns x 30 seconds each; Rockerboard R/L static balance eyes open/closed x 30s each; Rockerboard R/L lateral weight shifts x 30s; Rockerboard R/Lhorizontal and vertical head turns x 30 seconds each;   Pt educated throughout session about proper posture and technique with exercises. Improved exercise technique, movement at target  joints, use of target muscles after min to mod verbal, visual, tactile cues.    Patient demonstrates excellent motivation during session.Unable to progress resistance onleg press today due to increase in L knee pain so kept resistance at 100#. Continued with resisted gait but progressed resistance today. Pt will need updated outcome measures, goals, and progress note at next visit. Patient encouraged to follow-up as scheduled and continue her HEP. She will benefit from PT services to address deficits in strength, balance, and mobility in order to return to full function at home.                             PT Short Term Goals - 02/22/20 1515      PT SHORT TERM GOAL #1   Title After 4 weeks pt to demonstrate 10MWT >0.39m/s    Baseline 0.10m/s at eval;    Time 4    Period Weeks    Status New    Target Date 03/21/20      PT SHORT TERM GOAL #2   Title After 4 weeks patient to demonstrate improved SLS balance to >5 sec bilat and supervision level assistance.    Baseline <3 seconds at minGuard assist at eval    Time 4    Period Weeks    Status New    Target Date 03/21/20             PT Long Term Goals - 02/22/20 1518      PT LONG TERM GOAL #1   Title After 8 weeks pt to demonstrate FOTO score >71 to show improved functional independence.    Baseline 64 at eval    Time 8    Period Weeks    Status New    Target Date 04/18/20      PT LONG TERM GOAL #2   Title After 8 weeks pt to demonstrate improved DGI to >21 to show decreased falls risk.    Baseline At eval: 18/24.    Time 8    Period Weeks    Status New    Target Date 04/18/20      PT LONG TERM GOAL #3   Title After 12 weeks pt to demonstrate improved 6MWT to 1259ft (age-matched norm value for females 80-89yo)    Time 12    Period Weeks    Status New    Target Date 05/16/20      PT LONG TERM GOAL #4   Title After 12 weeks pt to demonstrate 10MWT>1.49m/s to show reduced falls risk from a  gait standpoint.    Baseline 0.80m/s @ eval    Time 12    Period Weeks    Status New    Target Date 05/16/20  Plan - 03/28/20 1330    Personal Factors and Comorbidities Age;Behavior Pattern    Examination-Activity Limitations Squat;Stand;Stairs;Sit;Transfers    Examination-Participation Restrictions Cleaning;Yard Work;Community Activity    Rehab Potential Good    PT Frequency 2x / week    PT Duration 12 weeks    PT Treatment/Interventions Therapeutic exercise;Manual techniques;Moist Heat;Ultrasound;Electrical Stimulation;Gait training;Stair training;Functional mobility training;Therapeutic activities;Balance training;Neuromuscular re-education;Patient/family education;Passive range of motion;Energy conservation;Orthotic Fit/Training;Joint Manipulations    PT Next Visit Plan Review HEP with patient, progress balance and strength, consider adding additional ankle strengthening    PT Home Exercise Plan Access Code: [email or web address]    Consulted and Agree with Plan of Care Patient           Patient will benefit from skilled therapeutic intervention in order to improve the following deficits and impairments:  Abnormal gait, Decreased balance, Decreased range of motion, Improper body mechanics, Decreased activity tolerance, Decreased knowledge of use of DME, Decreased strength, Postural dysfunction  Visit Diagnosis: Unsteadiness on feet  Other abnormalities of gait and mobility     Problem List Patient Active Problem List   Diagnosis Date Noted  . Diaphoresis 03/13/2020  . Change in hearing 01/17/2020  . Balance problem 01/17/2020  . Change in vision 11/26/2019  . Hypercholesterolemia 07/01/2019  . Elevated TSH 07/02/2018  . Right hip pain 07/02/2018  . Sweating abnormality 12/28/2017  . Aortic atherosclerosis ([place]) 10/29/2016  . B12 deficiency 10/29/2015  . Anemia 08/28/2015  . Cough 06/28/2015  . Paroxysmal atrial fibrillation ([place]) 06/08/2015  . Left hip  pain 04/05/2015  . Osteoporosis, post-menopausal 10/10/2014  . Pain of left thumb 08/06/2014  . Health care maintenance 08/06/2014  . Osteoporosis 07/28/2014  . Bronchiectasis without acute exacerbation ([place]) 01/20/2014  . Multiple pulmonary nodules 01/20/2014  . Abnormal CXR 11/02/2013  . Chest pain 11/01/2013  . Fatigue 11/01/2013  . Burning sensation in lower extremity 11/01/2013  . Environmental allergies 04/19/2013  . Arthritis 01/18/2013  . Depression 01/18/2013  . GERD (gastroesophageal reflux disease) 01/18/2013  . Glaucoma 01/18/2013  . History of colonic polyps 01/18/2013  . Carotid stenosis 01/18/2013   Phillips Grout PT, DPT, GCS  Leslee Haueter 03/28/2020, 2:42 PM  [place] MAIN [facility] SERVICES [address] [place], [place], [address] Phone: [phone]   Fax:  [phone]  Name: Bridget Briggs MRN: [id number] Date of Birth: 03-23-1931

## 2020-03-30 ENCOUNTER — Ambulatory Visit: Payer: Medicare Other

## 2020-03-30 ENCOUNTER — Other Ambulatory Visit: Payer: Self-pay

## 2020-03-30 DIAGNOSIS — R2681 Unsteadiness on feet: Secondary | ICD-10-CM

## 2020-03-30 DIAGNOSIS — R2689 Other abnormalities of gait and mobility: Secondary | ICD-10-CM

## 2020-03-30 NOTE — Therapy (Signed)
[place] MAIN [facility] SERVICES [address] [place], [place], [address] Phone: [phone]   Fax:  [phone]  Physical Therapy Progress Note   Dates of reporting period  02/22/20   to   03/30/20  Patient Details  Name: Bridget Briggs MRN: [id number] Date of Birth: 84-Nov-1932 Referring Provider (PT): Jennings Books (neurology)    Encounter Date: 84/03/2020   PT End of Session - 03/30/20 1340    Visit Number 10    Number of Visits 25    Date for PT Re-Evaluation 05/16/20    Authorization Type BCBS Medicare    Authorization Time Period Initial evaluation: 02/22/20, PN: 03/30/20    PT Start Time 1345    PT Stop Time 1430    PT Time Calculation (min) 45 min    Equipment Utilized During Treatment Gait belt    Activity Tolerance Patient tolerated treatment well    Behavior During Therapy WFL for tasks assessed/performed           Past Medical History:  Diagnosis Date  . Arthritis   . Depression   . GERD (gastroesophageal reflux disease)   . Glaucoma   . History of chicken pox   . History of colon polyps   . Hx: UTI (urinary tract infection)     Past Surgical History:  Procedure Laterality Date  . ABDOMINAL HYSTERECTOMY  1974   left ovary removed  . ANKLE SURGERY Left 1998  . APPENDECTOMY  1974  . BREAST BIOPSY Right 1988   NEG  . BREAST SURGERY Right 1988   biopsy  . CARDIAC CATHETERIZATION N/A 09/06/2015   Procedure: Right/Left Heart Cath and Coronary Angiography;  Surgeon: Yolonda Kida, MD;  Location: [place] [facility];  Service: Cardiovascular;  Laterality: N/A;  . CHOLECYSTECTOMY  1996  . EYE SURGERY Bilateral 2005/2006   cataract  . LAPAROSCOPIC GASTRIC BANDING WITH HIATAL HERNIA REPAIR  2000  . NISSEN FUNDOPLICATION  2992  . OOPHORECTOMY Left   . TONSILLECTOMY AND ADENOIDECTOMY  1940    There were no vitals filed for this visit.   Subjective Assessment - 03/30/20 1346    Subjective Patient denies of any  new symptoms since last therapy session. Denies falls or stumbles. She states she is compliant with her HEP daily but doesn't complete all of her exercises every day. No specific questions upon arrival.    Patient is accompained by: Family member    Pertinent History Pt presents to OP PT for evaluation of unsteadiness on feet. Pt referred by Neurologist who sees her for Lewy Body Dementian diagnosis, new as of a few months ago. Son present during evaluation to provide history, reports 1 close-call wherein he provided physical assistance to  prevent fall. Son reports pt AMB with a shufle and appears to catch her toes on sudden chances in surface height.    How long can you sit comfortably? N/A    How long can you stand comfortably? N/A    How long can you walk comfortably? ~ 1/2 mile    Diagnostic tests being followed for new dx of lewy body dementia    Currently in Pain? No/denies              [facility] PT Assessment - 03/30/20 1347      6 Minute Walk- Baseline   6 Minute Walk- Baseline yes    BP (mmHg) 115/70    HR (bpm) 97    02 Sat (%RA) 98 %  6 Minute walk- Post Test   6 Minute Walk Post Test yes    BP (mmHg) 145/71    HR (bpm) 120    02 Sat (%RA) 98 %      6 minute walk test results    Aerobic Endurance Distance Walked 1185    Endurance additional comments no assistive device, cues and reminder for where to turn      Standardized Balance Assessment   Standardized Balance Assessment Berg Balance Test;Dynamic Gait Index      Berg Balance Test   Sit to Stand Able to stand without using hands and stabilize independently    Standing Unsupported Able to stand safely 2 minutes    Sitting with Back Unsupported but Feet Supported on Floor or Stool Able to sit safely and securely 2 minutes    Stand to Sit Sits safely with minimal use of hands    Transfers Able to transfer safely, minor use of hands    Standing Unsupported with Eyes Closed Able to stand 10 seconds safely     Standing Unsupported with Feet Together Able to place feet together independently and stand 1 minute safely    From Standing, Reach Forward with Outstretched Arm Can reach confidently >25 cm (10")    From Standing Position, Pick up Object from Floor Able to pick up shoe safely and easily    From Standing Position, Turn to Look Behind Over each Shoulder Looks behind from both sides and weight shifts well    Turn 360 Degrees Able to turn 360 degrees safely in 4 seconds or less    Standing Unsupported, Alternately Place Feet on Step/Stool Able to stand independently and safely and complete 8 steps in 20 seconds    Standing Unsupported, One Foot in Front Able to plae foot ahead of the other independently and hold 30 seconds    Standing on One Leg Tries to lift leg/unable to hold 3 seconds but remains standing independently    Total Score 52      Dynamic Gait Index   Level Surface Normal    Change in Gait Speed Normal    Gait with Horizontal Head Turns Mild Impairment    Gait with Vertical Head Turns Mild Impairment    Gait and Pivot Turn Normal    Step Over Obstacle Moderate Impairment    Step Around Obstacles Normal    Steps Mild Impairment    Total Score 19             TREATMENT   Neuromuscular Re-education Updated outcome measures with patient including: 37mGait Speed:  Self-selected: 14.9s = 0.67 m/s, fastest: 8.9s = 1.12 m/s Single leg balance: R; 2s, L; 1.5s 6MWT: 1185' no assistive device,  DGI: 19/24 BERG: 52/56 5TSTS: 10.4s FOTO: 45   Ther-ex Precor BLE leg press 100# 3 x 20;   Pt educated throughout session about proper posture and technique with exercises. Improved exercise technique, movement at target joints, use of target muscles after min to mod verbal, visual, tactile cues.    Patient demonstrates excellent motivation during session. Updated outcome measures with patient today.  She demonstrates improvement in her self-selected gait speed however it  is still below 1.0 m/s which is required for full community ambulation.  Her single-leg balance is still below 5 seconds on both right and left sides.  Her 6-minute walk test distance improved significantly from 975 feet to 1185 feet without an assistive device.  Dynamic gait index improved from 18/24 to19/24  and BERG improved from 48/56 to 52/56.  Son completed FOTO scale today which declined but unclear in record whether patient completed FOTO herself during initial intake.  Overall patient is demonstrating significant improvement in her balance however remains limited in single-leg balance and gait speed.  Patient encouraged to continue HEP and follow-up scheduled.  Patient will continue benefit from skilled PT services to address deficits in balance, strength, and gait in order to improve function at home and decreased risk for falls.                                 PT Short Term Goals - 03/30/20 1340      PT SHORT TERM GOAL #1   Title After 4 weeks pt to demonstrate 10MWT >0.19ms    Baseline 0.544m at eval; 03/30/20: 0.67 m/s    Time 4    Period Weeks    Status Achieved    Target Date --      PT SHORT TERM GOAL #2   Title After 4 weeks patient to demonstrate improved SLS balance to >5 sec bilat and supervision level assistance.    Baseline <3 seconds at minGuard assist at eval; 03/30/20: R: 2s, L: 1.5s    Time 4    Period Weeks    Status On-going    Target Date 03/21/20             PT Long Term Goals - 03/30/20 1341      PT LONG TERM GOAL #1   Title After 8 weeks pt to demonstrate FOTO score >71 to show improved functional independence.    Baseline 64 at eval; 03/30/20: 45    Time 8    Period Weeks    Status On-going    Target Date 04/18/20      PT LONG TERM GOAL #2   Title After 8 weeks pt to demonstrate improved DGI to >21 to show decreased falls risk.    Baseline At eval: 18/24; 03/30/20: 19/24    Time 8    Period Weeks    Status Partially  Met    Target Date 04/18/20      PT LONG TERM GOAL #3   Title After 12 weeks pt to demonstrate improved 6MWT to 128568fage-matched norm value for females 80-84yo)    Baseline 03/02/20: 975' no assistive device; 03/30/20: 1185' no assistive device    Time 12    Period Weeks    Status Partially Met    Target Date 05/16/20      PT LONG TERM GOAL #4   Title After 12 weeks pt to demonstrate 10MWT>1.16m/1mo show reduced falls risk from a gait standpoint.    Baseline 0.49m/716meval; 03/30/20: Self-selected: 14.9s = 0.67 m/s, fastest: 8.9s = 1.12 m/s    Time 12    Period Weeks    Status Partially Met    Target Date 04/20/20                 Plan - 03/30/20 1340    Clinical Impression Statement Patient demonstrates excellent motivation during session.  Updated outcome measures with patient today.  She demonstrates improvement in her self-selected gait speed however it is still below 1.0 m/s which is required for full community ambulation.  Her single-leg balance is still below 5 seconds on both right and left sides.  Her 6-minute walk test distance improved significantly from 975 feet  to 1185 feet without an assistive device.  Dynamic gait index improved from 18/24 to19/24 and BERG improved from 48/56 to 52/56.  Son completed FOTO scale today which declined but unclear in record whether patient completed FOTO herself during initial intake.  Overall patient is demonstrating significant improvement in her balance however remains limited in single-leg balance and gait speed.  Patient encouraged to continue HEP and follow-up scheduled.  Patient will continue benefit from skilled PT services to address deficits in balance, strength, and gait in order to improve function at home and decreased risk for falls.    Personal Factors and Comorbidities Age;Behavior Pattern    Examination-Activity Limitations Squat;Stand;Stairs;Sit;Transfers    Examination-Participation Restrictions Cleaning;Yard  Work;Community Activity    Rehab Potential Good    PT Frequency 2x / week    PT Duration 12 weeks    PT Treatment/Interventions Therapeutic exercise;Manual techniques;Moist Heat;Ultrasound;Electrical Stimulation;Gait training;Stair training;Functional mobility training;Therapeutic activities;Balance training;Neuromuscular re-education;Patient/family education;Passive range of motion;Energy conservation;Orthotic Fit/Training;Joint Manipulations    PT Next Visit Plan Review HEP with patient, progress balance and strength, consider adding additional ankle strengthening    PT Home Exercise Plan Access Code: [email or web address]    Consulted and Agree with Plan of Care Patient           Patient will benefit from skilled therapeutic intervention in order to improve the following deficits and impairments:  Abnormal gait, Decreased balance, Decreased range of motion, Improper body mechanics, Decreased activity tolerance, Decreased knowledge of use of DME, Decreased strength, Postural dysfunction  Visit Diagnosis: Unsteadiness on feet  Other abnormalities of gait and mobility     Problem List Patient Active Problem List   Diagnosis Date Noted  . Diaphoresis 03/13/2020  . Change in hearing 01/17/2020  . Balance problem 01/17/2020  . Change in vision 11/26/2019  . Hypercholesterolemia 07/01/2019  . Elevated TSH 07/02/2018  . Right hip pain 07/02/2018  . Sweating abnormality 12/28/2017  . Aortic atherosclerosis ([place]) 10/29/2016  . B12 deficiency 10/29/2015  . Anemia 08/28/2015  . Cough 06/28/2015  . Paroxysmal atrial fibrillation ([place]) 06/08/2015  . Left hip pain 04/05/2015  . Osteoporosis, post-menopausal 10/10/2014  . Pain of left thumb 08/06/2014  . Health care maintenance 08/06/2014  . Osteoporosis 07/28/2014  . Bronchiectasis without acute exacerbation ([place]) 01/20/2014  . Multiple pulmonary nodules 01/20/2014  . Abnormal CXR 11/02/2013  . Chest pain 11/01/2013  . Fatigue 11/01/2013  .  Burning sensation in lower extremity 11/01/2013  . Environmental allergies 04/19/2013  . Arthritis 01/18/2013  . Depression 01/18/2013  . GERD (gastroesophageal reflux disease) 01/18/2013  . Glaucoma 01/18/2013  . History of colonic polyps 01/18/2013  . Carotid stenosis 01/18/2013   Phillips Grout PT, DPT, GCS  Zadrian Mccauley 03/30/2020, 4:38 PM  [place] MAIN [facility] SERVICES [address] [place], [place], [address] Phone: [phone]   Fax:  [phone]  Name: ZIYAN HILLMER MRN: [id number] Date of Birth: [birth year]

## 2020-04-04 ENCOUNTER — Ambulatory Visit: Payer: Medicare Other

## 2020-04-04 ENCOUNTER — Other Ambulatory Visit: Payer: Self-pay

## 2020-04-04 DIAGNOSIS — R2689 Other abnormalities of gait and mobility: Secondary | ICD-10-CM

## 2020-04-04 DIAGNOSIS — R2681 Unsteadiness on feet: Secondary | ICD-10-CM

## 2020-04-04 NOTE — Therapy (Signed)
[place] MAIN [facility] SERVICES [address] [place], [place], [address] Phone: [phone]   Fax:  [phone]  Physical Therapy Treatment  Patient Details  Name: Bridget Briggs MRN: [id number] Date of Birth: 20-Nov-1930 Referring Provider (PT): Jennings Books (neurology)    Encounter Date: 04/04/2020   PT End of Session - 04/04/20 1257    Visit Number 11    Number of Visits 25    Date for PT Re-Evaluation 05/16/20    Authorization Type BCBS Medicare    Authorization Time Period Initial evaluation: 02/22/20, PN: 03/30/20    PT Start Time 1300    PT Stop Time 1345    PT Time Calculation (min) 45 min    Equipment Utilized During Treatment Gait belt    Activity Tolerance Patient tolerated treatment well    Behavior During Therapy WFL for tasks assessed/performed           Past Medical History:  Diagnosis Date   Arthritis    Depression    GERD (gastroesophageal reflux disease)    Glaucoma    History of chicken pox    History of colon polyps    Hx: UTI (urinary tract infection)     Past Surgical History:  Procedure Laterality Date   ABDOMINAL HYSTERECTOMY  1974   left ovary removed   ANKLE SURGERY Left [place] Right 1988   NEG   BREAST SURGERY Right 1988   biopsy   CARDIAC CATHETERIZATION N/A 09/06/2015   Procedure: Right/Left Heart Cath and Coronary Angiography;  Surgeon: Yolonda Kida, MD;  Location: [place] [facility];  Service: Cardiovascular;  Laterality: N/A;   CHOLECYSTECTOMY  1996   EYE SURGERY Bilateral 2005/2006   cataract   LAPAROSCOPIC GASTRIC BANDING WITH HIATAL HERNIA REPAIR  1660   NISSEN FUNDOPLICATION  6004   OOPHORECTOMY Left    TONSILLECTOMY AND ADENOIDECTOMY  1940    There were no vitals filed for this visit.   Subjective Assessment - 04/04/20 1303    Subjective Patient denies of any pain or soreness since last therapy session. No falls or  incidents have occurred. Patient states she feels "good."    Patient is accompained by: Family member    Pertinent History Pt presents to OP PT for evaluation of unsteadiness on feet. Pt referred by Neurologist who sees her for Lewy Body Dementian diagnosis, new as of a few months ago. Son present during evaluation to provide history, reports 1 close-call wherein he provided physical assistance to  prevent fall. Son reports pt AMB with a shufle and appears to catch her toes on sudden chances in surface height.    How long can you sit comfortably? N/A    How long can you stand comfortably? N/A    How long can you walk comfortably? ~ 1/2 mile    Diagnostic tests being followed for new dx of lewy body dementia    Currently in Pain? No/denies           Ther-ex NuStep L2-4 x 5 minutes for warm-up during history, therapist adjusting resistance throughout and monitoring for fatigue (30mnutes unbilled); Sit to stand with 5kg overhead ball press 2 x 10, CGA only; Precor BLE leg press 100# 1 x 10, 85# 2 x 20 weight not progressed due to complains of increase in L knee pain; Standing heel raises with BUE support x 20; Seated clamshells with green theraband x 20 Step ups using  bilateral railings forward/lateral x 10 reps each way  Neuromuscular Re-education All balance exercises performed without UE support and CGA provided by therapist throughout unless otherwise noted: Step-ups to 5" step with 2" Airex pad on top alternating leading LE x 10 each Airex WBOS Eyes open and eyes closed x 30s each Airex NBOS eyes open and eyes closed x 30s each Airex horizontal and vertical head turns x 3 sets of 30 seconds each  Airex beam tandem walking x 3 laps  Airex beam tandem static balance eyes open/closed x 30s each  Pt educated throughout session about proper posture and technique with exercises. Improved exercise technique, movement at target joints, use of target muscles after min to mod verbal, visual,  tactile cues.    Patient progressed with increased reps with strengthening BLE exercises. Patient tolerated exercises with increased fatigue with activity as evidenced by repeated seated therapeutic rest breaks. Therapist provided verbal cues to maintain COG within BOS using ankle strategy. She will benefit from PT services to address deficits in strength, balance, and mobility in order to return to full function at home.       PT Short Term Goals - 03/30/20 1340      PT SHORT TERM GOAL #1   Title After 4 weeks pt to demonstrate 10MWT >0.9ms    Baseline 0.5546m at eval; 03/30/20: 0.67 m/s    Time 4    Period Weeks    Status Achieved    Target Date --      PT SHORT TERM GOAL #2   Title After 4 weeks patient to demonstrate improved SLS balance to >5 sec bilat and supervision level assistance.    Baseline <3 seconds at minGuard assist at eval; 03/30/20: R: 2s, L: 1.5s    Time 4    Period Weeks    Status On-going    Target Date 03/21/20             PT Long Term Goals - 03/30/20 1341      PT LONG TERM GOAL #1   Title After 8 weeks pt to demonstrate FOTO score >71 to show improved functional independence.    Baseline 64 at eval; 03/30/20: 45    Time 8    Period Weeks    Status On-going    Target Date 04/18/20      PT LONG TERM GOAL #2   Title After 8 weeks pt to demonstrate improved DGI to >21 to show decreased falls risk.    Baseline At eval: 18/24; 03/30/20: 19/24    Time 8    Period Weeks    Status Partially Met    Target Date 04/18/20      PT LONG TERM GOAL #3   Title After 12 weeks pt to demonstrate improved 6MWT to 128524fage-matched norm value for females 80-89yo)    Baseline 03/02/20: 975' no assistive device; 03/30/20: 1185' no assistive device    Time 12    Period Weeks    Status Partially Met    Target Date 05/16/20      PT LONG TERM GOAL #4   Title After 12 weeks pt to demonstrate 10MWT>1.46m/17mo show reduced falls risk from a gait standpoint.     Baseline 0.14m/746meval; 03/30/20: Self-selected: 14.9s = 0.67 m/s, fastest: 8.9s = 1.12 m/s    Time 12    Period Weeks    Status Partially Met    Target Date 04/20/20  Plan - 04/04/20 1348    Clinical Impression Statement Patient progressed with increased reps with strengthening BLE exercises. Patient tolerated exercises with increased fatigue with activity as evidenced by repeated seated therapeutic rest breaks. Therapist provided verbal cues to maintain COG within BOS using ankle strategy. She will benefit from PT services to address deficits in strength, balance, and mobility in order to return to full function at home.    Personal Factors and Comorbidities Age;Behavior Pattern    Examination-Activity Limitations Squat;Stand;Stairs;Sit;Transfers    Examination-Participation Restrictions Cleaning;Yard Work;Community Activity    Rehab Potential Good    PT Frequency 2x / week    PT Duration 12 weeks    PT Treatment/Interventions Therapeutic exercise;Manual techniques;Moist Heat;Ultrasound;Electrical Stimulation;Gait training;Stair training;Functional mobility training;Therapeutic activities;Balance training;Neuromuscular re-education;Patient/family education;Passive range of motion;Energy conservation;Orthotic Fit/Training;Joint Manipulations    PT Next Visit Plan Review HEP with patient, progress balance and strength, consider adding additional ankle strengthening    PT Home Exercise Plan Access Code: [email or web address]    Consulted and Agree with Plan of Care Patient           Patient will benefit from skilled therapeutic intervention in order to improve the following deficits and impairments:  Abnormal gait, Decreased balance, Decreased range of motion, Improper body mechanics, Decreased activity tolerance, Decreased knowledge of use of DME, Decreased strength, Postural dysfunction  Visit Diagnosis: Unsteadiness on feet  Other abnormalities of gait and  mobility     Problem List Patient Active Problem List   Diagnosis Date Noted   Diaphoresis 03/13/2020   Change in hearing 01/17/2020   Balance problem 01/17/2020   Change in vision 11/26/2019   Hypercholesterolemia 07/01/2019   Elevated TSH 07/02/2018   Right hip pain 07/02/2018   Sweating abnormality 12/28/2017   Aortic atherosclerosis ([place]) 10/29/2016   B12 deficiency 10/29/2015   Anemia 08/28/2015   Cough 06/28/2015   Paroxysmal atrial fibrillation ([place]) 06/08/2015   Left hip pain 04/05/2015   Osteoporosis, post-menopausal 10/10/2014   Pain of left thumb 08/06/2014   Health care maintenance 08/06/2014   Osteoporosis 07/28/2014   Bronchiectasis without acute exacerbation ([place]) 01/20/2014   Multiple pulmonary nodules 01/20/2014   Abnormal CXR 11/02/2013   Chest pain 11/01/2013   Fatigue 11/01/2013   Burning sensation in lower extremity 11/01/2013   Environmental allergies 04/19/2013   Arthritis 01/18/2013   Depression 01/18/2013   GERD (gastroesophageal reflux disease) 01/18/2013   Glaucoma 01/18/2013   History of colonic polyps 01/18/2013   Carotid stenosis 01/18/2013   Karl Luke PT, DPT Netta Corrigan 04/04/2020, 5:11 PM  [facility] MAIN [facility] SERVICES [address] [place], [place], [address] Phone: [phone]   Fax:  [phone]  Name: Bridget Briggs MRN: [id number] Date of Birth: [birth year]

## 2020-04-06 ENCOUNTER — Other Ambulatory Visit: Payer: Self-pay

## 2020-04-06 ENCOUNTER — Ambulatory Visit: Payer: Medicare Other

## 2020-04-06 DIAGNOSIS — R2681 Unsteadiness on feet: Secondary | ICD-10-CM

## 2020-04-06 DIAGNOSIS — R2689 Other abnormalities of gait and mobility: Secondary | ICD-10-CM

## 2020-04-06 NOTE — Therapy (Signed)
[place] MAIN [facility] SERVICES [address] [place], [place], [address] Phone: [phone]   Fax:  [phone]  Physical Therapy Treatment  Patient Details  Name: Bridget Briggs MRN: [id number] Date of Birth: 04/02/1931 Referring Provider (PT): Jennings Books (neurology)    Encounter Date: 04/06/2020   PT End of Session - 04/06/20 1435    Visit Number 12    Number of Visits 25    Date for PT Re-Evaluation 05/16/20    Authorization Type BCBS Medicare    Authorization Time Period Initial evaluation: 02/22/20, PN: 03/30/20    PT Start Time 1430    PT Stop Time 1515    PT Time Calculation (min) 45 min    Equipment Utilized During Treatment Gait belt    Activity Tolerance Patient tolerated treatment well    Behavior During Therapy WFL for tasks assessed/performed           Past Medical History:  Diagnosis Date  . Arthritis   . Depression   . GERD (gastroesophageal reflux disease)   . Glaucoma   . History of chicken pox   . History of colon polyps   . Hx: UTI (urinary tract infection)     Past Surgical History:  Procedure Laterality Date  . ABDOMINAL HYSTERECTOMY  1974   left ovary removed  . ANKLE SURGERY Left 1998  . APPENDECTOMY  1974  . BREAST BIOPSY Right 1988   NEG  . BREAST SURGERY Right 1988   biopsy  . CARDIAC CATHETERIZATION N/A 09/06/2015   Procedure: Right/Left Heart Cath and Coronary Angiography;  Surgeon: Yolonda Kida, MD;  Location: [place] [facility];  Service: Cardiovascular;  Laterality: N/A;  . CHOLECYSTECTOMY  1996  . EYE SURGERY Bilateral 2005/2006   cataract  . LAPAROSCOPIC GASTRIC BANDING WITH HIATAL HERNIA REPAIR  2000  . NISSEN FUNDOPLICATION  7412  . OOPHORECTOMY Left   . TONSILLECTOMY AND ADENOIDECTOMY  1940    There were no vitals filed for this visit.   Subjective Assessment - 04/06/20 1434    Subjective Patient denies of any new symptoms since the last therapy session. She states she  feels great today.    Patient is accompained by: Family member    Pertinent History Pt presents to OP PT for evaluation of unsteadiness on feet. Pt referred by Neurologist who sees her for Lewy Body Dementian diagnosis, new as of a few months ago. Son present during evaluation to provide history, reports 1 close-call wherein he provided physical assistance to  prevent fall. Son reports pt AMB with a shufle and appears to catch her toes on sudden chances in surface height.    How long can you sit comfortably? N/A    How long can you stand comfortably? N/A    How long can you walk comfortably? ~ 1/2 mile    Diagnostic tests being followed for new dx of lewy body dementia    Currently in Pain? No/denies              Ther-ex Sit to stand with 5kg overhead ball press 2 x 10, CGA only; Precor BLE leg press 100# 1 x 10, 85# 2 x 20 weight not progressed due to complains of increase in L knee pain; Standing heel raises with BUE support x 20; Seated clamshells with green theraband x 20 Step ups using bilateral railings forward/lateral x 10 reps each way Seated marches with 2.5# 2 x 20 reps Seated LAQ with 2.5# 2  x 20 reps  Standing hip abduction, hip extension with 2.5# x 20 reps each way   Neuromuscular Re-education All balance exercises performed without UE support and CGA provided by therapist throughout unless otherwise noted: Step-ups to 5" step with 2" Airex pad on top alternating leading LE x 10 each Airex WBOS Eyes open and eyes closed x 30s each Airex NBOS eyes open and eyes closed x 30s each Airex horizontal and vertical head turns x 3 sets of 30 seconds each  Airex heel raises using UE for support x 20 Airex beam tandem walking x 3 laps  Airex beam tandem static balance eyes open/closed x 30s each Semi foam heel raises x 20 reps with BUE support  Pt educated throughout session about proper posture and technique with exercises. Improved exercise technique, movement at target  joints, use of target muscles after min to mod verbal, visual, tactile cues.     Progressed strength training today with fair tolerance to activity. Patient reports of increased soreness and fatigue with activity due to decreased tolerance to exercise. Therapist provided verbal cues to maintain neutral spine when completing standing hip exercises. Will continue to focus on addressing deficits in strength, balance, and mobility in order to return to full function at home.                         PT Short Term Goals - 03/30/20 1340      PT SHORT TERM GOAL #1   Title After 4 weeks pt to demonstrate 10MWT >0.57ms    Baseline 0.567m at eval; 03/30/20: 0.67 m/s    Time 4    Period Weeks    Status Achieved    Target Date --      PT SHORT TERM GOAL #2   Title After 4 weeks patient to demonstrate improved SLS balance to >5 sec bilat and supervision level assistance.    Baseline <3 seconds at minGuard assist at eval; 03/30/20: R: 2s, L: 1.5s    Time 4    Period Weeks    Status On-going    Target Date 03/21/20             PT Long Term Goals - 03/30/20 1341      PT LONG TERM GOAL #1   Title After 8 weeks pt to demonstrate FOTO score >71 to show improved functional independence.    Baseline 64 at eval; 03/30/20: 45    Time 8    Period Weeks    Status On-going    Target Date 04/18/20      PT LONG TERM GOAL #2   Title After 8 weeks pt to demonstrate improved DGI to >21 to show decreased falls risk.    Baseline At eval: 18/24; 03/30/20: 19/24    Time 8    Period Weeks    Status Partially Met    Target Date 04/18/20      PT LONG TERM GOAL #3   Title After 12 weeks pt to demonstrate improved 6MWT to [id number]-matched norm value for females 80-89yo)    Baseline 03/02/20: 975' no assistive device; 03/30/20: 1185' no assistive device    Time 12    Period Weeks    Status Partially Met    Target Date 05/16/20      PT LONG TERM GOAL #4   Title After 12 weeks  pt to demonstrate 10MWT>1.79m/49mo show reduced falls risk from a gait standpoint.  Baseline 0.28ms @ eval; 03/30/20: Self-selected: 14.9s = 0.67 m/s, fastest: 8.9s = 1.12 m/s    Time 12    Period Weeks    Status Partially Met    Target Date 04/20/20                 Plan - 04/06/20 1458    Clinical Impression Statement Progressed strength training today with fair tolerance to activity. Patient reports of increased soreness and fatigue with activity due to decreased tolerance to exercise. Therapist provided verbal cues to maintain neutral spine when completing standing hip exercises. Will continue to focus on addressing deficits in strength, balance, and mobility in order to return to full function at home.    Personal Factors and Comorbidities Age;Behavior Pattern    Examination-Activity Limitations Squat;Stand;Stairs;Sit;Transfers    Examination-Participation Restrictions Cleaning;Yard Work;Community Activity    Rehab Potential Good    PT Frequency 2x / week    PT Duration 12 weeks    PT Treatment/Interventions Therapeutic exercise;Manual techniques;Moist Heat;Ultrasound;Electrical Stimulation;Gait training;Stair training;Functional mobility training;Therapeutic activities;Balance training;Neuromuscular re-education;Patient/family education;Passive range of motion;Energy conservation;Orthotic Fit/Training;Joint Manipulations    PT Next Visit Plan Review HEP with patient, progress balance and strength, consider adding additional ankle strengthening    PT Home Exercise Plan Access Code: [email or web address]    Consulted and Agree with Plan of Care Patient           Patient will benefit from skilled therapeutic intervention in order to improve the following deficits and impairments:  Abnormal gait, Decreased balance, Decreased range of motion, Improper body mechanics, Decreased activity tolerance, Decreased knowledge of use of DME, Decreased strength, Postural dysfunction  Visit  Diagnosis: Unsteadiness on feet  Other abnormalities of gait and mobility     Problem List Patient Active Problem List   Diagnosis Date Noted  . Diaphoresis 03/13/2020  . Change in hearing 01/17/2020  . Balance problem 01/17/2020  . Change in vision 11/26/2019  . Hypercholesterolemia 07/01/2019  . Elevated TSH 07/02/2018  . Right hip pain 07/02/2018  . Sweating abnormality 12/28/2017  . Aortic atherosclerosis ([place] 10/29/2016  . B12 deficiency 10/29/2015  . Anemia 08/28/2015  . Cough 06/28/2015  . Paroxysmal atrial fibrillation ([place] 06/08/2015  . Left hip pain 04/05/2015  . Osteoporosis, post-menopausal 10/10/2014  . Pain of left thumb 08/06/2014  . Health care maintenance 08/06/2014  . Osteoporosis 07/28/2014  . Bronchiectasis without acute exacerbation ([place] 01/20/2014  . Multiple pulmonary nodules 01/20/2014  . Abnormal CXR 11/02/2013  . Chest pain 11/01/2013  . Fatigue 11/01/2013  . Burning sensation in lower extremity 11/01/2013  . Environmental allergies 04/19/2013  . Arthritis 01/18/2013  . Depression 01/18/2013  . GERD (gastroesophageal reflux disease) 01/18/2013  . Glaucoma 01/18/2013  . History of colonic polyps 01/18/2013  . Carotid stenosis 01/18/2013    HKarl LukePT, DPT HNetta Corrigan11/18/2021, 3:22 PM  [place] [facility]trSERVICES [address] [place] [address]Phone: [phone]  Fax:  [phone] Name: Bridget HECKERTMRN: [id number] of Birth: 924-May-1932

## 2020-04-10 ENCOUNTER — Ambulatory Visit: Payer: Medicare Other | Admitting: Physical Therapy

## 2020-04-10 ENCOUNTER — Encounter: Payer: Self-pay | Admitting: Internal Medicine

## 2020-04-10 ENCOUNTER — Encounter: Payer: Self-pay | Admitting: Physical Therapy

## 2020-04-10 ENCOUNTER — Other Ambulatory Visit: Payer: Self-pay

## 2020-04-10 ENCOUNTER — Ambulatory Visit (INDEPENDENT_AMBULATORY_CARE_PROVIDER_SITE_OTHER): Payer: Medicare Other | Admitting: Internal Medicine

## 2020-04-10 VITALS — BP 122/72 | HR 83 | Temp 97.7°F | Resp 16 | Ht 62.0 in | Wt 184.0 lb

## 2020-04-10 DIAGNOSIS — I700 Atherosclerosis of aorta: Secondary | ICD-10-CM

## 2020-04-10 DIAGNOSIS — R2681 Unsteadiness on feet: Secondary | ICD-10-CM | POA: Diagnosis not present

## 2020-04-10 DIAGNOSIS — D649 Anemia, unspecified: Secondary | ICD-10-CM | POA: Diagnosis not present

## 2020-04-10 DIAGNOSIS — E7800 Pure hypercholesterolemia, unspecified: Secondary | ICD-10-CM | POA: Diagnosis not present

## 2020-04-10 DIAGNOSIS — R2689 Other abnormalities of gait and mobility: Secondary | ICD-10-CM

## 2020-04-10 DIAGNOSIS — F0390 Unspecified dementia without behavioral disturbance: Secondary | ICD-10-CM

## 2020-04-10 DIAGNOSIS — I480 Paroxysmal atrial fibrillation: Secondary | ICD-10-CM

## 2020-04-10 DIAGNOSIS — J479 Bronchiectasis, uncomplicated: Secondary | ICD-10-CM

## 2020-04-10 LAB — LIPID PANEL
Cholesterol: 147 mg/dL (ref 0–200)
HDL: 53.9 mg/dL (ref >39.00)
LDL Cholesterol: 64 mg/dL (ref 0–99)
NonHDL: 93.33
Total CHOL/HDL Ratio: 3
Triglycerides: 147 mg/dL (ref 0.0–149.0)
VLDL: 29.4 mg/dL (ref 0.0–40.0)

## 2020-04-10 LAB — HEPATIC FUNCTION PANEL
ALT: 15 U/L (ref 0–35)
AST: 17 U/L (ref 0–37)
Albumin: 4.2 g/dL (ref 3.5–5.2)
Alkaline Phosphatase: 62 U/L (ref 39–117)
Bilirubin, Direct: 0.2 mg/dL (ref 0.0–0.3)
Total Bilirubin: 1 mg/dL (ref 0.2–1.2)
Total Protein: 6.7 g/dL (ref 6.0–8.3)

## 2020-04-10 LAB — BASIC METABOLIC PANEL
BUN: 20 mg/dL (ref 6–23)
CO2: 25 mEq/L (ref 19–32)
Calcium: 10.1 mg/dL (ref 8.4–10.5)
Chloride: 103 mEq/L (ref 96–112)
Creatinine, Ser: 1.04 mg/dL (ref 0.40–1.20)
GFR: 48.43 mL/min — ABNORMAL LOW (ref >60.00)
Glucose, Bld: 92 mg/dL (ref 70–99)
Potassium: 4.5 mEq/L (ref 3.5–5.1)
Sodium: 137 mEq/L (ref 135–145)

## 2020-04-10 NOTE — Therapy (Signed)
[place] MAIN [facility] SERVICES [address] [place], [place], [address] Phone: [phone]   Fax:  [phone]  Physical Therapy Treatment  Patient Details  Name: Bridget Briggs MRN: [id number] Date of Birth: July 03, 1930 Referring Provider (PT): Jennings Books (neurology)    Encounter Date: 04/10/2020   PT End of Session - 04/10/20 1312    Visit Number 13    Number of Visits 25    Date for PT Re-Evaluation 05/16/20    Authorization Type BCBS Medicare    Authorization Time Period Initial evaluation: 02/22/20, PN: 03/30/20    PT Start Time 1300    PT Stop Time 1345    PT Time Calculation (min) 45 min    Equipment Utilized During Treatment Gait belt    Activity Tolerance Patient tolerated treatment well    Behavior During Therapy [facility] for tasks assessed/performed           Past Medical History:  Diagnosis Date  . Arthritis   . Depression   . GERD (gastroesophageal reflux disease)   . Glaucoma   . History of chicken pox   . History of colon polyps   . Hx: UTI (urinary tract infection)     Past Surgical History:  Procedure Laterality Date  . ABDOMINAL HYSTERECTOMY  1974   left ovary removed  . ANKLE SURGERY Left 1998  . APPENDECTOMY  1974  . BREAST BIOPSY Right 1988   NEG  . BREAST SURGERY Right 1988   biopsy  . CARDIAC CATHETERIZATION N/A 09/06/2015   Procedure: Right/Left Heart Cath and Coronary Angiography;  Surgeon: Yolonda Kida, MD;  Location: [place] [facility];  Service: Cardiovascular;  Laterality: N/A;  . CHOLECYSTECTOMY  1996  . EYE SURGERY Bilateral 2005/2006   cataract  . LAPAROSCOPIC GASTRIC BANDING WITH HIATAL HERNIA REPAIR  2000  . NISSEN FUNDOPLICATION  5830  . OOPHORECTOMY Left   . TONSILLECTOMY AND ADENOIDECTOMY  1940    There were no vitals filed for this visit.   Subjective Assessment - 04/10/20 1310    Subjective Patient states that she has no pain or soreness since last therapy session. She  feels good overall.    Patient is accompained by: Family member    Pertinent History Pt presents to OP PT for evaluation of unsteadiness on feet. Pt referred by Neurologist who sees her for Lewy Body Dementian diagnosis, new as of a few months ago. Son present during evaluation to provide history, reports 1 close-call wherein he provided physical assistance to  prevent fall. Son reports pt AMB with a shufle and appears to catch her toes on sudden chances in surface height.    How long can you sit comfortably? N/A    How long can you stand comfortably? N/A    How long can you walk comfortably? ~ 1/2 mile    Diagnostic tests being followed for new dx of lewy body dementia    Currently in Pain? No/denies           Ther-ex Nu step L2 x 4 mins (unbilled) Sit to stand with 5kg overhead ball press 2 x 10 Precor BLE leg press 100# 1 x 10, 85# 2 x 20 weight not progressed due to complains of increase in L knee pain; Standing heel raises with BUE support x 30; Seated clamshells with green theraband x 20 Step ups using bilateral railings forward/lateral x 10 reps each way Seated marches with 3# x 30  Seated LAQ with 3#  x 30 each leg Standing hip abduction, hip extension, HS curls with 3# x 30 reps each way   Neuromuscular Re-education All balance exercises performed without UE support and CGA provided by therapist throughout unless otherwise noted: Step-ups to 5" step with 2" Airex pad on top alternating leading LE x 10 each Airex WBOS Eyes open and eyes closed x 30s each Airex NBOS eyes open and eyes closed x 30s each Airex horizontal and vertical head turns x 3 sets of 30 seconds each  Airex heel raises using UE for support x 20 Airex beam tandem walking x 3 laps  Airex beam tandem static balance eyes open/closed x 30s each Semi foam heel raises x 20 reps with BUE support  Pt educated throughout session about proper posture and technique with exercises. Improved exercise technique,  movement at target joints, use of target muscles after min to mod verbal, visual, tactile cues.   Strength training focused on improving muscle recruitment with BLE hip and knee strengthening exercises. Progressed patient with increased ankle weights today due to good outcomes from last therapy session. Patient reports of increased L knee soreness and pain with closed chain exercises. Patient will continue to focus on addressing deficits in strength, balance, and mobility in order to return to full function at home.        PT Short Term Goals - 03/30/20 1340      PT SHORT TERM GOAL #1   Title After 4 weeks pt to demonstrate 10MWT >0.8ms    Baseline 0.52m at eval; 03/30/20: 0.67 m/s    Time 4    Period Weeks    Status Achieved    Target Date --      PT SHORT TERM GOAL #2   Title After 4 weeks patient to demonstrate improved SLS balance to >5 sec bilat and supervision level assistance.    Baseline <3 seconds at minGuard assist at eval; 03/30/20: R: 2s, L: 1.5s    Time 4    Period Weeks    Status On-going    Target Date 03/21/20             PT Long Term Goals - 03/30/20 1341      PT LONG TERM GOAL #1   Title After 8 weeks pt to demonstrate FOTO score >71 to show improved functional independence.    Baseline 64 at eval; 03/30/20: 45    Time 8    Period Weeks    Status On-going    Target Date 04/18/20      PT LONG TERM GOAL #2   Title After 8 weeks pt to demonstrate improved DGI to >21 to show decreased falls risk.    Baseline At eval: 18/24; 03/30/20: 19/24    Time 8    Period Weeks    Status Partially Met    Target Date 04/18/20      PT LONG TERM GOAL #3   Title After 12 weeks pt to demonstrate improved 6MWT to 128538fage-matched norm value for females 80-89yo)    Baseline 03/02/20: 975' no assistive device; 03/30/20: 1185' no assistive device    Time 12    Period Weeks    Status Partially Met    Target Date 05/16/20      PT LONG TERM GOAL #4   Title After  12 weeks pt to demonstrate 10MWT>1.18m/96mo show reduced falls risk from a gait standpoint.    Baseline 0.54m/82meval; 03/30/20: Self-selected: 14.9s = 0.67 m/s, fastest:  8.9s = 1.12 m/s    Time 12    Period Weeks    Status Partially Met    Target Date 04/20/20                 Plan - 04/10/20 1444    Clinical Impression Statement Strength training focused on improving muscle recruitment with BLE hip and knee strengthening exercises. Progressed patient with increased ankle weights today due to good outcomes from last therapy session. Patient reports of increased L knee soreness and pain with closed chain exercises. Patient will continue to focus on addressing deficits in strength, balance, and mobility in order to return to full function at home.    Personal Factors and Comorbidities Age;Behavior Pattern    Examination-Activity Limitations Squat;Stand;Stairs;Sit;Transfers    Examination-Participation Restrictions Cleaning;Yard Work;Community Activity    Rehab Potential Good    PT Frequency 2x / week    PT Duration 12 weeks    PT Treatment/Interventions Therapeutic exercise;Manual techniques;Moist Heat;Ultrasound;Electrical Stimulation;Gait training;Stair training;Functional mobility training;Therapeutic activities;Balance training;Neuromuscular re-education;Patient/family education;Passive range of motion;Energy conservation;Orthotic Fit/Training;Joint Manipulations    PT Next Visit Plan Review HEP with patient, progress balance and strength, consider adding additional ankle strengthening    PT Home Exercise Plan Access Code: [email or web address]    Consulted and Agree with Plan of Care Patient           Patient will benefit from skilled therapeutic intervention in order to improve the following deficits and impairments:  Abnormal gait, Decreased balance, Decreased range of motion, Improper body mechanics, Decreased activity tolerance, Decreased knowledge of use of DME, Decreased strength,  Postural dysfunction  Visit Diagnosis: Unsteadiness on feet  Other abnormalities of gait and mobility     Problem List Patient Active Problem List   Diagnosis Date Noted  . Diaphoresis 03/13/2020  . Change in hearing 01/17/2020  . Balance problem 01/17/2020  . Change in vision 11/26/2019  . Hypercholesterolemia 07/01/2019  . Elevated TSH 07/02/2018  . Right hip pain 07/02/2018  . Sweating abnormality 12/28/2017  . Aortic atherosclerosis ([place]) 10/29/2016  . B12 deficiency 10/29/2015  . Anemia 08/28/2015  . Cough 06/28/2015  . Paroxysmal atrial fibrillation ([place]) 06/08/2015  . Left hip pain 04/05/2015  . Osteoporosis, post-menopausal 10/10/2014  . Pain of left thumb 08/06/2014  . Health care maintenance 08/06/2014  . Osteoporosis 07/28/2014  . Bronchiectasis without acute exacerbation ([place]) 01/20/2014  . Multiple pulmonary nodules 01/20/2014  . Abnormal CXR 11/02/2013  . Chest pain 11/01/2013  . Fatigue 11/01/2013  . Burning sensation in lower extremity 11/01/2013  . Environmental allergies 04/19/2013  . Arthritis 01/18/2013  . Depression 01/18/2013  . GERD (gastroesophageal reflux disease) 01/18/2013  . Glaucoma 01/18/2013  . History of colonic polyps 01/18/2013  . Carotid stenosis 01/18/2013   Karl Luke PT, DPT Netta Corrigan 04/10/2020, 2:46 PM  [place] MAIN [facility] SERVICES [address] [place], [place], [address] Phone: [phone]   Fax:  [phone]  Name: Bridget Briggs MRN: [id number] Date of Birth: [birth year]

## 2020-04-10 NOTE — Telephone Encounter (Signed)
Pt has appt today

## 2020-04-10 NOTE — Progress Notes (Signed)
Patient ID: Bridget Briggs, female   DOB: 1930-06-04, 86 y.o.   MRN: [id number]   Subjective:    Patient ID: Bridget Briggs, female    DOB: Oct 01, 1930, 86 y.o.   MRN: [id number]  HPI This visit occurred during the SARS-CoV-2 public health emergency.  Safety protocols were in place, including screening questions prior to the visit, additional usage of staff PPE, and extensive cleaning of exam room while observing appropriate contact time as indicated for disinfecting solutions.\  Patient here for a scheduled follow up.  She is accompanied by her son.  History obtained from both of them.  She has been seeing neurology for concerns regarding dementia - with cognitive changes, visual disturbances/nallucination of dark paper moving across her vision, tremor in bilateral hands, mild cog wheeling, shuffling gain and imbalance.  MRI - no evidence of acute intracranial abnormality, cerebral atrophy.  S/p normal EEG.  Discussed assisted living facilities.  She agreed to tour and look into some options.  Neurology discussed formal driving evaluation.  She reports she feels good.  No chest pain or sob reported.  No increased cough or congestion.  No abdominal pain or bowel change reported.  Going to PT.  Working on gain and strengthening.    Past Medical History:  Diagnosis Date  . Arthritis   . Depression   . GERD (gastroesophageal reflux disease)   . Glaucoma   . History of chicken pox   . History of colon polyps   . Hx: UTI (urinary tract infection)    Past Surgical History:  Procedure Laterality Date  . ABDOMINAL HYSTERECTOMY  1974   left ovary removed  . ANKLE SURGERY Left 1998  . APPENDECTOMY  1974  . BREAST BIOPSY Right 1988   NEG  . BREAST SURGERY Right 1988   biopsy  . CARDIAC CATHETERIZATION N/A 09/06/2015   Procedure: Right/Left Heart Cath and Coronary Angiography;  Surgeon: Yolonda Kida, MD;  Location: [facility];  Service: Cardiovascular;  Laterality: N/A;  .  CHOLECYSTECTOMY  1996  . EYE SURGERY Bilateral 2005/2006   cataract  . LAPAROSCOPIC GASTRIC BANDING WITH HIATAL HERNIA REPAIR  2000  . NISSEN FUNDOPLICATION  9030  . OOPHORECTOMY Left   . TONSILLECTOMY AND ADENOIDECTOMY  1940   Family History  Problem Relation Age of Onset  . Arthritis Mother   . Leukemia Mother   . Lung cancer Father   . Osteoporosis Other   . Breast cancer Neg Hx    Social History   Socioeconomic History  . Marital status: Widowed    Spouse name: Not on file  . Number of children: Not on file  . Years of education: Not on file  . Highest education level: Not on file  Occupational History  . Not on file  Tobacco Use  . Smoking status: Former Smoker    Packs/day: 1.00    Years: 30.00    Pack years: 30.00    Types: Cigarettes    Quit date: 05/20/1988    Years since quitting: 31.9  . Smokeless tobacco: Never Used  Vaping Use  . Vaping Use: Never used  Substance and Sexual Activity  . Alcohol use: No    Alcohol/week: 0.0 standard drinks  . Drug use: No  . Sexual activity: Never  Other Topics Concern  . Not on file  Social History Narrative  . Not on file   Social Determinants of Health   Financial Resource Strain: Low Risk   . Difficulty  of Paying Living Expenses: Not hard at all  Food Insecurity: No Food Insecurity  . Worried About Charity fundraiser in the Last Year: Never true  . Ran Out of Food in the Last Year: Never true  Transportation Needs: No Transportation Needs  . Lack of Transportation (Medical): No  . Lack of Transportation (Non-Medical): No  Physical Activity: Insufficiently Active  . Days of Exercise per Week: 5 days  . Minutes of Exercise per Session: 20 min  Stress: No Stress Concern Present  . Feeling of Stress : Not at all  Social Connections: Moderately Isolated  . Frequency of Communication with Friends and Family: Once a week  . Frequency of Social Gatherings with Friends and Family: Once a week  . Attends Religious  Services: 1 to 4 times per year  . Active Member of Clubs or Organizations: Yes  . Attends Archivist Meetings: 1 to 4 times per year  . Marital Status: Widowed    Outpatient Encounter Medications as of 04/10/2020  Medication Sig  . apixaban (ELIQUIS) 5 MG TABS tablet Take 1 tablet (5 mg total) by mouth 2 (two) times daily.  . APOAEQUORIN PO Take by mouth.  . beta carotene w/minerals (OCUVITE) tablet Take 1 tablet by mouth daily.  . Blood Pressure Monitor MISC Use 1 each as directed  . calcium-vitamin D (OSCAL WITH D) 500-200 MG-UNIT per tablet Take 1 tablet by mouth daily.  . cholecalciferol (VITAMIN D3) 25 MCG (1000 UNIT) tablet Take 1,000 Units by mouth daily.   . diphenhydramine-acetaminophen (TYLENOL PM) 25-500 MG TABS Take 1 tablet by mouth at bedtime as needed.  . dorzolamide-timolol (COSOPT) 22.3-6.8 MG/ML ophthalmic solution Place 1 drop into both eyes 2 (two) times daily.  . fluocinolone (SYNALAR) 0.01 % external solution Apply topically 2 (two) times daily as needed.  . latanoprost (XALATAN) 0.005 % ophthalmic solution Place 1 drop into both eyes at bedtime.  . metoprolol tartrate (LOPRESSOR) 25 MG tablet Take 12.5 mg by mouth 2 (two) times daily.   . Multiple Vitamins-Minerals (CENTRUM SILVER PO) Take by mouth.  . rosuvastatin (CRESTOR) 5 MG tablet TAKE 1 TABLET BY MOUTH EVERY MONDAY, WEDNESDAY AND FRIDAY   No facility-administered encounter medications on file as of 04/10/2020.    Review of Systems  Constitutional: Negative for appetite change and unexpected weight change.  HENT: Negative for congestion and sinus pressure.   Respiratory: Negative for cough, chest tightness and shortness of breath.   Cardiovascular: Negative for chest pain, palpitations and leg swelling.  Gastrointestinal: Negative for abdominal pain, diarrhea, nausea and vomiting.  Genitourinary: Negative for difficulty urinating and dysuria.  Musculoskeletal: Negative for joint swelling and  myalgias.  Skin: Negative for color change and rash.  Neurological: Negative for dizziness, light-headedness and headaches.  Psychiatric/Behavioral: Negative for agitation and dysphoric mood.       Objective:    Physical Exam Vitals reviewed.  Constitutional:      General: She is not in acute distress.    Appearance: Normal appearance.  HENT:     Head: Normocephalic and atraumatic.     Right Ear: External ear normal.     Left Ear: External ear normal.  Eyes:     General: No scleral icterus.       Right eye: No discharge.        Left eye: No discharge.     Conjunctiva/sclera: Conjunctivae normal.  Neck:     Thyroid: No thyromegaly.  Cardiovascular:  Rate and Rhythm: Normal rate and regular rhythm.  Pulmonary:     Effort: No respiratory distress.     Breath sounds: Normal breath sounds. No wheezing.  Abdominal:     General: Bowel sounds are normal.     Palpations: Abdomen is soft.     Tenderness: There is no abdominal tenderness.  Musculoskeletal:        General: No swelling or tenderness.     Cervical back: Neck supple. No tenderness.  Lymphadenopathy:     Cervical: No cervical adenopathy.  Skin:    Findings: No erythema or rash.  Neurological:     Mental Status: She is alert.  Psychiatric:        Mood and Affect: Mood normal.        Behavior: Behavior normal.     BP 122/72   Pulse 83   Temp 97.7 F (36.5 C) (Oral)   Resp 16   Ht 5\' 2"  (1.575 m)   Wt 184 lb (83.5 kg)   SpO2 98%   BMI 33.65 kg/m  Wt Readings from Last 3 Encounters:  04/10/20 184 lb (83.5 kg)  03/13/20 184 lb (83.5 kg)  12/20/19 181 lb 12.8 oz (82.5 kg)     Lab Results  Component Value Date   WBC 7.0 11/24/2019   HGB 13.9 11/24/2019   HCT 41.8 11/24/2019   PLT 204.0 11/24/2019   GLUCOSE 92 04/10/2020   CHOL 147 04/10/2020   TRIG 147.0 04/10/2020   HDL 53.90 04/10/2020   LDLCALC 64 04/10/2020   ALT 15 04/10/2020   AST 17 04/10/2020   NA 137 04/10/2020   K 4.5 04/10/2020     CL 103 04/10/2020   CREATININE 1.04 04/10/2020   BUN 20 04/10/2020   CO2 25 04/10/2020   TSH 3.99 11/24/2019    MR BRAIN WO CONTRAST  Result Date: 12/29/2019 CLINICAL DATA:  Cognitive impairment. EXAM: MRI HEAD WITHOUT CONTRAST TECHNIQUE: Multiplanar, multiecho pulse sequences of the brain and surrounding structures were obtained without intravenous contrast. COMPARISON:  Brain MRI 03/22/2010. FINDINGS: Brain: Mild-to-moderate generalized parenchymal atrophy, progressed as compared to the MRI of 03/22/2010. Cerebral atrophy demonstrates no definite lobar predominance. There is no significant white matter disease for age. There is no acute infarct. No evidence of intracranial mass. No chronic intracranial blood products. No extra-axial fluid collection. No midline shift. Vascular: Expected proximal arterial flow voids. Skull and upper cervical spine: No focal marrow lesion. Sinuses/Orbits: Visualized orbits show no acute finding. No significant paranasal sinus disease or mastoid effusion at the imaged levels. IMPRESSION: No evidence of acute intracranial abnormality. Mild-to-moderate generalized parenchymal atrophy, progressed as compared to the MRI of 03/22/2010. Cerebral atrophy demonstrates no definite lobar predominance. Electronically Signed   By: Kellie Simmering DO   On: 12/29/2019 11:44       Assessment & Plan:   Problem List Items Addressed This Visit    Paroxysmal atrial fibrillation ([place])    Followed by cardiology. On eliquis.  Stable.        Hypercholesterolemia - Primary    On crestor.  Low cholesterol diet and exercise.  Follow lipid panel and liver function tests.        Relevant Orders   Hepatic function panel (Completed)   Lipid panel (Completed)   Basic metabolic panel (Completed)   Dementia ([place])    Seeing neurology and undergoing w/up.  Discussed assisted living.  Son very supportive.  Follow.       Bronchiectasis without acute exacerbation (  [place])    Had been  followed by pulmonary.  Would like f/u cxr.        Balance problem    Working with PT.        Aortic atherosclerosis ([facility])    Continue crestor.       Anemia    Follow cbc.           Einar Pheasant, MD

## 2020-04-11 ENCOUNTER — Other Ambulatory Visit: Payer: Self-pay | Admitting: Internal Medicine

## 2020-04-11 ENCOUNTER — Ambulatory Visit: Payer: Medicare Other

## 2020-04-11 DIAGNOSIS — R944 Abnormal results of kidney function studies: Secondary | ICD-10-CM

## 2020-04-11 NOTE — Progress Notes (Signed)
Order placed for f/u lab.   

## 2020-04-16 ENCOUNTER — Encounter: Payer: Self-pay | Admitting: Internal Medicine

## 2020-04-16 DIAGNOSIS — F0390 Unspecified dementia without behavioral disturbance: Secondary | ICD-10-CM | POA: Insufficient documentation

## 2020-04-16 NOTE — Assessment & Plan Note (Signed)
Follow cbc.  

## 2020-04-16 NOTE — Assessment & Plan Note (Signed)
Followed by cardiology.  On eliquis.  Stable.   

## 2020-04-16 NOTE — Assessment & Plan Note (Signed)
Continue crestor 

## 2020-04-16 NOTE — Assessment & Plan Note (Signed)
On crestor.  Low cholesterol diet and exercise.  Follow lipid panel and liver function tests.   

## 2020-04-16 NOTE — Assessment & Plan Note (Signed)
Seeing neurology and undergoing w/up.  Discussed assisted living.  Son very supportive.  Follow.

## 2020-04-16 NOTE — Assessment & Plan Note (Signed)
Had been followed by pulmonary.  Would like f/u cxr.

## 2020-04-16 NOTE — Assessment & Plan Note (Signed)
Working with PT.

## 2020-04-18 ENCOUNTER — Ambulatory Visit: Payer: Medicare Other

## 2020-04-18 ENCOUNTER — Other Ambulatory Visit: Payer: Self-pay

## 2020-04-18 DIAGNOSIS — R2681 Unsteadiness on feet: Secondary | ICD-10-CM

## 2020-04-18 DIAGNOSIS — R2689 Other abnormalities of gait and mobility: Secondary | ICD-10-CM

## 2020-04-18 NOTE — Therapy (Signed)
[place] MAIN [facility] SERVICES [address] [place], [place], [address] Phone: [phone]   Fax:  [phone]  Physical Therapy Treatment  Patient Details  Name: Bridget Briggs MRN: [id number] Date of Birth: November 13, 1930 Referring Provider (PT): Jennings Books (neurology)    Encounter Date: 04/18/2020   PT End of Session - 04/18/20 1304    Visit Number 14    Number of Visits 25    Date for PT Re-Evaluation 05/16/20    Authorization Type BCBS Medicare    Authorization Time Period Initial evaluation: 02/22/20, PN: 03/30/20    PT Start Time 1301    PT Stop Time 1341    PT Time Calculation (min) 40 min    Equipment Utilized During Treatment Gait belt    Activity Tolerance Patient tolerated treatment well;No increased pain    Behavior During Therapy WFL for tasks assessed/performed           Past Medical History:  Diagnosis Date  . Arthritis   . Depression   . GERD (gastroesophageal reflux disease)   . Glaucoma   . History of chicken pox   . History of colon polyps   . Hx: UTI (urinary tract infection)     Past Surgical History:  Procedure Laterality Date  . ABDOMINAL HYSTERECTOMY  1974   left ovary removed  . ANKLE SURGERY Left 1998  . APPENDECTOMY  1974  . BREAST BIOPSY Right 1988   NEG  . BREAST SURGERY Right 1988   biopsy  . CARDIAC CATHETERIZATION N/A 09/06/2015   Procedure: Right/Left Heart Cath and Coronary Angiography;  Surgeon: Yolonda Kida, MD;  Location: [facility];  Service: Cardiovascular;  Laterality: N/A;  . CHOLECYSTECTOMY  1996  . EYE SURGERY Bilateral 2005/2006   cataract  . LAPAROSCOPIC GASTRIC BANDING WITH HIATAL HERNIA REPAIR  2000  . NISSEN FUNDOPLICATION  1751  . OOPHORECTOMY Left   . TONSILLECTOMY AND ADENOIDECTOMY  1940    There were no vitals filed for this visit.   Subjective Assessment - 04/18/20 1303    Subjective Pt had a nice holiday, got to see her granddaughter who flew in  [place], [place]. Pt denies any updates since last session, no med changs, no falls.    Pertinent History Pt presents to OP PT for evaluation of unsteadiness on feet. Pt referred by Neurologist who sees her for Lewy Body Dementian diagnosis, new as of a few months ago. Son present during evaluation to provide history, reports 1 close-call wherein he provided physical assistance to  prevent fall. Son reports pt AMB with a shufle and appears to catch her toes on sudden chances in surface height.    Currently in Pain? No/denies              [facility] PT Assessment - 04/18/20 0001      Observation/Other Assessments   Focus on Therapeutic Outcomes (FOTO)  59/100           Ther-ex  -STS from chair into overhead press 5kg ball 2x10 -SLS 1x10 bilat alternating, hands free <3sec each before failure -SLS c CL toes on center of soft bosu 8x5secH bilat, hands free, minGuard assist -64f AMB HIIT, 5lb AW bilat, max speed; 29sec; 22sec; 21sec, 19sec *minguard assist, 60sec resting intervals between -AMB 1617fc YTB resistance at pelvis -AMB 32071f TYB facilitating trunk rotation, and postural extension -half foam roll lateral step overs, hands free 10x bilat, minGuard assist, cues for wide step.  PT Short Term Goals - 04/18/20 1312      PT SHORT TERM GOAL #1   Title After 4 weeks pt to demonstrate 10MWT >0.19ms    Baseline 0.522m at eval; 03/30/20: 0.67 m/s    Time 4    Period Weeks    Status Achieved    Target Date 03/21/20      PT SHORT TERM GOAL #2   Title After 4 weeks patient to demonstrate improved SLS balance to >5 sec bilat and supervision level assistance.    Baseline <3 seconds at minGuard assist at eval; 03/30/20: R: 2s, L: 1.5s; 11/30 <3sec bilat    Time 4    Period Weeks    Status Not Met    Target Date 03/21/20             PT Long Term Goals - 04/18/20 1316      PT LONG TERM GOAL #1   Title After 8 weeks pt to demonstrate FOTO score >71 to show improved functional  independence.    Baseline 64 at eval; 03/30/20: 45; 11/30: 59    Time 8    Period Weeks    Status On-going    Target Date 04/18/20      PT LONG TERM GOAL #2   Title After 8 weeks pt to demonstrate improved DGI to >21 to show decreased falls risk.    Baseline At eval: 18/24; 03/30/20: 19/24    Time 8    Period Weeks    Status Partially Met    Target Date 04/18/20      PT LONG TERM GOAL #3   Title After 12 weeks pt to demonstrate improved 6MWT to 128580fage-matched norm value for females 80-89yo)    Baseline 03/02/20: 975' no assistive device; 03/30/20: 1185' no assistive device    Time 12    Period Weeks    Status On-going    Target Date 05/16/20      PT LONG TERM GOAL #4   Title After 12 weeks pt to demonstrate 10MWT>1.7m/26mo show reduced falls risk from a gait standpoint.    Baseline 0.82m/3meval; 03/30/20: Self-selected: 14.9s = 0.67 m/s, fastest: 8.9s = 1.12 m/s    Time 12    Period Weeks    Status On-going    Target Date 05/16/20                 Plan - 04/18/20 1306    Clinical Impression Statement Continued with general strenghtening and overground gait training for purposes of improved strength in gait, dynamic balance, and postural control. Pt fatigues quickly with high intensity interventions, but recovers quickly seated. Improvement seen in FOTO score this date, but SLS remains limited in time. Son/pt updated on goals and timeline of therapy.    Personal Factors and Comorbidities Age;Behavior Pattern    Examination-Activity Limitations Squat;Stand;Stairs;Sit;Transfers    Examination-Participation Restrictions Cleaning;Yard Work;Community Activity    Stability/Clinical Decision Making Evolving/Moderate complexity    Clinical Decision Making Low    Rehab Potential Good    PT Frequency 2x / week    PT Duration 12 weeks    PT Treatment/Interventions Therapeutic exercise;Manual techniques;Moist Heat;Ultrasound;Electrical Stimulation;Gait training;Stair  training;Functional mobility training;Therapeutic activities;Balance training;Neuromuscular re-education;Patient/family education;Passive range of motion;Energy conservation;Orthotic Fit/Training;Joint Manipulations    PT Next Visit Plan Review HEP with patient, progress balance and strength, consider adding additional ankle strengthening    PT Home Exercise Plan Access Code: [email or web address]    Consulted and Agree with Plan  of Care Patient;Family member/caregiver    Family Member Consulted Son           Patient will benefit from skilled therapeutic intervention in order to improve the following deficits and impairments:  Abnormal gait, Decreased balance, Decreased range of motion, Improper body mechanics, Decreased activity tolerance, Decreased knowledge of use of DME, Decreased strength, Postural dysfunction  Visit Diagnosis: Unsteadiness on feet  Other abnormalities of gait and mobility     Problem List Patient Active Problem List   Diagnosis Date Noted  . Dementia ([place]) 04/16/2020  . Diaphoresis 03/13/2020  . Change in hearing 01/17/2020  . Balance problem 01/17/2020  . Change in vision 11/26/2019  . Hypercholesterolemia 07/01/2019  . Elevated TSH 07/02/2018  . Right hip pain 07/02/2018  . Sweating abnormality 12/28/2017  . Aortic atherosclerosis ([place]) 10/29/2016  . B12 deficiency 10/29/2015  . Anemia 08/28/2015  . Cough 06/28/2015  . Paroxysmal atrial fibrillation ([place]) 06/08/2015  . Left hip pain 04/05/2015  . Osteoporosis, post-menopausal 10/10/2014  . Pain of left thumb 08/06/2014  . Health care maintenance 08/06/2014  . Osteoporosis 07/28/2014  . Bronchiectasis without acute exacerbation ([place]) 01/20/2014  . Multiple pulmonary nodules 01/20/2014  . Abnormal CXR 11/02/2013  . Chest pain 11/01/2013  . Fatigue 11/01/2013  . Burning sensation in lower extremity 11/01/2013  . Environmental allergies 04/19/2013  . Arthritis 01/18/2013  . Depression 01/18/2013  . GERD  (gastroesophageal reflux disease) 01/18/2013  . Glaucoma 01/18/2013  . History of colonic polyps 01/18/2013  . Carotid stenosis 01/18/2013   1:47 PM, 04/18/20 Etta Grandchild, PT, DPT Physical Therapist - [place] [phone]     Kaiven Vester C 04/18/2020, 1:40 PM  [place] [facility] SERVICES [address] [place], [place], [address] Phone: [phone]   Fax:  [phone]  Name: MORENE CECILIO MRN: [id number] Date of Birth: [birth year]

## 2020-04-20 ENCOUNTER — Ambulatory Visit: Payer: Medicare Other

## 2020-04-25 ENCOUNTER — Other Ambulatory Visit (INDEPENDENT_AMBULATORY_CARE_PROVIDER_SITE_OTHER): Payer: Medicare Other

## 2020-04-25 ENCOUNTER — Ambulatory Visit: Payer: Medicare Other

## 2020-04-25 ENCOUNTER — Ambulatory Visit: Payer: Medicare Other | Attending: Neurology

## 2020-04-25 ENCOUNTER — Other Ambulatory Visit: Payer: Self-pay

## 2020-04-25 DIAGNOSIS — R2681 Unsteadiness on feet: Secondary | ICD-10-CM | POA: Diagnosis not present

## 2020-04-25 DIAGNOSIS — R944 Abnormal results of kidney function studies: Secondary | ICD-10-CM | POA: Diagnosis not present

## 2020-04-25 DIAGNOSIS — R2689 Other abnormalities of gait and mobility: Secondary | ICD-10-CM | POA: Insufficient documentation

## 2020-04-25 LAB — BASIC METABOLIC PANEL
BUN: 15 mg/dL (ref 6–23)
CO2: 26 mEq/L (ref 19–32)
Calcium: 9.4 mg/dL (ref 8.4–10.5)
Chloride: 105 mEq/L (ref 96–112)
Creatinine, Ser: 0.97 mg/dL (ref 0.40–1.20)
GFR: 52.64 mL/min — ABNORMAL LOW (ref >60.00)
Glucose, Bld: 77 mg/dL (ref 70–99)
Potassium: 4 mEq/L (ref 3.5–5.1)
Sodium: 137 mEq/L (ref 135–145)

## 2020-04-25 NOTE — Therapy (Signed)
[place] MAIN [facility] SERVICES [address] [place], [place], [address] Phone: [phone]   Fax:  [phone]  Physical Therapy Treatment  Patient Details  Name: Bridget Briggs MRN: [id number] Date of Birth: January 14, 1931 Referring Provider (PT): Jennings Books (neurology)    Encounter Date: 04/25/2020   PT End of Session - 04/25/20 1308    Visit Number 15    Number of Visits 25    Date for PT Re-Evaluation 05/16/20    Authorization Type BCBS Medicare    Authorization Time Period Initial evaluation: 02/22/20, PN: 03/30/20    PT Start Time 0103    PT Stop Time 0143    PT Time Calculation (min) 40 min    Equipment Utilized During Treatment Gait belt    Activity Tolerance Patient tolerated treatment well;No increased pain    Behavior During Therapy WFL for tasks assessed/performed           Past Medical History:  Diagnosis Date  . Arthritis   . Depression   . GERD (gastroesophageal reflux disease)   . Glaucoma   . History of chicken pox   . History of colon polyps   . Hx: UTI (urinary tract infection)     Past Surgical History:  Procedure Laterality Date  . ABDOMINAL HYSTERECTOMY  1974   left ovary removed  . ANKLE SURGERY Left 1998  . APPENDECTOMY  1974  . BREAST BIOPSY Right 1988   NEG  . BREAST SURGERY Right 1988   biopsy  . CARDIAC CATHETERIZATION N/A 09/06/2015   Procedure: Right/Left Heart Cath and Coronary Angiography;  Surgeon: Yolonda Kida, MD;  Location: [facility];  Service: Cardiovascular;  Laterality: N/A;  . CHOLECYSTECTOMY  1996  . EYE SURGERY Bilateral 2005/2006   cataract  . LAPAROSCOPIC GASTRIC BANDING WITH HIATAL HERNIA REPAIR  2000  . NISSEN FUNDOPLICATION  2992  . OOPHORECTOMY Left   . TONSILLECTOMY AND ADENOIDECTOMY  1940      Sit to stands x10  Subjective Assessment - 04/25/20 1307    Subjective The patient reports no LOB or falls.    Pertinent History Pt presents to OP PT for  evaluation of unsteadiness on feet. Pt referred by Neurologist who sees her for Lewy Body Dementian diagnosis, new as of a few months ago. Son present during evaluation to provide history, reports 1 close-call wherein he provided physical assistance to  prevent fall. Son reports pt AMB with a shufle and appears to catch her toes on sudden chances in surface height.    Currently in Pain? No/denies            There were no vitals filed for this visit.   Nu step L2 x 4 mins   CGA Airex WBOS Eyes open and eyes closed x 30s each Airex NBOS eyes open and eyes closed x 30s each Airex horizontal and vertical head turns x 3 sets of 30 seconds each  Airex tandem stance 30"x2 each Airex beam tandem walking x 3 laps  Bwd walk in parallel bars EO 2 laps, Fwd walk EC x2 laps   Seated marches with 3# x 20  Seated LAQ with 3# x 20 each leg Standing hip abduction, hip extension, HS curls with 3# x 20 reps each way Standing HR x20  PT Short Term Goals - 04/18/20 1312      PT SHORT TERM GOAL #1   Title After 4 weeks pt to demonstrate 10MWT >0.62ms    Baseline 0.523m at eval; 03/30/20: 0.67 m/s    Time 4    Period Weeks    Status Achieved    Target Date 03/21/20      PT SHORT TERM GOAL #2   Title After 4 weeks patient to demonstrate improved SLS balance to >5 sec bilat and supervision level assistance.    Baseline <3 seconds at minGuard assist at eval; 03/30/20: R: 2s, L: 1.5s; 11/30 <3sec bilat    Time 4    Period Weeks    Status Not Met    Target Date 03/21/20             PT Long Term Goals - 04/18/20 1316      PT LONG TERM GOAL #1   Title After 8 weeks pt to demonstrate FOTO score >71 to show improved functional independence.    Baseline 64 at eval; 03/30/20: 45; 11/30: 59    Time 8    Period Weeks    Status On-going    Target Date 04/18/20      PT LONG TERM GOAL #2   Title After 8 weeks pt to demonstrate  improved DGI to >21 to show decreased falls risk.    Baseline At eval: 18/24; 03/30/20: 19/24    Time 8    Period Weeks    Status Partially Met    Target Date 04/18/20      PT LONG TERM GOAL #3   Title After 12 weeks pt to demonstrate improved 6MWT to 12851fage-matched norm value for females 84-89yo)    Baseline 03/02/20: 975' no assistive device; 03/30/20: 1185' no assistive device    Time 12    Period Weeks    Status On-going    Target Date 05/16/20      PT LONG TERM GOAL #4   Title After 12 weeks pt to demonstrate 10MWT>1.59m/55mo show reduced falls risk from a gait standpoint.    Baseline 0.87m/42meval; 03/30/20: Self-selected: 14.9s = 0.67 m/s, fastest: 8.9s = 1.12 m/s    Time 12    Period Weeks    Status On-going    Target Date 05/16/20                 Plan - 04/25/20 1308    Clinical Impression Statement Patient presents today with good balance on level surfaces, appropriately challenged on airex pad with all activities requiring CGA.  The patient continues to benefit from skilled PT intervention to improve LE strength and balance to reduce fall risk and improve quality of life.    Personal Factors and Comorbidities Age;Behavior Pattern    Examination-Activity Limitations Squat;Stand;Stairs;Sit;Transfers    Examination-Participation Restrictions Cleaning;Yard Work;Community Activity    Stability/Clinical Decision Making Evolving/Moderate complexity    Rehab Potential Good    PT Frequency 2x / week    PT Duration 12 weeks    PT Treatment/Interventions Therapeutic exercise;Manual techniques;Moist Heat;Ultrasound;Electrical Stimulation;Gait training;Stair training;Functional mobility training;Therapeutic activities;Balance training;Neuromuscular re-education;Patient/family education;Passive range of motion;Energy conservation;Orthotic Fit/Training;Joint Manipulations    PT Next Visit Plan Review HEP with patient, progress balance and strength, consider adding  additional ankle strengthening    PT Home Exercise Plan Access Code: [email or web address]    Consulted and Agree with Plan of Care Patient;Family member/caregiver    Family Member Consulted Son  Patient will benefit from skilled therapeutic intervention in order to improve the following deficits and impairments:  Abnormal gait, Decreased balance, Decreased range of motion, Improper body mechanics, Decreased activity tolerance, Decreased knowledge of use of DME, Decreased strength, Postural dysfunction  Visit Diagnosis: Unsteadiness on feet  Other abnormalities of gait and mobility     Problem List Patient Active Problem List   Diagnosis Date Noted  . Dementia ([place]) 04/16/2020  . Diaphoresis 03/13/2020  . Change in hearing 01/17/2020  . Balance problem 01/17/2020  . Change in vision 11/26/2019  . Hypercholesterolemia 07/01/2019  . Elevated TSH 07/02/2018  . Right hip pain 07/02/2018  . Sweating abnormality 12/28/2017  . Aortic atherosclerosis ([place]) 10/29/2016  . B12 deficiency 10/29/2015  . Anemia 08/28/2015  . Cough 06/28/2015  . Paroxysmal atrial fibrillation ([place]) 06/08/2015  . Left hip pain 04/05/2015  . Osteoporosis, post-menopausal 10/10/2014  . Pain of left thumb 08/06/2014  . Health care maintenance 08/06/2014  . Osteoporosis 07/28/2014  . Bronchiectasis without acute exacerbation ([place]) 01/20/2014  . Multiple pulmonary nodules 01/20/2014  . Abnormal CXR 11/02/2013  . Chest pain 11/01/2013  . Fatigue 11/01/2013  . Burning sensation in lower extremity 11/01/2013  . Environmental allergies 04/19/2013  . Arthritis 01/18/2013  . Depression 01/18/2013  . GERD (gastroesophageal reflux disease) 01/18/2013  . Glaucoma 01/18/2013  . History of colonic polyps 01/18/2013  . Carotid stenosis 01/18/2013    Hal Morales, PT, DPT 04/25/2020, 1:46 PM   [facility] MAIN [facility] SERVICES [address] [place], [place], [address] Phone:  [phone]   Fax:  [phone]  Name: RYIAH BELLISSIMO MRN: [id number] Date of Birth: Oct 26, 1930

## 2020-04-27 ENCOUNTER — Other Ambulatory Visit: Payer: Self-pay

## 2020-04-27 ENCOUNTER — Ambulatory Visit: Payer: Medicare Other

## 2020-04-27 DIAGNOSIS — R2681 Unsteadiness on feet: Secondary | ICD-10-CM

## 2020-04-27 DIAGNOSIS — R2689 Other abnormalities of gait and mobility: Secondary | ICD-10-CM

## 2020-04-27 NOTE — Therapy (Signed)
[place] MAIN [facility] SERVICES [address] [place], [place], [address] Phone: [phone]   Fax:  [phone]  Physical Therapy Treatment  Patient Details  Name: Bridget Briggs MRN: [id number] Date of Birth: [birth year] Referring Provider (PT): Jennings Books (neurology)    Encounter Date: 04/27/2020   PT End of Session - 04/27/20 1259    Visit Number 16    Number of Visits 25    Date for PT Re-Evaluation 05/16/20    Authorization Type BCBS Medicare    Authorization Time Period Initial evaluation: 02/22/20, PN: 03/30/20    PT Start Time 0100    PT Stop Time 0143    PT Time Calculation (min) 43 min    Equipment Utilized During Treatment Gait belt    Activity Tolerance Patient tolerated treatment well;No increased pain    Behavior During Therapy WFL for tasks assessed/performed           Past Medical History:  Diagnosis Date  . Arthritis   . Depression   . GERD (gastroesophageal reflux disease)   . Glaucoma   . History of chicken pox   . History of colon polyps   . Hx: UTI (urinary tract infection)     Past Surgical History:  Procedure Laterality Date  . ABDOMINAL HYSTERECTOMY  1974   left ovary removed  . ANKLE SURGERY Left 1998  . APPENDECTOMY  1974  . BREAST BIOPSY Right 1988   NEG  . BREAST SURGERY Right 1988   biopsy  . CARDIAC CATHETERIZATION N/A 09/06/2015   Procedure: Right/Left Heart Cath and Coronary Angiography;  Surgeon: Yolonda Kida, MD;  Location: [place] [facility];  Service: Cardiovascular;  Laterality: N/A;  . CHOLECYSTECTOMY  1996  . EYE SURGERY Bilateral 2005/2006   cataract  . LAPAROSCOPIC GASTRIC BANDING WITH HIATAL HERNIA REPAIR  2000  . NISSEN FUNDOPLICATION  6948  . OOPHORECTOMY Left   . TONSILLECTOMY AND ADENOIDECTOMY  1940    There were no vitals filed for this visit.   Subjective Assessment - 04/27/20 1258    Subjective The patient reports doing well and has not noticed any balance  isues last couple of days.    Pertinent History Pt presents to OP PT for evaluation of unsteadiness on feet. Pt referred by Neurologist who sees her for Lewy Body Dementian diagnosis, new as of a few months ago. Son present during evaluation to provide history, reports 1 close-call wherein he provided physical assistance to  prevent fall. Son reports pt AMB with a shufle and appears to catch her toes on sudden chances in surface height.               Nu step L2 x 4 mins    CGA Airex WBOS Eyes open and eyes closed x 30s each Airex NBOS eyes open and eyes closed x 30s each Airex horizontal and vertical head turns x 3 sets of 30 seconds each  Airex tandem stance 30"x2 each Airex beam tandem walking x 3 laps (light UE use) Hurdles side steps     Seated marches with 3# x 20  Seated LAQ with 3# x 20 each leg Standing hip abduction, hip extension, HS curls with 3# x 20 reps each way Standing HR x20                           PT Short Term Goals - 04/18/20 1312      PT  SHORT TERM GOAL #1   Title After 4 weeks pt to demonstrate 10MWT >0.368ms    Baseline 0.517m at eval; 03/30/20: 0.67 m/s    Time 4    Period Weeks    Status Achieved    Target Date 03/21/20      PT SHORT TERM GOAL #2   Title After 4 weeks patient to demonstrate improved SLS balance to >5 sec bilat and supervision level assistance.    Baseline <3 seconds at minGuard assist at eval; 03/30/20: R: 2s, L: 1.5s; 11/30 <3sec bilat    Time 4    Period Weeks    Status Not Met    Target Date 03/21/20             PT Long Term Goals - 04/18/20 1316      PT LONG TERM GOAL #1   Title After 8 weeks pt to demonstrate FOTO score >71 to show improved functional independence.    Baseline 64 at eval; 03/30/20: 45; 11/30: 59    Time 8    Period Weeks    Status On-going    Target Date 04/18/20      PT LONG TERM GOAL #2   Title After 8 weeks pt to demonstrate improved DGI to >21 to show decreased  falls risk.    Baseline At eval: 18/24; 03/30/20: 19/24    Time 8    Period Weeks    Status Partially Met    Target Date 04/18/20      PT LONG TERM GOAL #3   Title After 12 weeks pt to demonstrate improved 6MWT to 128578fage-matched norm value for females 80-89yo)    Baseline 03/02/20: 975' no assistive device; 03/30/20: 1185' no assistive device    Time 12    Period Weeks    Status On-going    Target Date 05/16/20      PT LONG TERM GOAL #4   Title After 12 weeks pt to demonstrate 10MWT>1.68m/88mo show reduced falls risk from a gait standpoint.    Baseline 0.19m/7meval; 03/30/20: Self-selected: 14.9s = 0.67 m/s, fastest: 8.9s = 1.12 m/s    Time 12    Period Weeks    Status On-going    Target Date 05/16/20                 Plan - 04/27/20 1259    Clinical Impression Statement Patient greatly challenged with balance exercises on uneven surfaces requiring contact guard assist and intermitent use of UE support to maintain balance.  The patient continues to benefit from additional skilled PT intervention to improve LE strength and ability to stabilize on uneven surfaces for improved quality of life.    Personal Factors and Comorbidities Age;Behavior Pattern    Examination-Activity Limitations Squat;Stand;Stairs;Sit;Transfers    Examination-Participation Restrictions Cleaning;Yard Work;Community Activity    Stability/Clinical Decision Making Evolving/Moderate complexity    Rehab Potential Good    PT Frequency 2x / week    PT Duration 12 weeks    PT Treatment/Interventions Therapeutic exercise;Manual techniques;Moist Heat;Ultrasound;Electrical Stimulation;Gait training;Stair training;Functional mobility training;Therapeutic activities;Balance training;Neuromuscular re-education;Patient/family education;Passive range of motion;Energy conservation;Orthotic Fit/Training;Joint Manipulations    PT Next Visit Plan Review HEP with patient, progress balance and strength, consider adding  additional ankle strengthening    PT Home Exercise Plan Access Code: [email or web address]    Consulted and Agree with Plan of Care Patient;Family member/caregiver    Family Member Consulted Son           Patient will  benefit from skilled therapeutic intervention in order to improve the following deficits and impairments:  Abnormal gait,Decreased balance,Decreased range of motion,Improper body mechanics,Decreased activity tolerance,Decreased knowledge of use of DME,Decreased strength,Postural dysfunction  Visit Diagnosis: Other abnormalities of gait and mobility  Unsteadiness on feet     Problem List Patient Active Problem List   Diagnosis Date Noted  . Dementia ([place]) 04/16/2020  . Diaphoresis 03/13/2020  . Change in hearing 01/17/2020  . Balance problem 01/17/2020  . Change in vision 11/26/2019  . Hypercholesterolemia 07/01/2019  . Elevated TSH 07/02/2018  . Right hip pain 07/02/2018  . Sweating abnormality 12/28/2017  . Aortic atherosclerosis ([place]) 10/29/2016  . B12 deficiency 10/29/2015  . Anemia 08/28/2015  . Cough 06/28/2015  . Paroxysmal atrial fibrillation ([place]) 06/08/2015  . Left hip pain 04/05/2015  . Osteoporosis, post-menopausal 10/10/2014  . Pain of left thumb 08/06/2014  . Health care maintenance 08/06/2014  . Osteoporosis 07/28/2014  . Bronchiectasis without acute exacerbation ([place]) 01/20/2014  . Multiple pulmonary nodules 01/20/2014  . Abnormal CXR 11/02/2013  . Chest pain 11/01/2013  . Fatigue 11/01/2013  . Burning sensation in lower extremity 11/01/2013  . Environmental allergies 04/19/2013  . Arthritis 01/18/2013  . Depression 01/18/2013  . GERD (gastroesophageal reflux disease) 01/18/2013  . Glaucoma 01/18/2013  . History of colonic polyps 01/18/2013  . Carotid stenosis 01/18/2013    Hal Morales PT, DPT 04/27/2020, 1:46 PM  [place] [facility] MAIN [facility] SERVICES [address] [place], [place], [address] Phone: [phone]    Fax:  [phone]  Name: ANAHIS FURGESON MRN: [id number] Date of Birth: 02/23/1931

## 2020-05-02 ENCOUNTER — Ambulatory Visit: Payer: Medicare Other

## 2020-05-02 ENCOUNTER — Other Ambulatory Visit: Payer: Self-pay

## 2020-05-02 DIAGNOSIS — R2689 Other abnormalities of gait and mobility: Secondary | ICD-10-CM

## 2020-05-02 DIAGNOSIS — R2681 Unsteadiness on feet: Secondary | ICD-10-CM

## 2020-05-02 NOTE — Therapy (Addendum)
[place] MAIN [facility] SERVICES [address] [place], [place], [address] Phone: [phone]   Fax:  [phone]  Physical Therapy Treatment  Patient Details  Name: Bridget Briggs MRN: [id number] Date of Birth: [birth year] Referring Provider (PT): Jennings Books (neurology)    Encounter Date: 05/02/2020    Past Medical History:  Diagnosis Date  . Arthritis   . Depression   . GERD (gastroesophageal reflux disease)   . Glaucoma   . History of chicken pox   . History of colon polyps   . Hx: UTI (urinary tract infection)     Past Surgical History:  Procedure Laterality Date  . ABDOMINAL HYSTERECTOMY  1974   left ovary removed  . ANKLE SURGERY Left 1998  . APPENDECTOMY  1974  . BREAST BIOPSY Right 1988   NEG  . BREAST SURGERY Right 1988   biopsy  . CARDIAC CATHETERIZATION N/A 09/06/2015   Procedure: Right/Left Heart Cath and Coronary Angiography;  Surgeon: Yolonda Kida, MD;  Location: [place] [facility];  Service: Cardiovascular;  Laterality: N/A;  . CHOLECYSTECTOMY  1996  . EYE SURGERY Bilateral 2005/2006   cataract  . LAPAROSCOPIC GASTRIC BANDING WITH HIATAL HERNIA REPAIR  2000  . NISSEN FUNDOPLICATION  6073  . OOPHORECTOMY Left   . TONSILLECTOMY AND ADENOIDECTOMY  1940       Nu step L2 x 5 mins    CGA Airex NBOS eyes open and eyes closed x 30s each  Airex tandem stance 30"x2 each Airex beam tandem walking x 3 laps fwd/bwd(light UE use) Airex beam side stepping x3 laps (no UE support)      Seated marches with 3# x 20  Seated LAQ with 3# x 20 each leg Standing hip abduction, hip extension, HS curls with 3# x 20 reps each way Standing HR x20    Ambulating 257f with head turns and nods CGA                           PT Short Term Goals - 04/18/20 1312      PT SHORT TERM GOAL #1   Title After 4 weeks pt to demonstrate 10MWT >0.678m    Baseline 0.548mat eval; 03/30/20: 0.67 m/s    Time 4     Period Weeks    Status Achieved    Target Date 03/21/20      PT SHORT TERM GOAL #2   Title After 4 weeks patient to demonstrate improved SLS balance to >5 sec bilat and supervision level assistance.    Baseline <3 seconds at minGuard assist at eval; 03/30/20: R: 2s, L: 1.5s; 11/30 <3sec bilat    Time 4    Period Weeks    Status Not Met    Target Date 03/21/20             PT Long Term Goals - 05/04/20 1311      PT LONG TERM GOAL #1   Title After 8 weeks pt to demonstrate FOTO score >71 to show improved functional independence.    Baseline 64 at eval; 03/30/20: 45; 11/30: 59    Time 8    Period Weeks    Status On-going      PT LONG TERM GOAL #2   Title After 8 weeks pt to demonstrate improved DGI to >21 to show decreased falls risk.    Baseline At eval: 18/24; 03/30/20: 19/24    Time 8  Period Weeks    Status Partially Met      PT LONG TERM GOAL #3   Title After 12 weeks pt to demonstrate improved 6MWT to 1257f (age-matched norm value for females 80-89yo)    Baseline 03/02/20: 975' no assistive device; 03/30/20: 1185' no assistive device    Time 12    Period Weeks    Status On-going      PT LONG TERM GOAL #4   Title After 12 weeks pt to demonstrate 10MWT>1.037m to show reduced falls risk from a gait standpoint.    Baseline 0.5461m@ eval; 03/30/20: Self-selected: 14.9s = 0.67 m/s, fastest: 8.9s = 1.12 m/s, 05/04/20  .96 m/s    Time 12    Period Weeks    Status On-going                  Patient will benefit from skilled therapeutic intervention in order to improve the following deficits and impairments:  Abnormal gait,Decreased balance,Decreased range of motion,Improper body mechanics,Decreased activity tolerance,Decreased knowledge of use of DME,Decreased strength,Postural dysfunction  Visit Diagnosis: Other abnormalities of gait and mobility  Unsteadiness on feet     Problem List Patient Active Problem List   Diagnosis Date Noted  . Dementia  ([place]CC[place]1/28/2021  . Diaphoresis 03/13/2020  . Change in hearing 01/17/2020  . Balance problem 01/17/2020  . Change in vision 11/26/2019  . Hypercholesterolemia 07/01/2019  . Elevated TSH 07/02/2018  . Right hip pain 07/02/2018  . Sweating abnormality 12/28/2017  . Aortic atherosclerosis ([place]CC[place]6/04/2017  . B12 deficiency 10/29/2015  . Anemia 08/28/2015  . Cough 06/28/2015  . Paroxysmal atrial fibrillation ([place]CCNorth Midd[place]1/19/2017  . Left hip pain 04/05/2015  . Osteoporosis, post-menopausal 10/10/2014  . Pain of left thumb 08/06/2014  . Health care maintenance 08/06/2014  . Osteoporosis 07/28/2014  . Bronchiectasis without acute exacerbation ([place]CC[place]9/07/2013  . Multiple pulmonary nodules 01/20/2014  . Abnormal CXR 11/02/2013  . Chest pain 11/01/2013  . Fatigue 11/01/2013  . Burning sensation in lower extremity 11/01/2013  . Environmental allergies 04/19/2013  . Arthritis 01/18/2013  . Depression 01/18/2013  . GERD (gastroesophageal reflux disease) 01/18/2013  . Glaucoma 01/18/2013  . History of colonic polyps 01/18/2013  . Carotid stenosis 01/18/2013    BetHal Morales, DPT 05/04/2020, 1:52 PM  [place] [facility] SouthRVICES [address] [place]one: [phone]:  [phone]: PegSHADDAI SHAPLEYN: [phone] of Birth: 9/101/18/1932

## 2020-05-04 ENCOUNTER — Ambulatory Visit: Payer: Medicare Other

## 2020-05-04 ENCOUNTER — Other Ambulatory Visit: Payer: Self-pay

## 2020-05-04 DIAGNOSIS — R2689 Other abnormalities of gait and mobility: Secondary | ICD-10-CM

## 2020-05-04 DIAGNOSIS — R2681 Unsteadiness on feet: Secondary | ICD-10-CM

## 2020-05-04 NOTE — Therapy (Signed)
[place] MAIN [facility] SERVICES [address] [place], [place], [address] Phone: [phone]   Fax:  [phone]  Physical Therapy Treatment  Patient Details  Name: Bridget Briggs MRN: [id number] Date of Birth: 08-01-1930 Referring Provider (PT): Jennings Books (neurology)    Encounter Date: 05/04/2020   PT End of Session - 05/04/20 1258    Visit Number 18    Number of Visits 25    Date for PT Re-Evaluation 05/16/20    Authorization Type BCBS Medicare    Authorization Time Period Initial evaluation: 02/22/20, PN: 03/30/20    PT Start Time 0100    PT Stop Time 0144    PT Time Calculation (min) 44 min    Equipment Utilized During Treatment Gait belt    Activity Tolerance Patient tolerated treatment well;No increased pain    Behavior During Therapy WFL for tasks assessed/performed           Past Medical History:  Diagnosis Date   Arthritis    Depression    GERD (gastroesophageal reflux disease)    Glaucoma    History of chicken pox    History of colon polyps    Hx: UTI (urinary tract infection)     Past Surgical History:  Procedure Laterality Date   ABDOMINAL HYSTERECTOMY  1974   left ovary removed   ANKLE SURGERY Left 1998   APPENDECTOMY  1974   BREAST BIOPSY Right 1988   NEG   BREAST SURGERY Right 1988   biopsy   CARDIAC CATHETERIZATION N/A 09/06/2015   Procedure: Right/Left Heart Cath and Coronary Angiography;  Surgeon: Yolonda Kida, MD;  Location: [facility];  Service: Cardiovascular;  Laterality: N/A;   CHOLECYSTECTOMY  1996   EYE SURGERY Bilateral 2005/2006   cataract   LAPAROSCOPIC GASTRIC BANDING WITH HIATAL HERNIA REPAIR  6962   NISSEN FUNDOPLICATION  9528   OOPHORECTOMY Left    TONSILLECTOMY AND ADENOIDECTOMY  1940    There were no vitals filed for this visit.   Subjective Assessment - 05/04/20 1302    Subjective The patient states nothing new to report and that she seems to be  doing well.    Pertinent History Pt presents to OP PT for evaluation of unsteadiness on feet. Pt referred by Neurologist who sees her for Lewy Body Dementian diagnosis, new as of a few months ago. Son present during evaluation to provide history, reports 1 close-call wherein he provided physical assistance to  prevent fall. Son reports pt AMB with a shufle and appears to catch her toes on sudden chances in surface height.    Currently in Pain? No/denies               Nu step L2 x 5 mins    CGA Airex NBOS eyes open with perturbations and eyes closed static stance 30 sec each  Airex tandem stance 30"x2 each      Seated marches with 3# x 20  Seated LAQ with 3# x 20 each leg Standing hip abduction, hip extension, HS curls with 3# x 20 reps each way Standing HR x20    Semi-tandem walking 50 ft with head turns and nods CGA   SLS toe down 15"x2 each SLS 4 attempts each side unable to greater than 2 seconds without UE support Rocker board f/b, lat weight shifting light to no UE support x15 20 each  Sit to stands x10 cueing to slow lowering  PT Education - 05/04/20 1302    Education provided Yes    Education Details posture with balance activities    Person(s) Educated Patient    Methods Explanation    Comprehension Verbalized understanding            PT Short Term Goals - 04/18/20 1312      PT SHORT TERM GOAL #1   Title After 4 weeks pt to demonstrate 10MWT >0.75ms    Baseline 0.551m at eval; 03/30/20: 0.67 m/s    Time 4    Period Weeks    Status Achieved    Target Date 03/21/20      PT SHORT TERM GOAL #2   Title After 4 weeks patient to demonstrate improved SLS balance to >5 sec bilat and supervision level assistance.    Baseline <3 seconds at minGuard assist at eval; 03/30/20: R: 2s, L: 1.5s; 11/30 <3sec bilat    Time 4    Period Weeks    Status Not Met    Target Date 03/21/20             PT Long Term Goals - 05/04/20  1311      PT LONG TERM GOAL #1   Title After 8 weeks pt to demonstrate FOTO score >71 to show improved functional independence.    Baseline 64 at eval; 03/30/20: 45; 11/30: 59    Time 8    Period Weeks    Status On-going      PT LONG TERM GOAL #2   Title After 8 weeks pt to demonstrate improved DGI to >21 to show decreased falls risk.    Baseline At eval: 18/24; 03/30/20: 19/24    Time 8    Period Weeks    Status Partially Met      PT LONG TERM GOAL #3   Title After 12 weeks pt to demonstrate improved 6MWT to 128562fage-matched norm value for females 80-89yo)    Baseline 03/02/20: 975' no assistive device; 03/30/20: 1185' no assistive device    Time 12    Period Weeks    Status On-going      PT LONG TERM GOAL #4   Title After 12 weeks pt to demonstrate 10MWT>1.28m/28mo show reduced falls risk from a gait standpoint.    Baseline 0.68m/74meval; 03/30/20: Self-selected: 14.9s = 0.67 m/s, fastest: 8.9s = 1.12 m/s, 05/04/20  .96 m/s    Time 12    Period Weeks    Status On-going                 Plan - 05/04/20 1316    Clinical Impression Statement The patient demonstrates slight decrease in 128m w6mtest compared to previous assessment.  Overall speed has improved from .56 m/s to .96 m/s since initial assessment.  The patient continues to benefit from additional skilled PT intervention to further improve safety with gait and decreased risk of falls.    Personal Factors and Comorbidities Age;Behavior Pattern    Examination-Activity Limitations Squat;Stand;Stairs;Sit;Transfers    Examination-Participation Restrictions Cleaning;Yard Work;Community Activity    Stability/Clinical Decision Making Evolving/Moderate complexity    Rehab Potential Good    PT Frequency 2x / week    PT Duration 12 weeks    PT Treatment/Interventions Therapeutic exercise;Manual techniques;Moist Heat;Ultrasound;Electrical Stimulation;Gait training;Stair training;Functional mobility training;Therapeutic  activities;Balance training;Neuromuscular re-education;Patient/family education;Passive range of motion;Energy conservation;Orthotic Fit/Training;Joint Manipulations    PT Next Visit Plan Review HEP with patient, progress balance and strength, consider adding additional ankle strengthening  PT Home Exercise Plan Access Code: [email or web address]    Consulted and Agree with Plan of Care Patient;Family member/caregiver    Family Member Consulted Son           Patient will benefit from skilled therapeutic intervention in order to improve the following deficits and impairments:  Abnormal gait,Decreased balance,Decreased range of motion,Improper body mechanics,Decreased activity tolerance,Decreased knowledge of use of DME,Decreased strength,Postural dysfunction  Visit Diagnosis: Other abnormalities of gait and mobility  Unsteadiness on feet     Problem List Patient Active Problem List   Diagnosis Date Noted   Dementia ([place]) 04/16/2020   Diaphoresis 03/13/2020   Change in hearing 01/17/2020   Balance problem 01/17/2020   Change in vision 11/26/2019   Hypercholesterolemia 07/01/2019   Elevated TSH 07/02/2018   Right hip pain 07/02/2018   Sweating abnormality 12/28/2017   Aortic atherosclerosis ([place]) 10/29/2016   B12 deficiency 10/29/2015   Anemia 08/28/2015   Cough 06/28/2015   Paroxysmal atrial fibrillation ([place]) 06/08/2015   Left hip pain 04/05/2015   Osteoporosis, post-menopausal 10/10/2014   Pain of left thumb 08/06/2014   Health care maintenance 08/06/2014   Osteoporosis 07/28/2014   Bronchiectasis without acute exacerbation ([place]) 01/20/2014   Multiple pulmonary nodules 01/20/2014   Abnormal CXR 11/02/2013   Chest pain 11/01/2013   Fatigue 11/01/2013   Burning sensation in lower extremity 11/01/2013   Environmental allergies 04/19/2013   Arthritis 01/18/2013   Depression 01/18/2013   GERD (gastroesophageal reflux disease) 01/18/2013   Glaucoma  01/18/2013   History of colonic polyps 01/18/2013   Carotid stenosis 01/18/2013    Hal Morales PT, DPT 05/04/2020, 1:44 PM  [facility] MAIN [facility] SERVICES [place], [place], [address] Phone: [phone]   Fax:  [phone]  Name: MERLYN BOLLEN MRN: [id number] Date of Birth: [birth year]

## 2020-05-09 ENCOUNTER — Ambulatory Visit: Payer: Medicare Other

## 2020-05-09 ENCOUNTER — Other Ambulatory Visit: Payer: Self-pay

## 2020-05-09 DIAGNOSIS — R2689 Other abnormalities of gait and mobility: Secondary | ICD-10-CM | POA: Diagnosis not present

## 2020-05-09 DIAGNOSIS — R2681 Unsteadiness on feet: Secondary | ICD-10-CM

## 2020-05-09 NOTE — Therapy (Signed)
[place] MAIN [facility] SERVICES [address] [place], [place], [address] Phone: [phone]   Fax:  [phone]  Physical Therapy Treatment  Patient Details  Name: Bridget Briggs MRN: [id number] Date of Birth: December 01, 1930 Referring Provider (PT): Jennings Books (neurology)    Encounter Date: 05/09/2020   PT End of Session - 05/09/20 1255    Visit Number 19    Number of Visits 25    Date for PT Re-Evaluation 05/16/20    Authorization Type BCBS Medicare    Authorization Time Period Initial evaluation: 02/22/20, PN: 03/30/20    PT Start Time 0100    PT Stop Time 0145    PT Time Calculation (min) 45 min    Equipment Utilized During Treatment Gait belt    Activity Tolerance Patient tolerated treatment well;No increased pain    Behavior During Therapy WFL for tasks assessed/performed           Past Medical History:  Diagnosis Date  . Arthritis   . Depression   . GERD (gastroesophageal reflux disease)   . Glaucoma   . History of chicken pox   . History of colon polyps   . Hx: UTI (urinary tract infection)     Past Surgical History:  Procedure Laterality Date  . ABDOMINAL HYSTERECTOMY  1974   left ovary removed  . ANKLE SURGERY Left 1998  . APPENDECTOMY  1974  . BREAST BIOPSY Right 1988   NEG  . BREAST SURGERY Right 1988   biopsy  . CARDIAC CATHETERIZATION N/A 09/06/2015   Procedure: Right/Left Heart Cath and Coronary Angiography;  Surgeon: Yolonda Kida, MD;  Location: [facility];  Service: Cardiovascular;  Laterality: N/A;  . CHOLECYSTECTOMY  1996  . EYE SURGERY Bilateral 2005/2006   cataract  . LAPAROSCOPIC GASTRIC BANDING WITH HIATAL HERNIA REPAIR  2000  . NISSEN FUNDOPLICATION  1194  . OOPHORECTOMY Left   . TONSILLECTOMY AND ADENOIDECTOMY  1940    There were no vitals filed for this visit.   Subjective Assessment - 05/09/20 1306    Subjective Patient reports that her balance is seeming good.    Pertinent  History Pt presents to OP PT for evaluation of unsteadiness on feet. Pt referred by Neurologist who sees her for Lewy Body Dementian diagnosis, new as of a few months ago. Son present during evaluation to provide history, reports 1 close-call wherein he provided physical assistance to  prevent fall. Son reports pt AMB with a shufle and appears to catch her toes on sudden chances in surface height.    Currently in Pain? No/denies          Octane fitness 5 mins   Performed 6MWT and 10MWT   CGA Airex NBOS eyes open with perturbations and eyes closed static stance 30 sec each  Airex tandem stance 30"x2 each     Seated marches with 3# x 20  Seated LAQ with 3# x 20 each leg Standing hip abduction, hip extension, HS curls with 3# x 20 reps each way Standing HR x20    Semi-tandem walking 50 ft with head turns and nods CGA    SLS toe down 15"x2 each SLS 4 attempts each side unable to greater than 2 seconds without UE support Rocker board f/b, lat weight shifting light to no UE support  20 each Sit to stands x10 cueing to slow lowering  PT Education - 05/09/20 1307    Education provided Yes    Education Details balance, posture    Person(s) Educated Patient    Methods Explanation    Comprehension Verbalized understanding            PT Short Term Goals - 04/18/20 1312      PT SHORT TERM GOAL #1   Title After 4 weeks pt to demonstrate 10MWT >0.37ms    Baseline 0.573m at eval; 03/30/20: 0.67 m/s    Time 4    Period Weeks    Status Achieved    Target Date 03/21/20      PT SHORT TERM GOAL #2   Title After 4 weeks patient to demonstrate improved SLS balance to >5 sec bilat and supervision level assistance.    Baseline <3 seconds at minGuard assist at eval; 03/30/20: R: 2s, L: 1.5s; 11/30 <3sec bilat    Time 4    Period Weeks    Status Not Met    Target Date 03/21/20             PT Long Term Goals - 05/09/20 1323       PT LONG TERM GOAL #1   Title After 8 weeks pt to demonstrate FOTO score >71 to show improved functional independence.    Baseline 64 at eval; 03/30/20: 45; 11/30: 59    Time 8    Period Weeks    Status On-going      PT LONG TERM GOAL #2   Title After 8 weeks pt to demonstrate improved DGI to >21 to show decreased falls risk.    Baseline At eval: 18/24; 03/30/20: 19/24    Time 8    Period Weeks    Status Partially Met      PT LONG TERM GOAL #3   Title After 12 weeks pt to demonstrate improved 6MWT to 12856fage-matched norm value for females 80-89yo)    Baseline 03/02/20: 975' no assistive device; 03/30/20: 1185' no assistive device, 05/09/20 [id number] AD    Time 12    Period Weeks    Status On-going      PT LONG TERM GOAL #4   Title After 12 weeks pt to demonstrate 10MWT>1.3m/s14m show reduced falls risk from a gait standpoint.    Baseline 0.76m/s44mval; 03/30/20: Self-selected: 14.9s = 0.67 m/s, fastest: 8.9s = 1.12 m/s, 05/04/20  .96 m/s, 05/09/20  1.04 m/s    Time 12    Period Weeks    Status Achieved                 Plan - 05/09/20 1330    Clinical Impression Statement The patient demonstrates improved walking speed via 13m wa19mest and safe ambulation 950ft in57fins. Patient progressing well overal and continues to benefit from additional skilled PT services to improve balance and strength for decreased fall risk.    Personal Factors and Comorbidities Age;Behavior Pattern    Examination-Activity Limitations Squat;Stand;Stairs;Sit;Transfers    Examination-Participation Restrictions Cleaning;Yard Work;Community Activity    Stability/Clinical Decision Making Evolving/Moderate complexity    Rehab Potential Good    PT Frequency 2x / week    PT Duration 12 weeks    PT Treatment/Interventions Therapeutic exercise;Manual techniques;Moist Heat;Ultrasound;Electrical Stimulation;Gait training;Stair training;Functional mobility training;Therapeutic activities;Balance  training;Neuromuscular re-education;Patient/family education;Passive range of motion;Energy conservation;Orthotic Fit/Training;Joint Manipulations    PT Next Visit Plan Review HEP with patient, progress balance and strength, consider adding additional ankle strengthening    PT  Home Exercise Plan Access Code: [email or web address]    [email or web address] and Agree with Plan of Care Patient;Family member/caregiver    Family Member Consulted Son           Patient will benefit from skilled therapeutic intervention in order to improve the following deficits and impairments:  Abnormal gait,Decreased balance,Decreased range of motion,Improper body mechanics,Decreased activity tolerance,Decreased knowledge of use of DME,Decreased strength,Postural dysfunction  Visit Diagnosis: Other abnormalities of gait and mobility  Unsteadiness on feet     Problem List Patient Active Problem List   Diagnosis Date Noted  . Dementia ([place]) 04/16/2020  . Diaphoresis 03/13/2020  . Change in hearing 01/17/2020  . Balance problem 01/17/2020  . Change in vision 11/26/2019  . Hypercholesterolemia 07/01/2019  . Elevated TSH 07/02/2018  . Right hip pain 07/02/2018  . Sweating abnormality 12/28/2017  . Aortic atherosclerosis ([place]) 10/29/2016  . B12 deficiency 10/29/2015  . Anemia 08/28/2015  . Cough 06/28/2015  . Paroxysmal atrial fibrillation ([place]) 06/08/2015  . Left hip pain 04/05/2015  . Osteoporosis, post-menopausal 10/10/2014  . Pain of left thumb 08/06/2014  . Health care maintenance 08/06/2014  . Osteoporosis 07/28/2014  . Bronchiectasis without acute exacerbation ([place]) 01/20/2014  . Multiple pulmonary nodules 01/20/2014  . Abnormal CXR 11/02/2013  . Chest pain 11/01/2013  . Fatigue 11/01/2013  . Burning sensation in lower extremity 11/01/2013  . Environmental allergies 04/19/2013  . Arthritis 01/18/2013  . Depression 01/18/2013  . GERD (gastroesophageal reflux disease) 01/18/2013  . Glaucoma 01/18/2013  . History  of colonic polyps 01/18/2013  . Carotid stenosis 01/18/2013    Hal Morales PT, DPT 05/09/2020, 1:46 PM  [facility] MAIN [facility] SERVICES [address] [place], [place], [address] Phone: [phone]   Fax:  [phone]  Name: Bridget Briggs MRN: [id number] Date of Birth: Jan 30, 1931

## 2020-05-11 ENCOUNTER — Ambulatory Visit: Payer: Medicare Other

## 2020-05-11 ENCOUNTER — Other Ambulatory Visit: Payer: Self-pay

## 2020-05-11 DIAGNOSIS — R2681 Unsteadiness on feet: Secondary | ICD-10-CM | POA: Diagnosis not present

## 2020-05-11 DIAGNOSIS — R2689 Other abnormalities of gait and mobility: Secondary | ICD-10-CM

## 2020-05-11 NOTE — Therapy (Signed)
[place] MAIN [facility] SERVICES [address] [place], [place], [address] Phone: [phone]   Fax:  [phone]  Physical Therapy Treatment Physical Therapy Progress Note   Dates of reporting period  03/30/20   to   05/11/20   Patient Details  Name: Bridget Briggs MRN: [id number] Date of Birth: 1930/12/29 Referring Provider (PT): Jennings Books (neurology)    Encounter Date: 05/11/2020   PT End of Session - 05/11/20 1327    Visit Number 20    Number of Visits 25    Date for PT Re-Evaluation 05/16/20    Authorization Type BCBS Medicare    Authorization Time Period Initial evaluation: 02/22/20, PN: 03/30/20    PT Start Time 0103    PT Stop Time 0145    PT Time Calculation (min) 42 min    Equipment Utilized During Treatment Gait belt    Activity Tolerance Patient tolerated treatment well;No increased pain    Behavior During Therapy WFL for tasks assessed/performed           Past Medical History:  Diagnosis Date   Arthritis    Depression    GERD (gastroesophageal reflux disease)    Glaucoma    History of chicken pox    History of colon polyps    Hx: UTI (urinary tract infection)     Past Surgical History:  Procedure Laterality Date   ABDOMINAL HYSTERECTOMY  1974   left ovary removed   ANKLE SURGERY Left 1998   APPENDECTOMY  1974   BREAST BIOPSY Right 1988   NEG   BREAST SURGERY Right 1988   biopsy   CARDIAC CATHETERIZATION N/A 09/06/2015   Procedure: Right/Left Heart Cath and Coronary Angiography;  Surgeon: Yolonda Kida, MD;  Location: [facility];  Service: Cardiovascular;  Laterality: N/A;   CHOLECYSTECTOMY  1996   EYE SURGERY Bilateral 2005/2006   cataract   LAPAROSCOPIC GASTRIC BANDING WITH HIATAL HERNIA REPAIR  9381   NISSEN FUNDOPLICATION  0175   OOPHORECTOMY Left    TONSILLECTOMY AND ADENOIDECTOMY  1940    There were no vitals filed for this visit.   Subjective Assessment -  05/11/20 1307    Subjective The patient reports that she continues to be doing well has not been having major issues.    Pertinent History Pt presents to OP PT for evaluation of unsteadiness on feet. Pt referred by Neurologist who sees her for Lewy Body Dementian diagnosis, new as of a few months ago. Son present during evaluation to provide history, reports 1 close-call wherein he provided physical assistance to  prevent fall. Son reports pt AMB with a shufle and appears to catch her toes on sudden chances in surface height.    Currently in Pain? No/denies              [facility] PT Assessment - 05/11/20 0001      Standardized Balance Assessment   Standardized Balance Assessment Dynamic Gait Index      Dynamic Gait Index   Level Surface Mild Impairment    Change in Gait Speed Normal    Gait with Horizontal Head Turns Mild Impairment    Gait with Vertical Head Turns Normal    Gait and Pivot Turn Normal    Step Over Obstacle Mild Impairment    Step Around Obstacles Normal    Steps Mild Impairment    Total Score 20             Nu-step L2  6 mins   Stair climbing x4 bilateral UE support reciprocally 6 inch step ups x10 each f/l    Sit to stand x20 each with yellow ball  CGA needed for balance activities Airex Balance Beam fwd and side stepping 4 laps Airex Tandem balance 30"x1 each                    PT Short Term Goals - 04/18/20 1312      PT SHORT TERM GOAL #1   Title After 4 weeks pt to demonstrate 10MWT >0.39ms    Baseline 0.594m at eval; 03/30/20: 0.67 m/s    Time 4    Period Weeks    Status Achieved    Target Date 03/21/20      PT SHORT TERM GOAL #2   Title After 4 weeks patient to demonstrate improved SLS balance to >5 sec bilat and supervision level assistance.    Baseline <3 seconds at minGuard assist at eval; 03/30/20: R: 2s, L: 1.5s; 11/30 <3sec bilat    Time 4    Period Weeks    Status Not Met    Target Date 03/21/20              PT Long Term Goals - 05/11/20 1316      PT LONG TERM GOAL #1   Title After 8 weeks pt to demonstrate FOTO score >71 to show improved functional independence.    Baseline 64 at eval; 03/30/20: 45; 11/30: 59    Time 8    Period Weeks    Status On-going      PT LONG TERM GOAL #2   Title After 8 weeks pt to demonstrate improved DGI to >21 to show decreased falls risk.    Baseline At eval: 18/24; 03/30/20: 19/24, 05/11/20  20/24    Time 8    Period Weeks    Status Partially Met      PT LONG TERM GOAL #3   Title After 12 weeks pt to demonstrate improved 6MWT to 128575fage-matched norm value for females 80-89yo)    Baseline 03/02/20: 975' no assistive device; 03/30/20: 1185' no assistive device, 05/09/20 [id number] AD    Time 12    Period Weeks    Status On-going      PT LONG TERM GOAL #4   Title After 12 weeks pt to demonstrate 10MWT>1.39m/s48m show reduced falls risk from a gait standpoint.    Baseline 0.62m/s88mval; 03/30/20: Self-selected: 14.9s = 0.67 m/s, fastest: 8.9s = 1.12 m/s, 05/04/20  .96 m/s, 05/09/20  1.04 m/s    Time 12    Period Weeks    Status Achieved                 Plan - 05/11/20 1327    Clinical Impression Statement The patient made modest gains in DGI assessment however these gains have lowered her fall risk. The patient is progressing well overall as goals were updated this week. Patient's condition has the potential to improve in response to therapy. Maximum improvement is yet to be obtained. The anticipated improvement is attainable and reasonable in a generally predictable time.    Personal Factors and Comorbidities Age;Behavior Pattern    Examination-Activity Limitations Squat;Stand;Stairs;Sit;Transfers    Examination-Participation Restrictions Cleaning;Yard Work;Community Activity    Stability/Clinical Decision Making Evolving/Moderate complexity    Rehab Potential Good    PT Frequency 2x / week    PT Duration 12 weeks    PT Treatment/Interventions  Therapeutic exercise;Manual techniques;Moist Heat;Ultrasound;Electrical Stimulation;Gait training;Stair training;Functional mobility training;Therapeutic activities;Balance training;Neuromuscular re-education;Patient/family education;Passive range of motion;Energy conservation;Orthotic Fit/Training;Joint Manipulations    PT Next Visit Plan Review HEP with patient, progress balance and strength, consider adding additional ankle strengthening    PT Home Exercise Plan Access Code: [email or web address]    Consulted and Agree with Plan of Care Patient;Family member/caregiver    Family Member Consulted Son           Patient will benefit from skilled therapeutic intervention in order to improve the following deficits and impairments:  Abnormal gait,Decreased balance,Decreased range of motion,Improper body mechanics,Decreased activity tolerance,Decreased knowledge of use of DME,Decreased strength,Postural dysfunction  Visit Diagnosis: Other abnormalities of gait and mobility  Unsteadiness on feet     Problem List Patient Active Problem List   Diagnosis Date Noted   Dementia ([place]) 04/16/2020   Diaphoresis 03/13/2020   Change in hearing 01/17/2020   Balance problem 01/17/2020   Change in vision 11/26/2019   Hypercholesterolemia 07/01/2019   Elevated TSH 07/02/2018   Right hip pain 07/02/2018   Sweating abnormality 12/28/2017   Aortic atherosclerosis ([place]) 10/29/2016   B12 deficiency 10/29/2015   Anemia 08/28/2015   Cough 06/28/2015   Paroxysmal atrial fibrillation ([place]) 06/08/2015   Left hip pain 04/05/2015   Osteoporosis, post-menopausal 10/10/2014   Pain of left thumb 08/06/2014   Health care maintenance 08/06/2014   Osteoporosis 07/28/2014   Bronchiectasis without acute exacerbation ([place]) 01/20/2014   Multiple pulmonary nodules 01/20/2014   Abnormal CXR 11/02/2013   Chest pain 11/01/2013   Fatigue 11/01/2013   Burning sensation in lower extremity 11/01/2013    Environmental allergies 04/19/2013   Arthritis 01/18/2013   Depression 01/18/2013   GERD (gastroesophageal reflux disease) 01/18/2013   Glaucoma 01/18/2013   History of colonic polyps 01/18/2013   Carotid stenosis 01/18/2013    Hal Morales PT, DPT 05/11/2020, 2:35 PM  [place] MAIN [facility] SERVICES [address] [place], [place], [address] Phone: [phone]   Fax:  [phone]  Name: Bridget Briggs MRN: [id number] Date of Birth: [birth year]

## 2020-05-12 ENCOUNTER — Encounter: Payer: Self-pay | Admitting: Internal Medicine

## 2020-05-16 ENCOUNTER — Ambulatory Visit: Payer: Medicare Other

## 2020-05-16 ENCOUNTER — Other Ambulatory Visit: Payer: Self-pay

## 2020-05-16 DIAGNOSIS — R2681 Unsteadiness on feet: Secondary | ICD-10-CM | POA: Diagnosis not present

## 2020-05-16 DIAGNOSIS — R2689 Other abnormalities of gait and mobility: Secondary | ICD-10-CM

## 2020-05-16 NOTE — Therapy (Addendum)
[place] MAIN [facility] SERVICES [address] [place], [place], [address] Phone: [phone]   Fax:  [phone]  Physical Therapy Treatment  Patient Details  Name: Bridget Briggs MRN: [id number] Date of Birth: 1930/07/03 Referring Provider (PT): Jennings Books (neurology)    Encounter Date: 05/16/2020   PT End of Session - 07/12/20 1434    Visit Number 21    Number of Visits 25    Date for PT Re-Evaluation 05/16/20    Authorization Type BCBS Medicare    Authorization Time Period Initial evaluation: 02/22/20, PN: 03/30/20    PT Start Time 0100    PT Stop Time 0145    PT Time Calculation (min) 45 min    Equipment Utilized During Treatment Gait belt    Activity Tolerance Patient tolerated treatment well;No increased pain    Behavior During Therapy [facility] for tasks assessed/performed            PT End of Session - 07/12/20 1434    Visit Number 21    Number of Visits 25    Date for PT Re-Evaluation 05/16/20    Authorization Type BCBS Medicare    Authorization Time Period Initial evaluation: 02/22/20, PN: 03/30/20    PT Start Time 0100    PT Stop Time 0145    PT Time Calculation (min) 45 min    Equipment Utilized During Treatment Gait belt    Activity Tolerance Patient tolerated treatment well;No increased pain    Behavior During Therapy [facility] for tasks assessed/performed           PT End of Session - 07/12/20 1434    Visit Number 21    Number of Visits 25    Date for PT Re-Evaluation 05/16/20    Authorization Type BCBS Medicare    Authorization Time Period Initial evaluation: 02/22/20, PN: 03/30/20    PT Start Time 0100    PT Stop Time 0145    PT Time Calculation (min) 45 min    Equipment Utilized During Treatment Gait belt    Activity Tolerance Patient tolerated treatment well;No increased pain    Behavior During Therapy WFL for tasks assessed/performed            Past Medical History:  Diagnosis Date  . Arthritis   .  Depression   . GERD (gastroesophageal reflux disease)   . Glaucoma   . History of chicken pox   . History of colon polyps   . Hx: UTI (urinary tract infection)     Past Surgical History:  Procedure Laterality Date  . ABDOMINAL HYSTERECTOMY  1974   left ovary removed  . ANKLE SURGERY Left 1998  . APPENDECTOMY  1974  . BREAST BIOPSY Right 1988   NEG  . BREAST SURGERY Right 1988   biopsy  . CARDIAC CATHETERIZATION N/A 09/06/2015   Procedure: Right/Left Heart Cath and Coronary Angiography;  Surgeon: Yolonda Kida, MD;  Location: [place] [facility];  Service: Cardiovascular;  Laterality: N/A;  . CHOLECYSTECTOMY  1996  . EYE SURGERY Bilateral 2005/2006   cataract  . LAPAROSCOPIC GASTRIC BANDING WITH HIATAL HERNIA REPAIR  2000  . NISSEN FUNDOPLICATION  5883  . OOPHORECTOMY Left   . TONSILLECTOMY AND ADENOIDECTOMY  1940    There were no vitals filed for this visit.     Nu-step L2 6 mins   Stair climbing x4 bilateral UE support reciprocally 6 inch step ups x16 each f/l      Sit to  stand x10 on airex   CGA needed for balance activities Airex Balance Beam fwd and side stepping 3 laps Airex Tandem balance 30"x2 each Airex NBOS EC x1 min                          PT Short Term Goals - 04/18/20 1312      PT SHORT TERM GOAL #1   Title After 4 weeks pt to demonstrate 10MWT >0.31ms    Baseline 0.5103m at eval; 03/30/20: 0.67 m/s    Time 4    Period Weeks    Status Achieved    Target Date 03/21/20      PT SHORT TERM GOAL #2   Title After 4 weeks patient to demonstrate improved SLS balance to >5 sec bilat and supervision level assistance.    Baseline <3 seconds at minGuard assist at eval; 03/30/20: R: 2s, L: 1.5s; 11/30 <3sec bilat    Time 4    Period Weeks    Status Not Met    Target Date 03/21/20             PT Long Term Goals - 05/11/20 1316      PT LONG TERM GOAL #1   Title After 8 weeks pt to demonstrate FOTO score >71 to show  improved functional independence.    Baseline 64 at eval; 03/30/20: 45; 11/30: 59    Time 8    Period Weeks    Status On-going      PT LONG TERM GOAL #2   Title After 8 weeks pt to demonstrate improved DGI to >21 to show decreased falls risk.    Baseline At eval: 18/24; 03/30/20: 19/24, 05/11/20  20/24    Time 8    Period Weeks    Status Partially Met      PT LONG TERM GOAL #3   Title After 12 weeks pt to demonstrate improved 6MWT to [id number]-matched norm value for females 80-89yo)    Baseline 03/02/20: 975' no assistive device; 03/30/20: 1185' no assistive device, 05/09/20 [id number] AD    Time 12    Period Weeks    Status On-going      PT LONG TERM GOAL #4   Title After 12 weeks pt to demonstrate 10MWT>1.58m/s66m show reduced falls risk from a gait standpoint.    Baseline 0.54m/s84mval; 03/30/20: Self-selected: 14.9s = 0.67 m/s, fastest: 8.9s = 1.12 m/s, 05/04/20  .96 m/s, 05/09/20  1.04 m/s    Time 12    Period Weeks    Status Achieved                  Patient will benefit from skilled therapeutic intervention in order to improve the following deficits and impairments:  Abnormal gait,Decreased balance,Decreased range of motion,Improper body mechanics,Decreased activity tolerance,Decreased knowledge of use of DME,Decreased strength,Postural dysfunction  Visit Diagnosis: Other abnormalities of gait and mobility  Unsteadiness on feet     Problem List Patient Active Problem List   Diagnosis Date Noted  . Dementia (HCC) 1[place]8/2021  . Diaphoresis 03/13/2020  . Change in hearing 01/17/2020  . Balance problem 01/17/2020  . Change in vision 11/26/2019  . Hypercholesterolemia 07/01/2019  . Elevated TSH 07/02/2018  . Right hip pain 07/02/2018  . Sweating abnormality 12/28/2017  . Aortic atherosclerosis (HCC) 0[place]2/2018  . B12 deficiency 10/29/2015  . Anemia 08/28/2015  . Cough 06/28/2015  . Paroxysmal atrial fibrillation (HCC) 0[place]9/2017  . Left  hip pain  04/05/2015  . Osteoporosis, post-menopausal 10/10/2014  . Pain of left thumb 08/06/2014  . Health care maintenance 08/06/2014  . Osteoporosis 07/28/2014  . Bronchiectasis without acute exacerbation ([place]) 01/20/2014  . Multiple pulmonary nodules 01/20/2014  . Abnormal CXR 11/02/2013  . Chest pain 11/01/2013  . Fatigue 11/01/2013  . Burning sensation in lower extremity 11/01/2013  . Environmental allergies 04/19/2013  . Arthritis 01/18/2013  . Depression 01/18/2013  . GERD (gastroesophageal reflux disease) 01/18/2013  . Glaucoma 01/18/2013  . History of colonic polyps 01/18/2013  . Carotid stenosis 01/18/2013    Hal Morales PT, DPT 07/12/2020, 2:34 PM  [place] MAIN [facility] SERVICES [address] [place], [place], [address] Phone: [phone]   Fax:  [phone]  Name: JASHAE WIGGS MRN: [id number] Date of Birth: 04-05-1931

## 2020-05-17 ENCOUNTER — Ambulatory Visit (INDEPENDENT_AMBULATORY_CARE_PROVIDER_SITE_OTHER): Payer: Medicare Other

## 2020-05-17 VITALS — Ht 62.0 in | Wt 184.0 lb

## 2020-05-17 DIAGNOSIS — Z0000 Encounter for general adult medical examination without abnormal findings: Secondary | ICD-10-CM | POA: Diagnosis not present

## 2020-05-17 NOTE — Patient Instructions (Addendum)
Bridget Briggs , Thank you for taking time to come for your Medicare Wellness Visit. I appreciate your ongoing commitment to your health goals. Please review the following plan we discussed and let me know if I can assist you in the future.   These are the goals we discussed: Goals      Patient Stated     I would like to increase activity with classes at [facility] (pt-stated)       This is a list of the screening recommended for you and due dates:  Health Maintenance  Topic Date Due   Tetanus Vaccine  Never done   COVID-19 Vaccine (3 - Pfizer risk 4-dose series) 08/31/2019   Mammogram  05/24/2020   Flu Shot  Completed   DEXA scan (bone density measurement)  Completed   Pneumonia vaccines  Completed    Immunizations Immunization History  Administered Date(s) Administered   Fluad Quad(high Dose 65+) 02/19/2019, 06/29/2019, 02/29/2020   Influenza Split 02/22/2013   Influenza, High Dose Seasonal PF 06/10/2016, 02/27/2017   Influenza,inj,Quad PF,6+ Mos 01/20/2014, 03/02/2015, 02/13/2018   PFIZER SARS-COV-2 Vaccination 07/13/2019, 08/03/2019   Pneumococcal Conjugate-13 01/20/2014   Pneumococcal Polysaccharide-23 09/12/2016   Zoster Recombinat (Shingrix) 08/02/2018   Keep all routine maintenance appointments.   Follow up 07/14/19 @ 1:30  Advanced directives: End of life planning; Advance aging; Advanced directives discussed.  Copy of current HCPOA/Living Will requested.    Conditions/risks identified: none new  Follow up in one year for your annual wellness visit.   Preventive Care 11 Years and Older, Female Preventive care refers to lifestyle choices and visits with your health care provider that can promote health and wellness. What does preventive care include?  A yearly physical exam. This is also called an annual well check.  Dental exams once or twice a year.  Routine eye exams. Ask your health care provider how often you should have your eyes  checked.  Personal lifestyle choices, including:  Daily care of your teeth and gums.  Regular physical activity.  Eating a healthy diet.  Avoiding tobacco and drug use.  Limiting alcohol use.  Practicing safe sex.  Taking low-dose aspirin every day.  Taking vitamin and mineral supplements as recommended by your health care provider. What happens during an annual well check? The services and screenings done by your health care provider during your annual well check will depend on your age, overall health, lifestyle risk factors, and family history of disease. Counseling  Your health care provider may ask you questions about your:  Alcohol use.  Tobacco use.  Drug use.  Emotional well-being.  Home and relationship well-being.  Sexual activity.  Eating habits.  History of falls.  Memory and ability to understand (cognition).  Work and work Astronomer.  Reproductive health. Screening  You may have the following tests or measurements:  Height, weight, and BMI.  Blood pressure.  Lipid and cholesterol levels. These may be checked every 5 years, or more frequently if you are over 14 years old.  Skin check.  Lung cancer screening. You may have this screening every year starting at age 54 if you have a 30-pack-year history of smoking and currently smoke or have quit within the past 15 years.  Fecal occult blood test (FOBT) of the stool. You may have this test every year starting at age 78.  Flexible sigmoidoscopy or colonoscopy. You may have a sigmoidoscopy every 5 years or a colonoscopy every 10 years starting at age 65.  Hepatitis C  blood test.  Hepatitis B blood test.  Sexually transmitted disease (STD) testing.  Diabetes screening. This is done by checking your blood sugar (glucose) after you have not eaten for a while (fasting). You may have this done every 1-3 years.  Bone density scan. This is done to screen for osteoporosis. You may have this done  starting at age 70.  Mammogram. This may be done every 1-2 years. Talk to your health care provider about how often you should have regular mammograms. Talk with your health care provider about your test results, treatment options, and if necessary, the need for more tests. Vaccines  Your health care provider may recommend certain vaccines, such as:  Influenza vaccine. This is recommended every year.  Tetanus, diphtheria, and acellular pertussis (Tdap, Td) vaccine. You may need a Td booster every 10 years.  Zoster vaccine. You may need this after age 35.  Pneumococcal 13-valent conjugate (PCV13) vaccine. One dose is recommended after age 42.  Pneumococcal polysaccharide (PPSV23) vaccine. One dose is recommended after age 56. Talk to your health care provider about which screenings and vaccines you need and how often you need them. This information is not intended to replace advice given to you by your health care provider. Make sure you discuss any questions you have with your health care provider. Document Released: 06/02/2015 Document Revised: 01/24/2016 Document Reviewed: 03/07/2015 Elsevier Interactive Patient Education  2017 [employer].  Fall Prevention in the Home Falls can cause injuries. They can happen to people of all ages. There are many things you can do to make your home safe and to help prevent falls. What can I do on the outside of my home?  Regularly fix the edges of walkways and driveways and fix any cracks.  Remove anything that might make you trip as you walk through a door, such as a raised step or threshold.  Trim any bushes or trees on the path to your home.  Use bright outdoor lighting.  Clear any walking paths of anything that might make someone trip, such as rocks or tools.  Regularly check to see if handrails are loose or broken. Make sure that both sides of any steps have handrails.  Any raised decks and porches should have guardrails on the  edges.  Have any leaves, snow, or ice cleared regularly.  Use sand or salt on walking paths during winter.  Clean up any spills in your garage right away. This includes oil or grease spills. What can I do in the bathroom?  Use night lights.  Install grab bars by the toilet and in the tub and shower. Do not use towel bars as grab bars.  Use non-skid mats or decals in the tub or shower.  If you need to sit down in the shower, use a plastic, non-slip stool.  Keep the floor dry. Clean up any water that spills on the floor as soon as it happens.  Remove soap buildup in the tub or shower regularly.  Attach bath mats securely with double-sided non-slip rug tape.  Do not have throw rugs and other things on the floor that can make you trip. What can I do in the bedroom?  Use night lights.  Make sure that you have a light by your bed that is easy to reach.  Do not use any sheets or blankets that are too big for your bed. They should not hang down onto the floor.  Have a firm chair that has side arms. You  can use this for support while you get dressed.  Do not have throw rugs and other things on the floor that can make you trip. What can I do in the kitchen?  Clean up any spills right away.  Avoid walking on wet floors.  Keep items that you use a lot in easy-to-reach places.  If you need to reach something above you, use a strong step stool that has a grab bar.  Keep electrical cords out of the way.  Do not use floor polish or wax that makes floors slippery. If you must use wax, use non-skid floor wax.  Do not have throw rugs and other things on the floor that can make you trip. What can I do with my stairs?  Do not leave any items on the stairs.  Make sure that there are handrails on both sides of the stairs and use them. Fix handrails that are broken or loose. Make sure that handrails are as long as the stairways.  Check any carpeting to make sure that it is firmly  attached to the stairs. Fix any carpet that is loose or worn.  Avoid having throw rugs at the top or bottom of the stairs. If you do have throw rugs, attach them to the floor with carpet tape.  Make sure that you have a light switch at the top of the stairs and the bottom of the stairs. If you do not have them, ask someone to add them for you. What else can I do to help prevent falls?  Wear shoes that:  Do not have high heels.  Have rubber bottoms.  Are comfortable and fit you well.  Are closed at the toe. Do not wear sandals.  If you use a stepladder:  Make sure that it is fully opened. Do not climb a closed stepladder.  Make sure that both sides of the stepladder are locked into place.  Ask someone to hold it for you, if possible.  Clearly mark and make sure that you can see:  Any grab bars or handrails.  First and last steps.  Where the edge of each step is.  Use tools that help you move around (mobility aids) if they are needed. These include:  Canes.  Walkers.  Scooters.  Crutches.  Turn on the lights when you go into a dark area. Replace any light bulbs as soon as they burn out.  Set up your furniture so you have a clear path. Avoid moving your furniture around.  If any of your floors are uneven, fix them.  If there are any pets around you, be aware of where they are.  Review your medicines with your doctor. Some medicines can make you feel dizzy. This can increase your chance of falling. Ask your doctor what other things that you can do to help prevent falls. This information is not intended to replace advice given to you by your health care provider. Make sure you discuss any questions you have with your health care provider. Document Released: 03/02/2009 Document Revised: 10/12/2015 Document Reviewed: 06/10/2014 Elsevier Interactive Patient Education  2017 [employer].

## 2020-05-17 NOTE — Progress Notes (Addendum)
Subjective:   Bridget Briggs is a 86 y.o. female who presents for Medicare Annual (Subsequent) preventive examination.  Review of Systems    No ROS.  Medicare Wellness Virtual Visit.     Cardiac Risk Factors include: advanced age (>[id number], 54>65 women)     Objective:    Today's Vitals   05/17/20 1307  Weight: 184 lb (83.5 kg)   Body mass index is 33.65 kg/m.  Advanced Directives 05/17/2020 05/11/2019 05/07/2018 05/06/2017 04/25/2016 09/06/2015 06/08/2015  Does Patient Have a Medical Advance Directive? Yes Yes Yes Yes Yes Yes Yes  Type of Advance Directive - Living will Healthcare Power of [place];Living will Living will Healthcare Power of [place];Living will - Living will  Does patient want to make changes to medical advance directive? No - Patient declined No - Patient declined No - Patient declined No - Patient declined - No - Patient declined No - Patient declined  Copy of Healthcare Power of Attorney in Chart? - - Yes - validated most recent copy scanned in chart (See row information) - No - copy requested - No - copy requested    Current Medications (verified) Outpatient Encounter Medications as of 05/17/2020  Medication Sig   apixaban (ELIQUIS) 5 MG TABS tablet Take 1 tablet (5 mg total) by mouth 2 (two) times daily.   APOAEQUORIN PO Take by mouth.   beta carotene w/minerals (OCUVITE) tablet Take 1 tablet by mouth daily.   Blood Pressure Monitor MISC Use 1 each as directed   calcium-vitamin D (OSCAL WITH D) 500-200 MG-UNIT per tablet Take 1 tablet by mouth daily.   cholecalciferol (VITAMIN D3) 25 MCG (1000 UNIT) tablet Take 1,000 Units by mouth daily.    diphenhydramine-acetaminophen (TYLENOL PM) 25-500 MG TABS Take 1 tablet by mouth at bedtime as needed.   dorzolamide-timolol (COSOPT) 22.3-6.8 MG/ML ophthalmic solution Place 1 drop into both eyes 2 (two) times daily.   fluocinolone (SYNALAR) 0.01 % external solution Apply topically 2 (two) times daily as needed.    latanoprost (XALATAN) 0.005 % ophthalmic solution Place 1 drop into both eyes at bedtime.   metoprolol tartrate (LOPRESSOR) 25 MG tablet Take 12.5 mg by mouth 2 (two) times daily.    Multiple Vitamins-Minerals (CENTRUM SILVER PO) Take by mouth.   rosuvastatin (CRESTOR) 5 MG tablet TAKE 1 TABLET BY MOUTH EVERY MONDAY, WEDNESDAY AND FRIDAY   No facility-administered encounter medications on file as of 05/17/2020.    Allergies (verified) Penicillins   History: Past Medical History:  Diagnosis Date   Arthritis    Depression    GERD (gastroesophageal reflux disease)    Glaucoma    History of chicken pox    History of colon polyps    Hx: UTI (urinary tract infection)    Past Surgical History:  Procedure Laterality Date   ABDOMINAL HYSTERECTOMY  1974   left ovary removed   ANKLE SURGERY Left 1998   APPENDECTOMY  1974   BREAST BIOPSY Right 1988   NEG   BREAST SURGERY Right 1988   biopsy   CARDIAC CATHETERIZATION N/A 09/06/2015   Procedure: Right/Left Heart Cath and Coronary Angiography;  Surgeon: Alwyn Peawayne D Callwood, MD;  Location: [facility] INVASIVE CV LAB;  Service: Cardiovascular;  Laterality: N/A;   CHOLECYSTECTOMY  1996   EYE SURGERY Bilateral 2005/2006   cataract   LAPAROSCOPIC GASTRIC BANDING WITH HIATAL HERNIA REPAIR  2000   NISSEN FUNDOPLICATION  1996   OOPHORECTOMY Left    TONSILLECTOMY AND ADENOIDECTOMY  1940   Family  History  Problem Relation Age of Onset   Arthritis Mother    Leukemia Mother    Lung cancer Father    Osteoporosis Other    Breast cancer Neg Hx    Social History   Socioeconomic History   Marital status: Widowed    Spouse name: Not on file   Number of children: Not on file   Years of education: Not on file   Highest education level: Not on file  Occupational History   Not on file  Tobacco Use   Smoking status: Former Smoker    Packs/day: 1.00    Years: 30.00    Pack years: 30.00    Types: Cigarettes    Quit date: 05/20/1988    Years since  quitting: 32.0   Smokeless tobacco: Never Used  Vaping Use   Vaping Use: Never used  Substance and Sexual Activity   Alcohol use: No    Alcohol/week: 0.0 standard drinks   Drug use: No   Sexual activity: Never  Other Topics Concern   Not on file  Social History Narrative   Not on file   Social Determinants of Health   Financial Resource Strain: Low Risk    Difficulty of Paying Living Expenses: Not hard at all  Food Insecurity: No Food Insecurity   Worried About Charity fundraiser in the Last Year: Never true   [place] in the Last Year: Never true  Transportation Needs: No Transportation Needs   Lack of Transportation (Medical): No   Lack of Transportation (Non-Medical): No  Physical Activity: Sufficiently Active   Days of Exercise per Week: 5 days   Minutes of Exercise per Session: 30 min  Stress: No Stress Concern Present   Feeling of Stress : Not at all  Social Connections: Moderately Isolated   Frequency of Communication with Friends and Family: Once a week   Frequency of Social Gatherings with Friends and Family: Once a week   Attends Religious Services: 1 to 4 times per year   Active Member of [employer] or Organizations: Yes   Attends Archivist Meetings: 1 to 4 times per year   Marital Status: Widowed    Tobacco Counseling Counseling given: Not Answered   Clinical Intake:  Pre-visit preparation completed: Yes        Diabetes: No  How often do you need to have someone help you when you read instructions, pamphlets, or other written materials from your doctor or pharmacy?: 2 - Rarely  Interpreter Needed?: No      Activities of Daily Living In your present state of health, do you have any difficulty performing the following activities: 05/17/2020  Hearing? N  Vision? N  Difficulty concentrating or making decisions? N  Walking or climbing stairs? N  Dressing or bathing? N  Doing errands, shopping? N  Preparing Food and eating ? N   Using the Toilet? N  In the past six months, have you accidently leaked urine? N  Do you have problems with loss of bowel control? N  Managing your Medications? N  Managing your Finances? N  Housekeeping or managing your Housekeeping? N  Some recent data might be hidden    Patient Care Team: Einar Pheasant, MD as PCP - General (Internal Medicine)  Indicate any recent Medical Services you may have received from other than [facility] providers in the past year (date may be approximate).     Assessment:   This is a routine wellness examination for  Zoii.  I connected with Gimena today by telephone and verified that I am speaking with the correct person using two identifiers. Location patient: home Location provider: work Persons participating in the virtual visit: patient, Marine scientist.    I discussed the limitations, risks, security and privacy concerns of performing an evaluation and management service by telephone and the availability of in person appointments. The patient expressed understanding and verbally consented to this telephonic visit.    Interactive audio and video telecommunications were attempted between this provider and patient, however failed, due to patient having technical difficulties OR patient did not have access to video capability.  We continued and completed visit with audio only.  Some vital signs may be absent or patient reported.   Hearing/Vision screen  Hearing Screening   125Hz  250Hz  500Hz  1000Hz  2000Hz  3000Hz  4000Hz  6000Hz  8000Hz   Right ear:           Left ear:           Comments: Hearing aids  Vision Screening Comments: Virtual visit  Dietary issues and exercise activities discussed: Current Exercise Habits: Home exercise routine, Type of exercise: walking, Time (Minutes): 30, Frequency (Times/Week): 5, Weekly Exercise (Minutes/Week): 150, Intensity: Mild   Trying to follow the MIND diet Good water intake  Goals       Patient Stated     I would like  to increase activity with classes at [facility] (pt-stated)       Depression Screen PHQ 2/9 Scores 05/17/2020 05/11/2019 10/09/2018 05/07/2018 05/06/2017 10/28/2016 04/25/2016  PHQ - 2 Score 0 0 0 0 0 0 0  PHQ- 9 Score - - 0 - - - -    Fall Risk Fall Risk  05/17/2020 05/11/2019 02/19/2019 10/09/2018 05/07/2018  Falls in the past year? 0 0 0 0 0  Number falls in past yr: 0 - - 0 -  Injury with Fall? 0 - - 0 -  Follow up Falls evaluation completed Falls prevention discussed Falls evaluation completed Falls evaluation completed -    FALL RISK PREVENTION PERTAINING TO THE HOME: Handrails in use when climbing stairs? Yes Home free of loose throw rugs in walkways, pet beds, electrical cords, etc? Yes  Adequate lighting in your home to reduce risk of falls? Yes   ASSISTIVE DEVICES UTILIZED TO PREVENT FALLS: Use of a cane, walker or w/c? No   TIMED UP AND GO: Was the test performed? No . Virtual visit.   Cognitive Function: Patient is alert. Enjoys reading and word puzzles.  Followed by Neurology 03/06/20.  MMSE - Mini Mental State Exam 05/06/2017 04/26/2015  Orientation to time 5 5  Orientation to Place 5 5  Registration 3 3  Attention/ Calculation 5 5  Recall 3 3  Language- name 2 objects 2 2  Language- repeat 1 1  Language- follow 3 step command 3 3  Language- read & follow direction 1 1  Write a sentence 1 1  Copy design 1 1  Total score 30 30     6CIT Screen 05/11/2019 05/07/2018 04/25/2016  What Year? 0 points 0 points 0 points  What month? 0 points 0 points 0 points  What time? 0 points 0 points 0 points  Count back from 20 0 points 0 points 0 points  Months in reverse 0 points 0 points 0 points  Repeat phrase 4 points 0 points -  Total Score 4 0 -    Immunizations Immunization History  Administered Date(s) Administered   Fluad Quad(high Dose 65+)  02/19/2019, 06/29/2019, 02/29/2020   Influenza Split 02/22/2013   Influenza, High Dose Seasonal PF 06/10/2016, 02/27/2017    Influenza,inj,Quad PF,6+ Mos 01/20/2014, 03/02/2015, 02/13/2018   PFIZER SARS-COV-2 Vaccination 07/13/2019, 08/03/2019   Pneumococcal Conjugate-13 01/20/2014   Pneumococcal Polysaccharide-23 09/12/2016   Zoster Recombinat (Shingrix) 08/02/2018   Covid Booster- Not yet completed.   Health Maintenance Health Maintenance  Topic Date Due   TETANUS/TDAP  Never done   COVID-19 Vaccine (3 - Pfizer risk 4-dose series) 08/31/2019   MAMMOGRAM  05/24/2020   INFLUENZA VACCINE  Completed   DEXA SCAN  Completed   PNA vac Low Risk Adult  Completed   Colorectal cancer screening: No longer required.   Mammogram status: Completed 05/25/19. Repeat every year  Bone Density status: Completed 10/30/16. Results reflect: Bone density results: OSTEOPOROSIS. Repeat every 2 years. cholecalciferol (VITAMIN D3) 25 MCG (1000 UNIT) tablet. calcium-vitamin D (OSCAL WITH D) 500-200 MG-UNIT per tablet.  Lung Cancer Screening: (Low Dose CT Chest recommended if Age 83-80 years, 30 pack-year currently smoking OR have quit w/in 15years.) does not qualify.    Hepatitis C Screening: does not qualify.  Vision Screening: Recommended annual ophthalmology exams for early detection of glaucoma and other disorders of the eye. Is the patient up to date with their annual eye exam?  Yes  Who is the provider or what is the name of the office in which the patient attends annual eye exams? Dr. Alvester Morin.  Dental Screening: Recommended annual dental exams for proper oral hygiene. Visits every 12 months.   Community Resource Referral / Chronic Care Management: CRR required this visit?  No   CCM required this visit?  No      Plan:   Keep all routine maintenance appointments.   Follow up 07/14/19 @ 1:30  I have personally reviewed and noted the following in the patient's chart:   Medical and social history Use of alcohol, tobacco or illicit drugs  Current medications and supplements Functional ability and status Nutritional  status Physical activity Advanced directives List of other physicians Hospitalizations, surgeries, and ER visits in previous 12 months Vitals Screenings to include cognitive, depression, and falls Referrals and appointments  In addition, I have reviewed and discussed with patient certain preventive protocols, quality metrics, and best practice recommendations. A written personalized care plan for preventive services as well as general preventive health recommendations were provided to patient via mychart.      OBrien-Blaney, Laurel Smeltz L, LPN   80/88/1103    I have reviewed the above information and agree with above.   Duncan Dull, MD

## 2020-05-18 ENCOUNTER — Ambulatory Visit: Payer: Medicare Other

## 2020-07-04 DIAGNOSIS — H40153 Residual stage of open-angle glaucoma, bilateral: Secondary | ICD-10-CM | POA: Diagnosis not present

## 2020-07-13 ENCOUNTER — Encounter: Payer: Self-pay | Admitting: Internal Medicine

## 2020-07-13 ENCOUNTER — Other Ambulatory Visit: Payer: Self-pay

## 2020-07-13 ENCOUNTER — Ambulatory Visit: Payer: Medicare Other | Admitting: Internal Medicine

## 2020-07-13 ENCOUNTER — Other Ambulatory Visit: Payer: Self-pay | Admitting: Internal Medicine

## 2020-07-13 DIAGNOSIS — G319 Degenerative disease of nervous system, unspecified: Secondary | ICD-10-CM | POA: Diagnosis not present

## 2020-07-13 DIAGNOSIS — E7800 Pure hypercholesterolemia, unspecified: Secondary | ICD-10-CM

## 2020-07-13 DIAGNOSIS — J479 Bronchiectasis, uncomplicated: Secondary | ICD-10-CM

## 2020-07-13 DIAGNOSIS — I480 Paroxysmal atrial fibrillation: Secondary | ICD-10-CM | POA: Diagnosis not present

## 2020-07-13 DIAGNOSIS — I700 Atherosclerosis of aorta: Secondary | ICD-10-CM

## 2020-07-13 DIAGNOSIS — Z1231 Encounter for screening mammogram for malignant neoplasm of breast: Secondary | ICD-10-CM

## 2020-07-13 DIAGNOSIS — F0390 Unspecified dementia without behavioral disturbance: Secondary | ICD-10-CM

## 2020-07-13 NOTE — Progress Notes (Signed)
Patient ID: Bridget Briggs, female   DOB: [birth year], 87 y.o.   MRN: [id number]   Subjective:    Patient ID: Bridget Briggs, female    DOB: 03/26/1931, 87 y.o.   MRN: [id number]  HPI This visit occurred during the SARS-CoV-2 public health emergency.  Safety protocols were in place, including screening questions prior to the visit, additional usage of staff PPE, and extensive cleaning of exam room while observing appropriate contact time as indicated for disinfecting solutions.  Patient here for a scheduled follow up.  She is accompanied by her son.  History obtained from both of them.  She reports she is doing relatively well.  Able to get around her house.  No chest pain or sob reported.  No abdominal pain or bowel change reported.  Discussed memory.  Discussed driving.  Discussed assisted living.  Discussed looking into options for assisted living.  Information given.  Discussed formal driving evaluation.    Past Medical History:  Diagnosis Date  . Arthritis   . Depression   . GERD (gastroesophageal reflux disease)   . Glaucoma   . History of chicken pox   . History of colon polyps   . Hx: UTI (urinary tract infection)    Past Surgical History:  Procedure Laterality Date  . ABDOMINAL HYSTERECTOMY  1974   left ovary removed  . ANKLE SURGERY Left 1998  . APPENDECTOMY  1974  . BREAST BIOPSY Right 1988   NEG  . BREAST SURGERY Right 1988   biopsy  . CARDIAC CATHETERIZATION N/A 09/06/2015   Procedure: Right/Left Heart Cath and Coronary Angiography;  Surgeon: Yolonda Kida, MD;  Location: [facility];  Service: Cardiovascular;  Laterality: N/A;  . CHOLECYSTECTOMY  1996  . EYE SURGERY Bilateral 2005/2006   cataract  . LAPAROSCOPIC GASTRIC BANDING WITH HIATAL HERNIA REPAIR  2000  . NISSEN FUNDOPLICATION  6384  . OOPHORECTOMY Left   . TONSILLECTOMY AND ADENOIDECTOMY  1940   Family History  Problem Relation Age of Onset  . Arthritis Mother   . Leukemia Mother   . Lung  cancer Father   . Osteoporosis Other   . Breast cancer Neg Hx    Social History   Socioeconomic History  . Marital status: Widowed    Spouse name: Not on file  . Number of children: Not on file  . Years of education: Not on file  . Highest education level: Not on file  Occupational History  . Not on file  Tobacco Use  . Smoking status: Former Smoker    Packs/day: 1.00    Years: 30.00    Pack years: 30.00    Types: Cigarettes    Quit date: 05/20/1988    Years since quitting: 32.1  . Smokeless tobacco: Never Used  Vaping Use  . Vaping Use: Never used  Substance and Sexual Activity  . Alcohol use: No    Alcohol/week: 0.0 standard drinks  . Drug use: No  . Sexual activity: Never  Other Topics Concern  . Not on file  Social History Narrative  . Not on file   Social Determinants of Health   Financial Resource Strain: Low Risk   . Difficulty of Paying Living Expenses: Not hard at all  Food Insecurity: No Food Insecurity  . Worried About Charity fundraiser in the Last Year: Never true  . Ran Out of Food in the Last Year: Never true  Transportation Needs: No Transportation Needs  . Lack of Transportation (  Medical): No  . Lack of Transportation (Non-Medical): No  Physical Activity: Sufficiently Active  . Days of Exercise per Week: 5 days  . Minutes of Exercise per Session: 30 min  Stress: No Stress Concern Present  . Feeling of Stress : Not at all  Social Connections: Moderately Isolated  . Frequency of Communication with Friends and Family: Once a week  . Frequency of Social Gatherings with Friends and Family: Once a week  . Attends Religious Services: 1 to 4 times per year  . Active Member of Clubs or Organizations: Yes  . Attends Archivist Meetings: 1 to 4 times per year  . Marital Status: Widowed    Outpatient Encounter Medications as of 07/13/2020  Medication Sig  . apixaban (ELIQUIS) 5 MG TABS tablet Take 1 tablet (5 mg total) by mouth 2 (two) times  daily.  . APOAEQUORIN PO Take by mouth.  . diphenhydramine-acetaminophen (TYLENOL PM) 25-500 MG TABS Take 1 tablet by mouth at bedtime as needed.  . dorzolamide-timolol (COSOPT) 22.3-6.8 MG/ML ophthalmic solution Place 1 drop into both eyes 2 (two) times daily.  Marland Kitchen latanoprost (XALATAN) 0.005 % ophthalmic solution Place 1 drop into both eyes at bedtime.  . metoprolol tartrate (LOPRESSOR) 25 MG tablet Take 12.5 mg by mouth 2 (two) times daily.   . rosuvastatin (CRESTOR) 5 MG tablet TAKE 1 TABLET BY MOUTH EVERY MONDAY, WEDNESDAY AND FRIDAY  . [DISCONTINUED] beta carotene w/minerals (OCUVITE) tablet Take 1 tablet by mouth daily.  . [DISCONTINUED] Blood Pressure Monitor MISC Use 1 each as directed  . [DISCONTINUED] calcium-vitamin D (OSCAL WITH D) 500-200 MG-UNIT per tablet Take 1 tablet by mouth daily.  . [DISCONTINUED] cholecalciferol (VITAMIN D3) 25 MCG (1000 UNIT) tablet Take 1,000 Units by mouth daily.   . [DISCONTINUED] fluocinolone (SYNALAR) 0.01 % external solution Apply topically 2 (two) times daily as needed.  . [DISCONTINUED] Multiple Vitamins-Minerals (CENTRUM SILVER PO) Take by mouth.   No facility-administered encounter medications on file as of 07/13/2020.    Review of Systems  Constitutional: Negative for appetite change and unexpected weight change.  HENT: Negative for congestion and sinus pressure.   Respiratory: Negative for cough, chest tightness and shortness of breath.   Cardiovascular: Negative for chest pain, palpitations and leg swelling.  Gastrointestinal: Negative for abdominal pain, diarrhea, nausea and vomiting.  Genitourinary: Negative for difficulty urinating and dysuria.  Musculoskeletal: Negative for joint swelling and myalgias.  Skin: Negative for color change and rash.  Neurological: Negative for dizziness, light-headedness and headaches.  Psychiatric/Behavioral: Negative for agitation and dysphoric mood.       Objective:    Physical Exam Vitals  reviewed.  Constitutional:      General: She is not in acute distress.    Appearance: Normal appearance.  HENT:     Head: Normocephalic and atraumatic.     Right Ear: External ear normal.     Left Ear: External ear normal.     Mouth/Throat:     Mouth: Oropharynx is clear and moist.  Eyes:     General: No scleral icterus.       Right eye: No discharge.        Left eye: No discharge.     Conjunctiva/sclera: Conjunctivae normal.  Neck:     Thyroid: No thyromegaly.  Cardiovascular:     Rate and Rhythm: Normal rate and regular rhythm.  Pulmonary:     Effort: No respiratory distress.     Breath sounds: Normal breath sounds. No wheezing.  Abdominal:     General: Bowel sounds are normal.     Palpations: Abdomen is soft.     Tenderness: There is no abdominal tenderness.  Musculoskeletal:        General: No tenderness or edema.     Cervical back: Neck supple. No tenderness.  Lymphadenopathy:     Cervical: No cervical adenopathy.  Skin:    Findings: No erythema or rash.  Neurological:     Mental Status: She is alert.  Psychiatric:        Mood and Affect: Mood normal.        Behavior: Behavior normal.     BP 118/60   Pulse 86   Temp (!) 97.5 F (36.4 C) (Oral)   Resp 16   Ht 5\' 2"  (1.575 m)   Wt 186 lb 9.6 oz (84.6 kg)   SpO2 99%   BMI 34.13 kg/m  Wt Readings from Last 3 Encounters:  07/13/20 186 lb 9.6 oz (84.6 kg)  05/17/20 184 lb (83.5 kg)  04/10/20 184 lb (83.5 kg)     Lab Results  Component Value Date   WBC 7.0 11/24/2019   HGB 13.9 11/24/2019   HCT 41.8 11/24/2019   PLT 204.0 11/24/2019   GLUCOSE 77 04/25/2020   CHOL 147 04/10/2020   TRIG 147.0 04/10/2020   HDL 53.90 04/10/2020   LDLCALC 64 04/10/2020   ALT 15 04/10/2020   AST 17 04/10/2020   NA 137 04/25/2020   K 4.0 04/25/2020   CL 105 04/25/2020   CREATININE 0.97 04/25/2020   BUN 15 04/25/2020   CO2 26 04/25/2020   TSH 3.99 11/24/2019    MR BRAIN WO CONTRAST  Result Date:  12/29/2019 CLINICAL DATA:  Cognitive impairment. EXAM: MRI HEAD WITHOUT CONTRAST TECHNIQUE: Multiplanar, multiecho pulse sequences of the brain and surrounding structures were obtained without intravenous contrast. COMPARISON:  Brain MRI 03/22/2010. FINDINGS: Brain: Mild-to-moderate generalized parenchymal atrophy, progressed as compared to the MRI of 03/22/2010. Cerebral atrophy demonstrates no definite lobar predominance. There is no significant white matter disease for age. There is no acute infarct. No evidence of intracranial mass. No chronic intracranial blood products. No extra-axial fluid collection. No midline shift. Vascular: Expected proximal arterial flow voids. Skull and upper cervical spine: No focal marrow lesion. Sinuses/Orbits: Visualized orbits show no acute finding. No significant paranasal sinus disease or mastoid effusion at the imaged levels. IMPRESSION: No evidence of acute intracranial abnormality. Mild-to-moderate generalized parenchymal atrophy, progressed as compared to the MRI of 03/22/2010. Cerebral atrophy demonstrates no definite lobar predominance. Electronically Signed   By: Kellie Simmering DO   On: 12/29/2019 11:44       Assessment & Plan:   Problem List Items Addressed This Visit    Aortic atherosclerosis ([place])    Continue crestor.        Bronchiectasis without acute exacerbation (HCC)    Breathing stable.  Previously saw pulmonary.  Plan f/u cxr.       Cerebral atrophy ([place])    Found on recent MRI.  Stable.       Dementia [facility])    Has seen neurology.  Discussed today.  Discussed assisted living.  Information given.  Also discussed obtaining a formal driving evaluation.        Hypercholesterolemia    Continue crestor.  Low cholesterol diet and exercise.  Follow lipid panel and liver function tests.        Relevant Orders   Hepatic function panel   Lipid panel  Basic metabolic panel   Paroxysmal atrial fibrillation (HCC)    Continue eliquis.  Stable.             Einar Pheasant, MD

## 2020-07-15 ENCOUNTER — Encounter: Payer: Self-pay | Admitting: Internal Medicine

## 2020-07-15 DIAGNOSIS — G319 Degenerative disease of nervous system, unspecified: Secondary | ICD-10-CM | POA: Insufficient documentation

## 2020-07-15 NOTE — Assessment & Plan Note (Signed)
Has seen neurology.  Discussed today.  Discussed assisted living.  Information given.  Also discussed obtaining a formal driving evaluation.

## 2020-07-15 NOTE — Assessment & Plan Note (Signed)
Breathing stable.  Previously saw pulmonary.  Plan f/u cxr.

## 2020-07-15 NOTE — Assessment & Plan Note (Signed)
Found on recent MRI.  Stable.

## 2020-07-15 NOTE — Assessment & Plan Note (Signed)
Continue crestor.  Low cholesterol diet and exercise. Follow lipid panel and liver function tests.   

## 2020-07-15 NOTE — Assessment & Plan Note (Signed)
Continue crestor 

## 2020-07-15 NOTE — Assessment & Plan Note (Signed)
Continue eliquis.  Stable.

## 2020-08-01 ENCOUNTER — Ambulatory Visit
Admission: RE | Admit: 2020-08-01 | Discharge: 2020-08-01 | Disposition: A | Discharge status: Home / Self Care | Payer: Medicare Other | Source: Ambulatory Visit | Attending: Internal Medicine | Admitting: Internal Medicine

## 2020-08-01 ENCOUNTER — Other Ambulatory Visit: Payer: Self-pay

## 2020-08-01 DIAGNOSIS — Z1231 Encounter for screening mammogram for malignant neoplasm of breast: Secondary | ICD-10-CM | POA: Diagnosis not present

## 2020-08-02 ENCOUNTER — Encounter: Payer: Self-pay | Admitting: Internal Medicine

## 2020-08-07 DIAGNOSIS — G3183 Dementia with Lewy bodies: Secondary | ICD-10-CM | POA: Diagnosis not present

## 2020-08-07 DIAGNOSIS — F0280 Dementia in other diseases classified elsewhere without behavioral disturbance: Secondary | ICD-10-CM | POA: Diagnosis not present

## 2020-08-11 DIAGNOSIS — I480 Paroxysmal atrial fibrillation: Secondary | ICD-10-CM | POA: Diagnosis not present

## 2020-08-22 ENCOUNTER — Encounter: Payer: Self-pay | Admitting: Internal Medicine

## 2020-08-22 ENCOUNTER — Telehealth: Payer: Self-pay | Admitting: Internal Medicine

## 2020-08-22 NOTE — Telephone Encounter (Signed)
LMTCB

## 2020-08-22 NOTE — Telephone Encounter (Signed)
Patient's son dropped off papers from department of transportation for Dr. Nicki Reaper to complete. Papers are up front in color folder.

## 2020-08-22 NOTE — Telephone Encounter (Signed)
See my chart encounter.

## 2020-08-22 NOTE — Telephone Encounter (Signed)
She will need an evaluation for me to ok her driving.  I am not sure if I understand the message.

## 2020-08-22 NOTE — Telephone Encounter (Signed)
I had discussed with them that she needs a driving evaluation.  See previous note and my chart message.  See me if questions.

## 2020-08-22 NOTE — Telephone Encounter (Signed)
Spoke with son. He is hoping that this paperwork will replace the formal driving eval and they will not agree to renew her license. He says that the place that does the driving eval is booked out 4 weeks. This is due 4/18. He does not feel that she should continue to drive.

## 2020-08-23 NOTE — Telephone Encounter (Signed)
Spoke with son, he is agreeable with not filling out DMV packet. He has a couple phone calls in for driving evaluations so waiting to hear what they say. He is going to come pick DMV paperwork up when he is back in town.

## 2020-09-22 ENCOUNTER — Encounter: Payer: Self-pay | Admitting: Internal Medicine

## 2020-09-22 ENCOUNTER — Other Ambulatory Visit: Payer: Self-pay | Admitting: Internal Medicine

## 2020-09-22 MED ORDER — APIXABAN 5 MG PO TABS: 5.0000 mg | ORAL_TABLET | Freq: Two times a day (BID) | ORAL | 0 refills | Status: DC

## 2020-09-22 MED ORDER — ROSUVASTATIN CALCIUM 5 MG PO TABS: ORAL_TABLET | ORAL | 1 refills | Status: DC

## 2020-10-09 ENCOUNTER — Other Ambulatory Visit: Payer: Medicare Other

## 2020-10-10 ENCOUNTER — Ambulatory Visit
Admission: RE | Admit: 2020-10-10 | Discharge: 2020-10-10 | Disposition: A | Discharge status: Home / Self Care | Payer: Medicare Other | Source: Ambulatory Visit | Attending: Internal Medicine | Admitting: Internal Medicine

## 2020-10-10 ENCOUNTER — Ambulatory Visit: Payer: Medicare Other | Admitting: Internal Medicine

## 2020-10-10 ENCOUNTER — Other Ambulatory Visit: Payer: Self-pay

## 2020-10-10 ENCOUNTER — Other Ambulatory Visit (INDEPENDENT_AMBULATORY_CARE_PROVIDER_SITE_OTHER): Payer: Medicare Other

## 2020-10-10 VITALS — BP 118/68 | HR 83 | Temp 98.1°F | Resp 16 | Ht 62.0 in | Wt 181.8 lb

## 2020-10-10 DIAGNOSIS — D6869 Other thrombophilia: Secondary | ICD-10-CM

## 2020-10-10 DIAGNOSIS — R9389 Abnormal findings on diagnostic imaging of other specified body structures: Secondary | ICD-10-CM

## 2020-10-10 DIAGNOSIS — J479 Bronchiectasis, uncomplicated: Secondary | ICD-10-CM

## 2020-10-10 DIAGNOSIS — I480 Paroxysmal atrial fibrillation: Secondary | ICD-10-CM

## 2020-10-10 DIAGNOSIS — I517 Cardiomegaly: Secondary | ICD-10-CM | POA: Diagnosis not present

## 2020-10-10 DIAGNOSIS — E7800 Pure hypercholesterolemia, unspecified: Secondary | ICD-10-CM

## 2020-10-10 DIAGNOSIS — I700 Atherosclerosis of aorta: Secondary | ICD-10-CM

## 2020-10-10 DIAGNOSIS — D649 Anemia, unspecified: Secondary | ICD-10-CM

## 2020-10-10 DIAGNOSIS — F0390 Unspecified dementia without behavioral disturbance: Secondary | ICD-10-CM

## 2020-10-10 DIAGNOSIS — G319 Degenerative disease of nervous system, unspecified: Secondary | ICD-10-CM

## 2020-10-10 LAB — LIPID PANEL
Cholesterol: 167 mg/dL (ref 0–200)
HDL: 50.5 mg/dL (ref >39.00)
LDL Cholesterol: 93 mg/dL (ref 0–99)
NonHDL: 116.41
Total CHOL/HDL Ratio: 3
Triglycerides: 117 mg/dL (ref 0.0–149.0)
VLDL: 23.4 mg/dL (ref 0.0–40.0)

## 2020-10-10 LAB — HEPATIC FUNCTION PANEL
ALT: 11 U/L (ref 0–35)
AST: 15 U/L (ref 0–37)
Albumin: 4 g/dL (ref 3.5–5.2)
Alkaline Phosphatase: 53 U/L (ref 39–117)
Bilirubin, Direct: 0.2 mg/dL (ref 0.0–0.3)
Total Bilirubin: 1 mg/dL (ref 0.2–1.2)
Total Protein: 6.6 g/dL (ref 6.0–8.3)

## 2020-10-10 LAB — BASIC METABOLIC PANEL
BUN: 12 mg/dL (ref 6–23)
CO2: 25 mEq/L (ref 19–32)
Calcium: 9.8 mg/dL (ref 8.4–10.5)
Chloride: 107 mEq/L (ref 96–112)
Creatinine, Ser: 0.96 mg/dL (ref 0.40–1.20)
GFR: 53.13 mL/min — ABNORMAL LOW (ref >60.00)
Glucose, Bld: 96 mg/dL (ref 70–99)
Potassium: 4.1 mEq/L (ref 3.5–5.1)
Sodium: 141 mEq/L (ref 135–145)

## 2020-10-10 MED ORDER — APIXABAN 5 MG PO TABS: 5.0000 mg | ORAL_TABLET | Freq: Two times a day (BID) | ORAL | 5 refills | Status: DC

## 2020-10-10 MED ORDER — ROSUVASTATIN CALCIUM 5 MG PO TABS: 5.0000 mg | ORAL_TABLET | ORAL | 1 refills | Status: DC

## 2020-10-10 MED ORDER — ROSUVASTATIN CALCIUM 5 MG PO TABS: 5.0000 mg | ORAL_TABLET | Freq: Every day | ORAL | 1 refills | Status: DC

## 2020-10-10 NOTE — Progress Notes (Signed)
Patient ID: Bridget Briggs, female   DOB: 08-05-1930, 87 y.o.   MRN: [id number]   Subjective:    Patient ID: Bridget Briggs, female    DOB: June 24, 1930, 87 y.o.   MRN: [id number]  HPI This visit occurred during the SARS-CoV-2 public health emergency.  Safety protocols were in place, including screening questions prior to the visit, additional usage of staff PPE, and extensive cleaning of exam room while observing appropriate contact time as indicated for disinfecting solutions.  Patient here for a scheduled follow up.  She is accompanied by her son. History obtained from both of them.  Here to follow regarding her afib and cholesterol.  Seeing neurology.  Concern regarding lewy body dementia.  No longer driving.  Receiving meals on wheels.  Eating.  No nausea or vomiting reported.  No chest pain.  Sees cardiology regarding her afib.  Stable.  Breathing stable.  No increased cough or congestion.  No acid reflux or abdominal pain reported.  Bowels stable.  Per neurology - home health PT.    Past Medical History:  Diagnosis Date  . Arthritis   . Depression   . GERD (gastroesophageal reflux disease)   . Glaucoma   . History of chicken pox   . History of colon polyps   . Hx: UTI (urinary tract infection)    Past Surgical History:  Procedure Laterality Date  . ABDOMINAL HYSTERECTOMY  1974   left ovary removed  . ANKLE SURGERY Left 1998  . APPENDECTOMY  1974  . BREAST BIOPSY Right 1988   NEG  . BREAST SURGERY Right 1988   biopsy  . CARDIAC CATHETERIZATION N/A 09/06/2015   Procedure: Right/Left Heart Cath and Coronary Angiography;  Surgeon: Yolonda Kida, MD;  Location: [place] [facility];  Service: Cardiovascular;  Laterality: N/A;  . CHOLECYSTECTOMY  1996  . EYE SURGERY Bilateral 2005/2006   cataract  . LAPAROSCOPIC GASTRIC BANDING WITH HIATAL HERNIA REPAIR  2000  . NISSEN FUNDOPLICATION  5427  . OOPHORECTOMY Left   . TONSILLECTOMY AND ADENOIDECTOMY  1940   Family History   Problem Relation Age of Onset  . Arthritis Mother   . Leukemia Mother   . Lung cancer Father   . Osteoporosis Other   . Breast cancer Neg Hx    Social History   Socioeconomic History  . Marital status: Widowed    Spouse name: Not on file  . Number of children: Not on file  . Years of education: Not on file  . Highest education level: Not on file  Occupational History  . Not on file  Tobacco Use  . Smoking status: Former Smoker    Packs/day: 1.00    Years: 30.00    Pack years: 30.00    Types: Cigarettes    Quit date: 05/20/1988    Years since quitting: 32.4  . Smokeless tobacco: Never Used  Vaping Use  . Vaping Use: Never used  Substance and Sexual Activity  . Alcohol use: No    Alcohol/week: 0.0 standard drinks  . Drug use: No  . Sexual activity: Never  Other Topics Concern  . Not on file  Social History Narrative  . Not on file   Social Determinants of Health   Financial Resource Strain: Low Risk   . Difficulty of Paying Living Expenses: Not hard at all  Food Insecurity: No Food Insecurity  . Worried About Charity fundraiser in the Last Year: Never true  . Ran Out of Food  in the Last Year: Never true  Transportation Needs: No Transportation Needs  . Lack of Transportation (Medical): No  . Lack of Transportation (Non-Medical): No  Physical Activity: Sufficiently Active  . Days of Exercise per Week: 5 days  . Minutes of Exercise per Session: 30 min  Stress: No Stress Concern Present  . Feeling of Stress : Not at all  Social Connections: Moderately Isolated  . Frequency of Communication with Friends and Family: Once a week  . Frequency of Social Gatherings with Friends and Family: Once a week  . Attends Religious Services: 1 to 4 times per year  . Active Member of Clubs or Organizations: Yes  . Attends Archivist Meetings: 1 to 4 times per year  . Marital Status: Widowed    Outpatient Encounter Medications as of 10/10/2020  Medication Sig  .  apixaban (ELIQUIS) 5 MG TABS tablet Take 1 tablet (5 mg total) by mouth 2 (two) times daily.  . APOAEQUORIN PO Take by mouth.  . diphenhydramine-acetaminophen (TYLENOL PM) 25-500 MG TABS Take 1 tablet by mouth at bedtime as needed.  . dorzolamide-timolol (COSOPT) 22.3-6.8 MG/ML ophthalmic solution Place 1 drop into both eyes 2 (two) times daily.  Marland Kitchen latanoprost (XALATAN) 0.005 % ophthalmic solution Place 1 drop into both eyes at bedtime.  . metoprolol tartrate (LOPRESSOR) 25 MG tablet Take 12.5 mg by mouth 2 (two) times daily.   . rosuvastatin (CRESTOR) 5 MG tablet Take 1 tablet (5 mg total) by mouth daily.  . [DISCONTINUED] apixaban (ELIQUIS) 5 MG TABS tablet Take 1 tablet (5 mg total) by mouth 2 (two) times daily.  . [DISCONTINUED] rosuvastatin (CRESTOR) 5 MG tablet Take 1 tablet by mouth every Monday, Wednesday and Friday.  . [DISCONTINUED] rosuvastatin (CRESTOR) 5 MG tablet Take 1 tablet (5 mg total) by mouth every Monday, Wednesday, and Friday.   No facility-administered encounter medications on file as of 10/10/2020.    Review of Systems  Constitutional: Negative for appetite change and unexpected weight change.  HENT: Negative for congestion and sinus pressure.   Respiratory: Negative for cough, chest tightness and shortness of breath.   Cardiovascular: Negative for chest pain, palpitations and leg swelling.  Gastrointestinal: Negative for abdominal pain, diarrhea, nausea and vomiting.  Genitourinary: Negative for difficulty urinating and dysuria.  Musculoskeletal: Negative for joint swelling and myalgias.  Skin: Negative for color change and rash.  Neurological: Negative for dizziness, light-headedness and headaches.  Psychiatric/Behavioral: Negative for agitation and dysphoric mood.       Objective:    Physical Exam Vitals reviewed.  Constitutional:      General: She is not in acute distress.    Appearance: Normal appearance.  HENT:     Head: Normocephalic and atraumatic.      Right Ear: External ear normal.     Left Ear: External ear normal.  Eyes:     General: No scleral icterus.       Right eye: No discharge.        Left eye: No discharge.     Conjunctiva/sclera: Conjunctivae normal.  Neck:     Thyroid: No thyromegaly.  Cardiovascular:     Rate and Rhythm: Normal rate and regular rhythm.  Pulmonary:     Effort: No respiratory distress.     Breath sounds: Normal breath sounds. No wheezing.  Abdominal:     General: Bowel sounds are normal.     Palpations: Abdomen is soft.     Tenderness: There is no abdominal tenderness.  Musculoskeletal:        General: No swelling or tenderness.     Cervical back: Neck supple. No tenderness.  Lymphadenopathy:     Cervical: No cervical adenopathy.  Skin:    Findings: No erythema or rash.  Neurological:     Mental Status: She is alert.  Psychiatric:        Mood and Affect: Mood normal.        Behavior: Behavior normal.     BP 118/68   Pulse 83   Temp 98.1 F (36.7 C)   Resp 16   Ht 5\' 2"  (1.575 m)   Wt 181 lb 12.8 oz (82.5 kg)   SpO2 99%   BMI 33.25 kg/m  Wt Readings from Last 3 Encounters:  10/10/20 181 lb 12.8 oz (82.5 kg)  07/13/20 186 lb 9.6 oz (84.6 kg)  05/17/20 184 lb (83.5 kg)     Lab Results  Component Value Date   WBC 7.0 11/24/2019   HGB 13.9 11/24/2019   HCT 41.8 11/24/2019   PLT 204.0 11/24/2019   GLUCOSE 96 10/10/2020   CHOL 167 10/10/2020   TRIG 117.0 10/10/2020   HDL 50.50 10/10/2020   LDLCALC 93 10/10/2020   ALT 11 10/10/2020   AST 15 10/10/2020   NA 141 10/10/2020   K 4.1 10/10/2020   CL 107 10/10/2020   CREATININE 0.96 10/10/2020   BUN 12 10/10/2020   CO2 25 10/10/2020   TSH 3.99 11/24/2019    MM 3D SCREEN BREAST BILATERAL  Result Date: 08/02/2020 CLINICAL DATA:  Screening. EXAM: DIGITAL SCREENING BILATERAL MAMMOGRAM WITH TOMOSYNTHESIS AND CAD TECHNIQUE: Bilateral screening digital craniocaudal and mediolateral oblique mammograms were obtained. Bilateral  screening digital breast tomosynthesis was performed. The images were evaluated with computer-aided detection. COMPARISON:  Previous exam(s). ACR Breast Density Category b: There are scattered areas of fibroglandular density. FINDINGS: There are no findings suspicious for malignancy. The images were evaluated with computer-aided detection. IMPRESSION: No mammographic evidence of malignancy. A result letter of this screening mammogram will be mailed directly to the patient. RECOMMENDATION: Screening mammogram in one year. (Code:SM-B-01Y) BI-RADS CATEGORY  1: Negative. Electronically Signed   By: Franki Cabot M.D.   On: 08/02/2020 13:43       Assessment & Plan:   Problem List Items Addressed This Visit    Abnormal CXR - Primary    Breathing stable.  Will recheck cxr to confirm clear.       Relevant Orders   DG Chest 2 View (Completed)   Anemia    Follow cbc.       Aortic atherosclerosis (HCC)    Continue crestor.       Relevant Medications   apixaban (ELIQUIS) 5 MG TABS tablet   rosuvastatin (CRESTOR) 5 MG tablet   Bronchiectasis without acute exacerbation (HCC)    Previously saw pulmonary.  Breathing stable.  Will obtain f/u cxr.        Cerebral atrophy ([place])    Found on MRI. Seeing neurology.       Dementia [facility])    Seeing neurology for concern regarding lewy body dementia.  No longer driving.  Receiving meals on wheels.  Son very involved.  Follow.        Hypercholesterolemia    Continue crestor.  Low cholesterol diet and exercise.  Follow lipid panel and liver function tests.        Relevant Medications   apixaban (ELIQUIS) 5 MG TABS tablet   rosuvastatin (CRESTOR) 5 MG  tablet   Other thrombophilia ([place])    History of afib.  On eliquis.  Stable.       Paroxysmal atrial fibrillation (HCC)    Continue eliquis.  No increased heart rate or palpitations reported.  Follow.        Relevant Medications   apixaban (ELIQUIS) 5 MG TABS tablet   rosuvastatin (CRESTOR) 5 MG  tablet       Einar Pheasant, MD

## 2020-10-13 ENCOUNTER — Encounter: Payer: Self-pay | Admitting: Internal Medicine

## 2020-10-16 ENCOUNTER — Encounter: Payer: Self-pay | Admitting: Internal Medicine

## 2020-10-16 DIAGNOSIS — D6869 Other thrombophilia: Secondary | ICD-10-CM | POA: Insufficient documentation

## 2020-10-16 NOTE — Assessment & Plan Note (Signed)
Breathing stable.  Will recheck cxr to confirm clear.

## 2020-10-16 NOTE — Assessment & Plan Note (Signed)
Previously saw pulmonary.  Breathing stable.  Will obtain f/u cxr.

## 2020-10-16 NOTE — Assessment & Plan Note (Signed)
Seeing neurology for concern regarding lewy body dementia.  No longer driving.  Receiving meals on wheels.  Son very involved.  Follow.

## 2020-10-16 NOTE — Assessment & Plan Note (Signed)
Continue eliquis.  No increased heart rate or palpitations reported.  Follow.

## 2020-10-16 NOTE — Assessment & Plan Note (Signed)
History of afib.  On eliquis.  Stable.

## 2020-10-16 NOTE — Assessment & Plan Note (Signed)
Follow cbc.  

## 2020-10-16 NOTE — Assessment & Plan Note (Signed)
Continue crestor 

## 2020-10-16 NOTE — Assessment & Plan Note (Signed)
Continue crestor.  Low cholesterol diet and exercise. Follow lipid panel and liver function tests.   

## 2020-10-16 NOTE — Assessment & Plan Note (Signed)
Found on MRI. Seeing neurology.

## 2020-11-01 DIAGNOSIS — H40153 Residual stage of open-angle glaucoma, bilateral: Secondary | ICD-10-CM | POA: Diagnosis not present

## 2021-01-24 ENCOUNTER — Encounter: Payer: Medicare Other | Admitting: Internal Medicine

## 2021-01-31 ENCOUNTER — Other Ambulatory Visit: Payer: Self-pay

## 2021-01-31 ENCOUNTER — Ambulatory Visit (INDEPENDENT_AMBULATORY_CARE_PROVIDER_SITE_OTHER): Payer: Medicare Other | Admitting: Internal Medicine

## 2021-01-31 VITALS — BP 110/72 | HR 90 | Temp 97.8°F | Resp 16 | Ht 62.0 in | Wt 175.2 lb

## 2021-01-31 DIAGNOSIS — G319 Degenerative disease of nervous system, unspecified: Secondary | ICD-10-CM

## 2021-01-31 DIAGNOSIS — Z0000 Encounter for general adult medical examination without abnormal findings: Secondary | ICD-10-CM | POA: Diagnosis not present

## 2021-01-31 DIAGNOSIS — Z23 Encounter for immunization: Secondary | ICD-10-CM

## 2021-01-31 DIAGNOSIS — I480 Paroxysmal atrial fibrillation: Secondary | ICD-10-CM

## 2021-01-31 DIAGNOSIS — F0390 Unspecified dementia without behavioral disturbance: Secondary | ICD-10-CM

## 2021-01-31 DIAGNOSIS — D6869 Other thrombophilia: Secondary | ICD-10-CM

## 2021-01-31 DIAGNOSIS — D649 Anemia, unspecified: Secondary | ICD-10-CM | POA: Diagnosis not present

## 2021-01-31 DIAGNOSIS — E7800 Pure hypercholesterolemia, unspecified: Secondary | ICD-10-CM | POA: Diagnosis not present

## 2021-01-31 DIAGNOSIS — J479 Bronchiectasis, uncomplicated: Secondary | ICD-10-CM

## 2021-01-31 DIAGNOSIS — I700 Atherosclerosis of aorta: Secondary | ICD-10-CM

## 2021-01-31 LAB — HEPATIC FUNCTION PANEL
ALT: 17 U/L (ref 0–35)
AST: 18 U/L (ref 0–37)
Albumin: 4 g/dL (ref 3.5–5.2)
Alkaline Phosphatase: 50 U/L (ref 39–117)
Bilirubin, Direct: 0.3 mg/dL (ref 0.0–0.3)
Total Bilirubin: 1.4 mg/dL — ABNORMAL HIGH (ref 0.2–1.2)
Total Protein: 6.3 g/dL (ref 6.0–8.3)

## 2021-01-31 LAB — CBC WITH DIFFERENTIAL/PLATELET
Basophils Absolute: 0 10*3/uL (ref 0.0–0.1)
Basophils Relative: 0.7 % (ref 0.0–3.0)
Eosinophils Absolute: 0 10*3/uL (ref 0.0–0.7)
Eosinophils Relative: 0.6 % (ref 0.0–5.0)
HCT: 43 % (ref 36.0–46.0)
Hemoglobin: 13.9 g/dL (ref 12.0–15.0)
Lymphocytes Relative: 25 % (ref 12.0–46.0)
Lymphs Abs: 1.7 10*3/uL (ref 0.7–4.0)
MCHC: 32.4 g/dL (ref 30.0–36.0)
MCV: 88.9 fl (ref 78.0–100.0)
Monocytes Absolute: 0.6 10*3/uL (ref 0.1–1.0)
Monocytes Relative: 9.3 % (ref 3.0–12.0)
Neutro Abs: 4.5 10*3/uL (ref 1.4–7.7)
Neutrophils Relative %: 64.4 % (ref 43.0–77.0)
Platelets: 152 10*3/uL (ref 150.0–400.0)
RBC: 4.84 Mil/uL (ref 3.87–5.11)
RDW: 14.9 % (ref 11.5–15.5)
WBC: 6.9 10*3/uL (ref 4.0–10.5)

## 2021-01-31 LAB — LIPID PANEL
Cholesterol: 122 mg/dL (ref 0–200)
HDL: 54.9 mg/dL (ref >39.00)
LDL Cholesterol: 43 mg/dL (ref 0–99)
NonHDL: 66.66
Total CHOL/HDL Ratio: 2
Triglycerides: 116 mg/dL (ref 0.0–149.0)
VLDL: 23.2 mg/dL (ref 0.0–40.0)

## 2021-01-31 LAB — BASIC METABOLIC PANEL
BUN: 12 mg/dL (ref 6–23)
CO2: 27 mEq/L (ref 19–32)
Calcium: 9.8 mg/dL (ref 8.4–10.5)
Chloride: 105 mEq/L (ref 96–112)
Creatinine, Ser: 0.86 mg/dL (ref 0.40–1.20)
GFR: 60.49 mL/min (ref >60.00)
Glucose, Bld: 87 mg/dL (ref 70–99)
Potassium: 4.1 mEq/L (ref 3.5–5.1)
Sodium: 139 mEq/L (ref 135–145)

## 2021-01-31 LAB — TSH: TSH: 2.73 u[IU]/mL (ref 0.35–5.50)

## 2021-01-31 NOTE — Progress Notes (Signed)
Patient ID: Bridget Briggs, female   DOB: 1930/12/22, 87 y.o.   MRN: [id number]   Subjective:    Patient ID: Bridget Briggs, female    DOB: 05-14-1931, 87 y.o.   MRN: [id number]  This visit occurred during the SARS-CoV-2 public health emergency.  Safety protocols were in place, including screening questions prior to the visit, additional usage of staff PPE, and extensive cleaning of exam room while observing appropriate contact time as indicated for disinfecting solutions.   Patient here for her physical exam.   Chief Complaint  Patient presents with   Annual Exam   .   HPI She is accompanied by her son. History obtained from both of them.  Appears things are stable.  She is able to get around her house and perform ADLs.  No chest pain or sob reported.  No abdominal pain or bowel change reported.  Weight is down some.  She is eating.  Son very involved and checks on her regularly.     Past Medical History:  Diagnosis Date   Arthritis    Depression    GERD (gastroesophageal reflux disease)    Glaucoma    History of chicken pox    History of colon polyps    Hx: UTI (urinary tract infection)    Past Surgical History:  Procedure Laterality Date   ABDOMINAL HYSTERECTOMY  1974   left ovary removed   ANKLE SURGERY Left 1998   APPENDECTOMY  1974   BREAST BIOPSY Right 1988   NEG   BREAST SURGERY Right 1988   biopsy   CARDIAC CATHETERIZATION N/A 09/06/2015   Procedure: Right/Left Heart Cath and Coronary Angiography;  Surgeon: Yolonda Kida, MD;  Location: [place] [facility];  Service: Cardiovascular;  Laterality: N/A;   CHOLECYSTECTOMY  1996   EYE SURGERY Bilateral 2005/2006   cataract   LAPAROSCOPIC GASTRIC BANDING WITH HIATAL HERNIA REPAIR  [id number]   NISSEN FUNDOPLICATION  [id number]   OOPHORECTOMY Left    TONSILLECTOMY AND ADENOIDECTOMY  1940   Family History  Problem Relation Age of Onset   Arthritis Mother    Leukemia Mother    Lung cancer Father    Osteoporosis Other     Breast cancer Neg Hx    Social History   Socioeconomic History   Marital status: Widowed    Spouse name: Not on file   Number of children: Not on file   Years of education: Not on file   Highest education level: Not on file  Occupational History   Not on file  Tobacco Use   Smoking status: Former    Packs/day: 1.00    Years: 30.00    Pack years: 30.00    Types: Cigarettes    Quit date: 05/20/1988    Years since quitting: 32.7   Smokeless tobacco: Never  Vaping Use   Vaping Use: Never used  Substance and Sexual Activity   Alcohol use: No    Alcohol/week: 0.0 standard drinks   Drug use: No   Sexual activity: Never  Other Topics Concern   Not on file  Social History Narrative   Not on file   Social Determinants of Health   Financial Resource Strain: Low Risk    Difficulty of Paying Living Expenses: Not hard at all  Food Insecurity: No Food Insecurity   Worried About Charity fundraiser in the Last Year: Never true   [place] in the Last Year: Never true  Transportation Needs:  No Transportation Needs   Lack of Transportation (Medical): No   Lack of Transportation (Non-Medical): No  Physical Activity: Sufficiently Active   Days of Exercise per Week: 5 days   Minutes of Exercise per Session: 30 min  Stress: No Stress Concern Present   Feeling of Stress : Not at all  Social Connections: Moderately Isolated   Frequency of Communication with Friends and Family: Once a week   Frequency of Social Gatherings with Friends and Family: Once a week   Attends Religious Services: 1 to 4 times per year   Active Member of [employer] or Organizations: Yes   Attends Archivist Meetings: 1 to 4 times per year   Marital Status: Widowed    Review of Systems  Constitutional:  Negative for appetite change and fever.  HENT:  Negative for congestion, sinus pressure and sore throat.   Eyes:  Negative for pain and visual disturbance.  Respiratory:  Negative for cough, chest  tightness and shortness of breath.   Cardiovascular:  Negative for chest pain, palpitations and leg swelling.  Gastrointestinal:  Negative for abdominal pain, diarrhea, nausea and vomiting.  Genitourinary:  Negative for difficulty urinating and dysuria.  Musculoskeletal:  Negative for joint swelling and myalgias.  Skin:  Negative for color change and rash.  Neurological:  Negative for dizziness, light-headedness and headaches.  Hematological:  Negative for adenopathy. Does not bruise/bleed easily.  Psychiatric/Behavioral:  Negative for agitation and dysphoric mood.       Objective:     BP 110/72   Pulse 90   Temp 97.8 F (36.6 C)   Resp 16   Ht '5\' 2"'$  (1.575 m)   Wt 175 lb 3.2 oz (79.5 kg)   SpO2 98%   BMI 32.04 kg/m  Wt Readings from Last 3 Encounters:  01/31/21 175 lb 3.2 oz (79.5 kg)  10/10/20 181 lb 12.8 oz (82.5 kg)  07/13/20 186 lb 9.6 oz (84.6 kg)    Physical Exam Vitals reviewed.  Constitutional:      General: She is not in acute distress.    Appearance: Normal appearance. She is well-developed.  HENT:     Head: Normocephalic and atraumatic.     Right Ear: External ear normal.     Left Ear: External ear normal.  Eyes:     General: No scleral icterus.       Right eye: No discharge.        Left eye: No discharge.     Conjunctiva/sclera: Conjunctivae normal.  Neck:     Thyroid: No thyromegaly.  Cardiovascular:     Rate and Rhythm: Normal rate and regular rhythm.  Pulmonary:     Effort: No tachypnea, accessory muscle usage or respiratory distress.     Breath sounds: Normal breath sounds. No decreased breath sounds or wheezing.  Chest:  Breasts:    Right: No inverted nipple, mass, nipple discharge or tenderness (no axillary adenopathy).     Left: No inverted nipple, mass, nipple discharge or tenderness (no axilarry adenopathy).  Abdominal:     General: Bowel sounds are normal.     Palpations: Abdomen is soft.     Tenderness: There is no abdominal  tenderness.  Musculoskeletal:        General: No swelling or tenderness.     Cervical back: Neck supple.  Lymphadenopathy:     Cervical: No cervical adenopathy.  Skin:    Findings: No erythema or rash.  Neurological:     Mental Status: She is  alert and oriented to person, place, and time.  Psychiatric:        Mood and Affect: Mood normal.        Behavior: Behavior normal.     Outpatient Encounter Medications as of 01/31/2021  Medication Sig   apixaban (ELIQUIS) 5 MG TABS tablet Take 1 tablet (5 mg total) by mouth 2 (two) times daily.   diphenhydramine-acetaminophen (TYLENOL PM) 25-500 MG TABS Take 1 tablet by mouth at bedtime as needed.   dorzolamide-timolol (COSOPT) 22.3-6.8 MG/ML ophthalmic solution Place 1 drop into both eyes 2 (two) times daily.   latanoprost (XALATAN) 0.005 % ophthalmic solution Place 1 drop into both eyes at bedtime.   metoprolol tartrate (LOPRESSOR) 25 MG tablet Take 12.5 mg by mouth 2 (two) times daily.    rosuvastatin (CRESTOR) 5 MG tablet Take 1 tablet (5 mg total) by mouth daily.   [DISCONTINUED] APOAEQUORIN PO Take by mouth.   No facility-administered encounter medications on file as of 01/31/2021.     Lab Results  Component Value Date   WBC 6.9 01/31/2021   HGB 13.9 01/31/2021   HCT 43.0 01/31/2021   PLT 152.0 01/31/2021   GLUCOSE 87 01/31/2021   CHOL 122 01/31/2021   TRIG 116.0 01/31/2021   HDL 54.90 01/31/2021   LDLCALC 43 01/31/2021   ALT 17 01/31/2021   AST 18 01/31/2021   NA 139 01/31/2021   K 4.1 01/31/2021   CL 105 01/31/2021   CREATININE 0.86 01/31/2021   BUN 12 01/31/2021   CO2 27 01/31/2021   TSH 2.73 01/31/2021    DG Chest 2 View  Result Date: 10/12/2020 CLINICAL DATA:  Follow-up abnormal chest radiograph. EXAM: CHEST - 2 VIEW COMPARISON:  06/30/2018 FINDINGS: Mild cardiomegaly. Chronic aortic atherosclerosis. The pulmonary vascularity is normal. The lungs are presently clear. No infiltrate, collapse or effusion. Mild spinal  scoliotic curvature and moderate kyphosis. No acute bone finding. IMPRESSION: No active disease. Mild cardiomegaly. Aortic atherosclerosis. Spinal scoliosis and kyphosis. Electronically Signed   By: Nelson Chimes M.D.   On: 10/12/2020 08:35       Assessment & Plan:   Problem List Items Addressed This Visit     Anemia    Follow cbc.       Relevant Orders   CBC with Differential/Platelet (Completed)   TSH (Completed)   Aortic atherosclerosis ([place])    Continue crestor.       Bronchiectasis without acute exacerbation ([place])    Previously saw pulmonary.  Breathing stable.       Cerebral atrophy ([place])    Found on MRI. Seeing neurology.       Dementia [facility])    Seeing neurology for concern regarding lewy body dementia.  No longer driving.  Son very involved.  Follow.        Health care maintenance    Physical today 01/31/21.  Mammogram 08/02/20 - Birads I.       Hypercholesterolemia    Continue crestor.  Low cholesterol diet and exercise.  Follow lipid panel and liver function tests.        Relevant Orders   Hepatic function panel (Completed)   Lipid panel (Completed)   Basic metabolic panel (Completed)   Other thrombophilia (HCC)    History of afib.  On eliquis.  Stable.       Paroxysmal atrial fibrillation (HCC)    Continue eliquis.  No increased heart rate or palpitations reported.  Follow.        Other Visit Diagnoses  Routine general medical examination at a health care facility    -  Primary   Need for immunization against influenza       Relevant Orders   Flu Vaccine QUAD High Dose(Fluad) (Completed)        Einar Pheasant, MD

## 2021-01-31 NOTE — Patient Instructions (Signed)
You can find pharmacies that have this formulation in stock at [email or web address]".  U.S. Food and Drug Administration amended the emergency use authorizations (EUAs) of the [employer] COVID-19 Vaccine and the Pfizer-BioNTech COVID-19 Vaccine to authorize bivalent formulations of the vaccines for use as a single booster dose at least two months following primary or booster vaccination.  Coronavirus ([id number]) Update: FDA Authorizes Moderna, Pfizer-BioNTech Bivalent COVID-19 Vaccines for Use as a Booster Dose

## 2021-02-01 ENCOUNTER — Telehealth: Payer: Self-pay

## 2021-02-01 DIAGNOSIS — R17 Unspecified jaundice: Secondary | ICD-10-CM

## 2021-02-01 NOTE — Telephone Encounter (Signed)
Labs ordered for liver panel for future labs

## 2021-02-06 ENCOUNTER — Encounter: Payer: Self-pay | Admitting: Internal Medicine

## 2021-02-06 NOTE — Assessment & Plan Note (Signed)
Seeing neurology for concern regarding lewy body dementia.  No longer driving.  Son very involved.  Follow.

## 2021-02-06 NOTE — Assessment & Plan Note (Signed)
Previously saw pulmonary.  Breathing stable.

## 2021-02-06 NOTE — Assessment & Plan Note (Signed)
Continue crestor 

## 2021-02-06 NOTE — Assessment & Plan Note (Signed)
Physical today 01/31/21.  Mammogram 08/02/20 - Birads I.

## 2021-02-06 NOTE — Assessment & Plan Note (Signed)
Continue eliquis.  No increased heart rate or palpitations reported.  Follow.

## 2021-02-06 NOTE — Assessment & Plan Note (Signed)
Continue crestor.  Low cholesterol diet and exercise. Follow lipid panel and liver function tests.   

## 2021-02-06 NOTE — Assessment & Plan Note (Signed)
Follow cbc.  

## 2021-02-06 NOTE — Assessment & Plan Note (Signed)
History of afib.  On eliquis.  Stable.

## 2021-02-06 NOTE — Assessment & Plan Note (Signed)
Found on MRI. Seeing neurology.

## 2021-02-15 DIAGNOSIS — R001 Bradycardia, unspecified: Secondary | ICD-10-CM | POA: Diagnosis not present

## 2021-02-15 DIAGNOSIS — R55 Syncope and collapse: Secondary | ICD-10-CM | POA: Diagnosis not present

## 2021-02-15 DIAGNOSIS — I480 Paroxysmal atrial fibrillation: Secondary | ICD-10-CM | POA: Diagnosis not present

## 2021-02-15 DIAGNOSIS — R0609 Other forms of dyspnea: Secondary | ICD-10-CM | POA: Diagnosis not present

## 2021-02-15 DIAGNOSIS — I208 Other forms of angina pectoris: Secondary | ICD-10-CM | POA: Diagnosis not present

## 2021-02-19 ENCOUNTER — Other Ambulatory Visit: Payer: Medicare Other

## 2021-02-19 ENCOUNTER — Telehealth: Payer: Self-pay | Admitting: Internal Medicine

## 2021-02-19 DIAGNOSIS — R17 Unspecified jaundice: Secondary | ICD-10-CM

## 2021-02-19 NOTE — Telephone Encounter (Signed)
Pt has a lab appt 02/20/2021, there are no orders in.

## 2021-02-19 NOTE — Addendum Note (Signed)
Addended by: Alisa Graff on: 02/19/2021 09:11 PM   Modules accepted: Orders

## 2021-02-19 NOTE — Telephone Encounter (Signed)
Order placed for liver panel.  

## 2021-02-20 ENCOUNTER — Other Ambulatory Visit (INDEPENDENT_AMBULATORY_CARE_PROVIDER_SITE_OTHER): Payer: Medicare Other

## 2021-02-20 ENCOUNTER — Other Ambulatory Visit: Payer: Self-pay

## 2021-02-20 DIAGNOSIS — R17 Unspecified jaundice: Secondary | ICD-10-CM | POA: Diagnosis not present

## 2021-02-20 LAB — HEPATIC FUNCTION PANEL
ALT: 13 U/L (ref 0–35)
AST: 18 U/L (ref 0–37)
Albumin: 4.1 g/dL (ref 3.5–5.2)
Alkaline Phosphatase: 50 U/L (ref 39–117)
Bilirubin, Direct: 0.3 mg/dL (ref 0.0–0.3)
Total Bilirubin: 1.2 mg/dL (ref 0.2–1.2)
Total Protein: 6.2 g/dL (ref 6.0–8.3)

## 2021-02-21 ENCOUNTER — Encounter: Payer: Self-pay | Admitting: Internal Medicine

## 2021-02-21 NOTE — Telephone Encounter (Signed)
Immunizations documented

## 2021-03-01 DIAGNOSIS — H524 Presbyopia: Secondary | ICD-10-CM | POA: Diagnosis not present

## 2021-03-01 DIAGNOSIS — H40153 Residual stage of open-angle glaucoma, bilateral: Secondary | ICD-10-CM | POA: Diagnosis not present

## 2021-04-17 IMAGING — MR MR HEAD W/O CM
12 series · 45 of 48 positions shown · non-contrast
Comparison: Brain MRI 03/22/2010.

CLINICAL DATA: Cognitive impairment.

EXAM:
MRI HEAD WITHOUT CONTRAST
TECHNIQUE: Multiplanar, multiecho pulse sequences of the brain and surrounding
structures were obtained without intravenous contrast.

[Series 5: ax dwi_tracew · axial · 3.0mm · 0.60mm/px · z∈[-86,+68]mm · 4 of 48 slices shown]
[im 1/48]
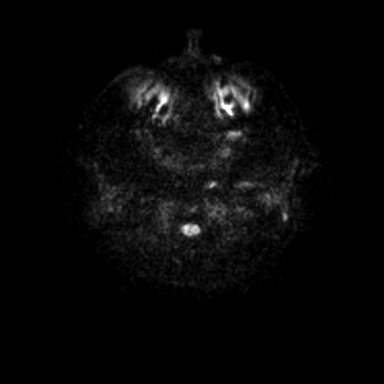
[im 16/48]
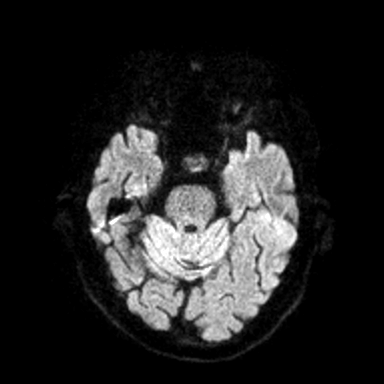
[im 32/48]
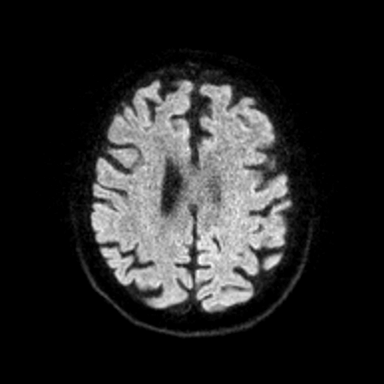
[im 48/48]
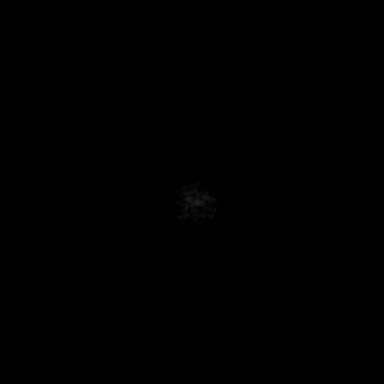

[Series 6: ax dwi_adc · axial · 3.0mm · 0.60mm/px · z∈[-86,+65]mm · 3 of 47 slices shown]
[im 1/47]
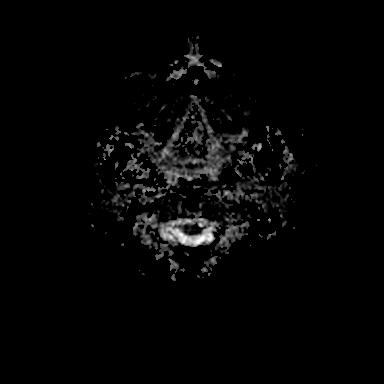
[im 24/47]
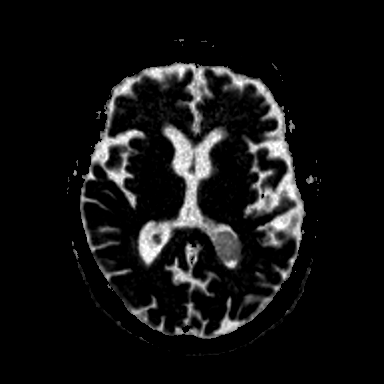
[im 47/47]
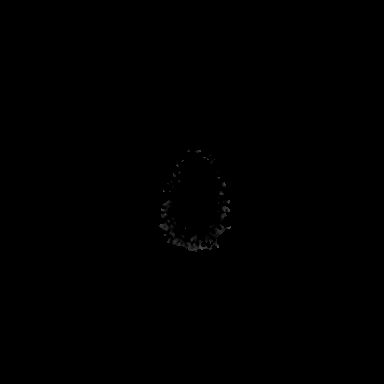

[Series 7: cor dwi_tracew · coronal · 5.0mm · 0.60mm/px · 3 of 38 slices shown]
[im 1/38]
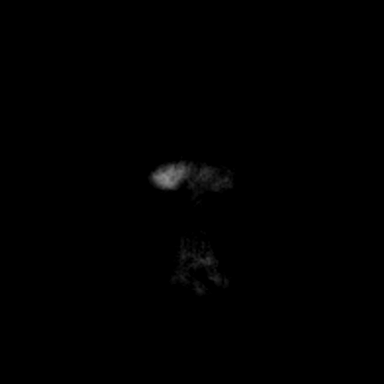
[im 19/38]
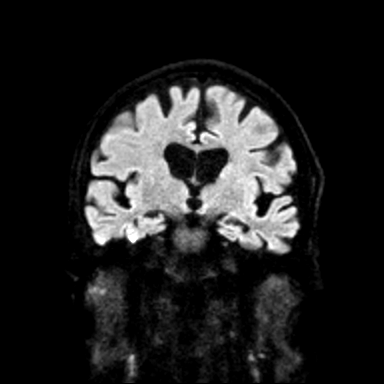
[im 38/38]
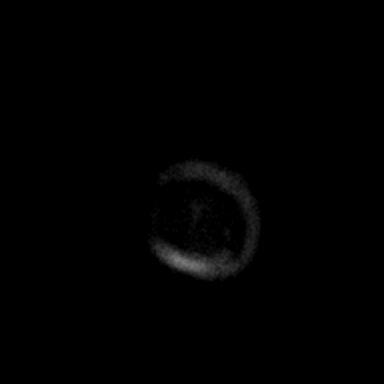

[Series 8: cor dwi_adc · coronal · 5.0mm · 0.60mm/px · 3 of 38 slices shown]
[im 1/38]
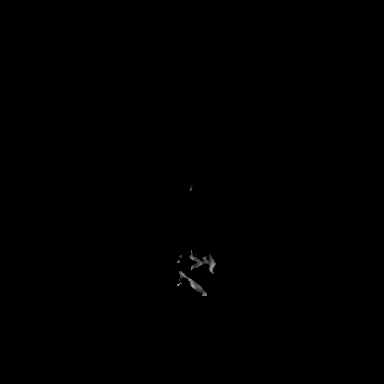
[im 19/38]
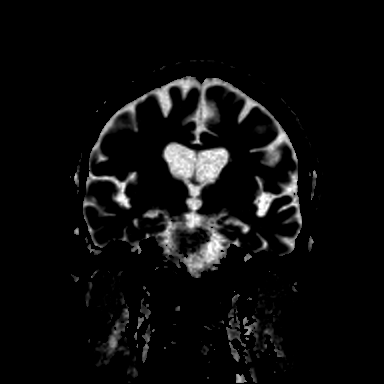
[im 38/38]
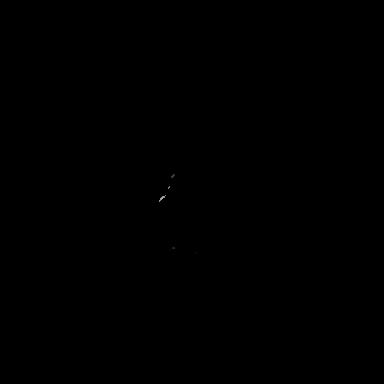

[Series 9: T1 · sagittal · 5.0mm · 0.62mm/px · 2 of 25 slices shown (1 of 2)]
[im 1/25]
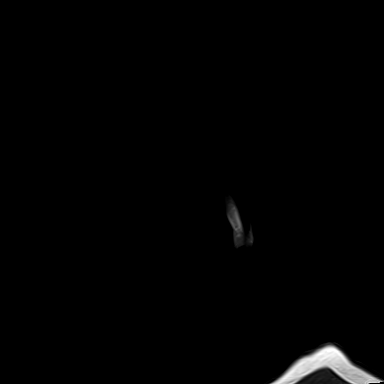
[im 25/25]
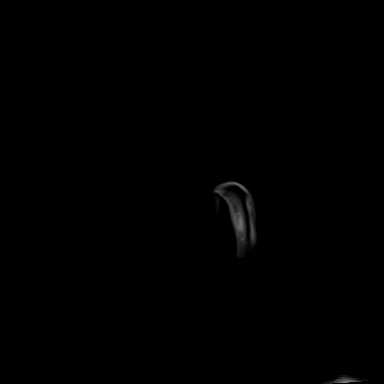

[Series 10: T2 · axial · 5.0mm · 0.53mm/px · z∈[-82,+61]mm · 2 of 25 slices shown (1 of 2)]
[im 1/25]
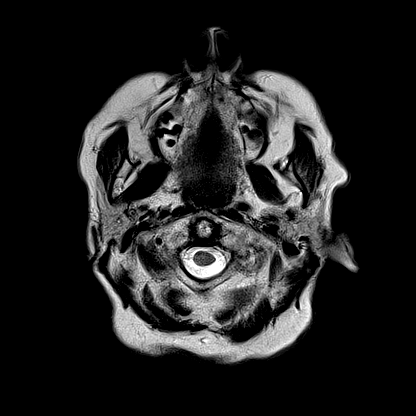
[im 25/25]
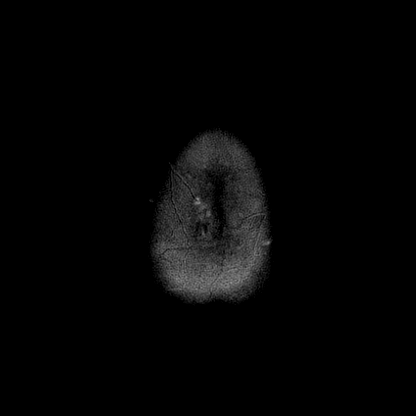

[Series 11: mag_images · axial · 3.0mm · 0.90mm/px · z∈[-98,+78]mm · 4 of 60 slices shown]
[im 1/60]
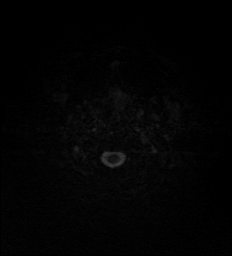
[im 20/60]
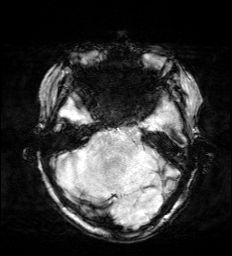
[im 40/60]
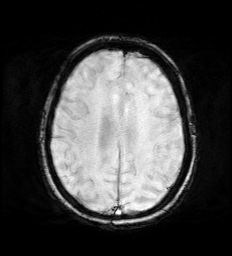
[im 60/60]
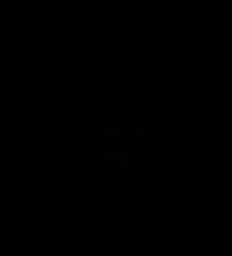

[Series 12: pha_images · axial · 3.0mm · 0.90mm/px · z∈[-98,+78]mm · 4 of 58 slices shown]
[im 1/58]
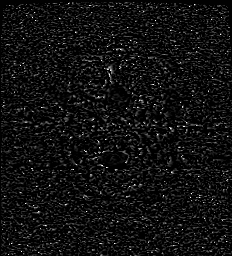
[im 20/58]
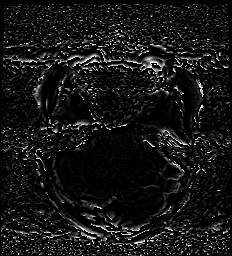
[im 39/58]
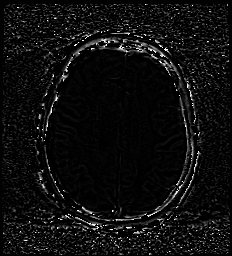
[im 58/58]
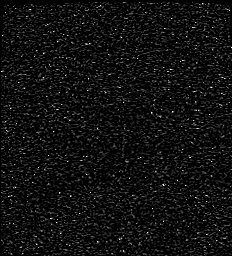

[Series 13: swi_images · axial · 3.0mm · 0.90mm/px · z∈[-98,+78]mm · 4 of 60 slices shown]
[im 1/60]
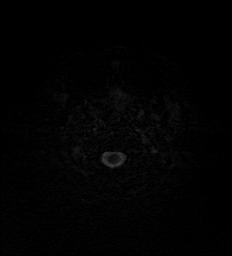
[im 20/60]
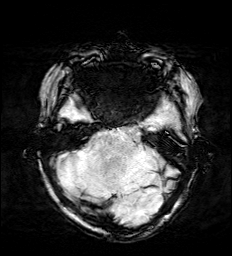
[im 40/60]
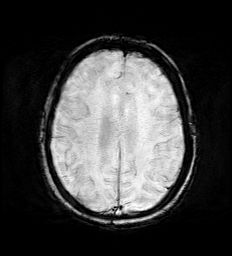
[im 60/60]
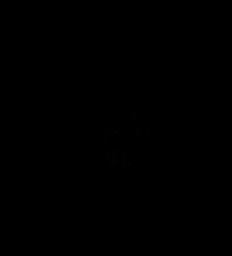

[Series 15: FLAIR · axial · 3.0mm · 0.53mm/px · z∈[-91,+70]mm · 4 of 55 slices shown]
[im 1/55]
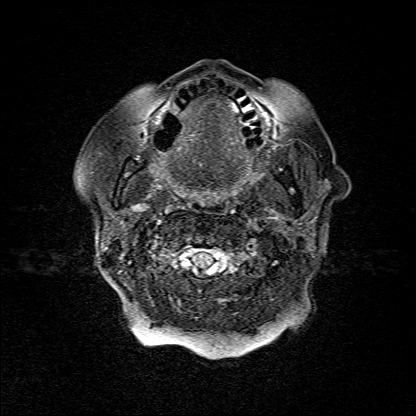
[im 19/55]
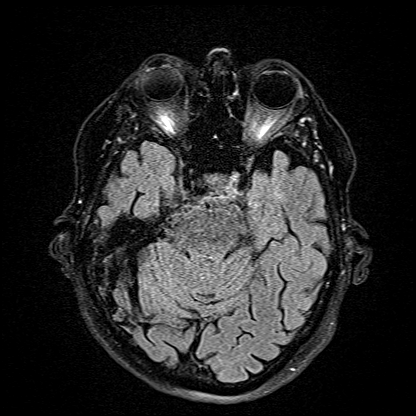
[im 37/55]
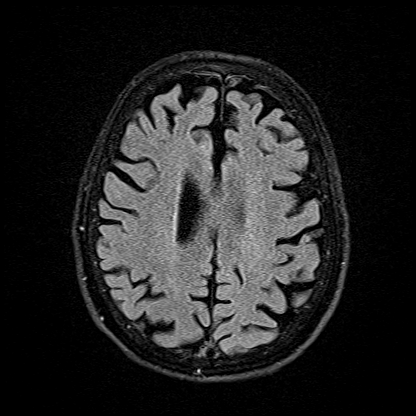
[im 55/55]
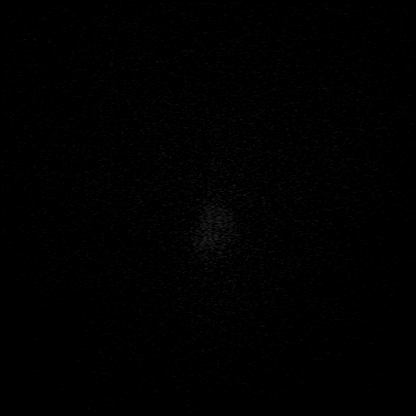

[Series 16: T1 · axial · 1.0mm · 0.98mm/px · z∈[-101,+74]mm · 10 of 176 slices shown (2 of 2)]
[im 1/176]
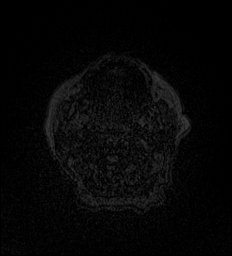
[im 15/176]
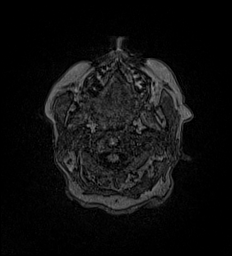
[im 30/176]
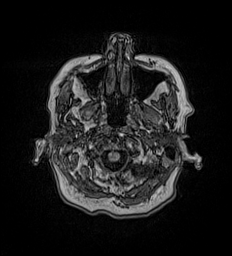
[im 44/176]
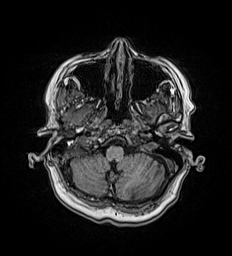
[im 59/176]
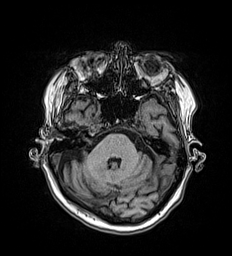
[im 73/176]
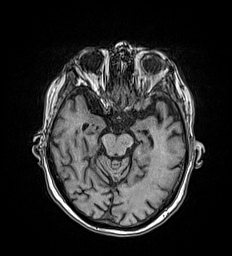
[im 103/176]
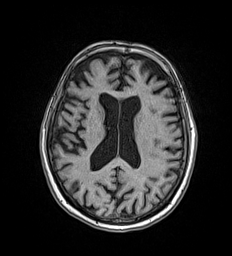
[im 117/176]
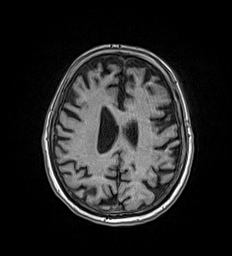
[im 146/176]
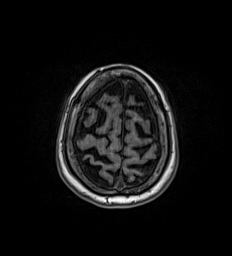
[im 176/176]
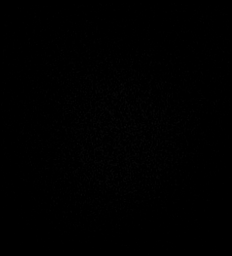

[Series 17: T2 · coronal · 5.0mm · 0.57mm/px · 2 of 29 slices shown (2 of 2)]
[im 1/29]
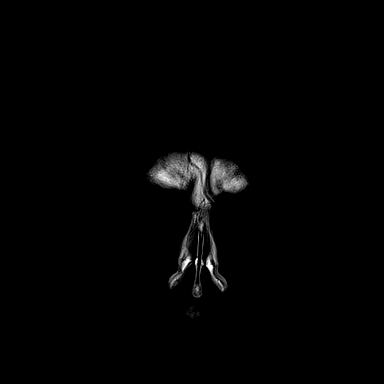
[im 29/29]
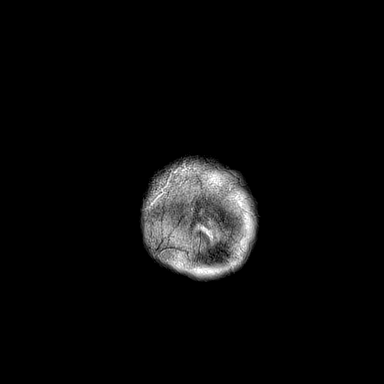

[45 of 48 positions shown; findings below may reference images not displayed]

FINDINGS: Brain:

Mild-to-moderate generalized parenchymal atrophy, progressed as
compared to the MRI of 03/22/2010. Cerebral atrophy demonstrates no
definite lobar predominance.

There is no significant white matter disease for age.

There is no acute infarct.

No evidence of intracranial mass.

No chronic intracranial blood products.

No extra-axial fluid collection.

No midline shift.

Vascular: Expected proximal arterial flow voids.

Skull and upper cervical spine: No focal marrow lesion.

Sinuses/Orbits: Visualized orbits show no acute finding. No
significant paranasal sinus disease or mastoid effusion at the
imaged levels.
IMPRESSION: No evidence of acute intracranial abnormality.

Mild-to-moderate generalized parenchymal atrophy, progressed as
compared to the MRI of 03/22/2010. Cerebral atrophy demonstrates no
definite lobar predominance.

## 2021-04-23 ENCOUNTER — Encounter: Payer: Self-pay | Admitting: Internal Medicine

## 2021-04-23 MED ORDER — ROSUVASTATIN CALCIUM 5 MG PO TABS: 5.0000 mg | ORAL_TABLET | Freq: Every day | ORAL | 1 refills | Status: AC

## 2021-05-18 ENCOUNTER — Ambulatory Visit (INDEPENDENT_AMBULATORY_CARE_PROVIDER_SITE_OTHER): Payer: Medicare Other

## 2021-05-18 VITALS — Ht 62.0 in | Wt 175.0 lb

## 2021-05-18 DIAGNOSIS — Z0000 Encounter for general adult medical examination without abnormal findings: Secondary | ICD-10-CM | POA: Diagnosis not present

## 2021-05-18 NOTE — Progress Notes (Addendum)
Subjective:   Bridget Briggs is a 87 y.o. female who presents for Medicare Annual (Subsequent) preventive examination.  Review of Systems    No ROS.  Medicare Wellness Virtual Visit.  Visual/audio telehealth visit, UTA vital signs.   See social history for additional risk factors.   Cardiac Risk Factors include: advanced age (>58men, >3 women)     Objective:    Today's Vitals   05/18/21 1345  Weight: 175 lb (79.4 kg)  Height: 5\' 2"  (1.575 m)   Body mass index is 32.01 kg/m.  Advanced Directives 05/18/2021 05/17/2020 05/11/2019 05/07/2018 05/06/2017 04/25/2016 09/06/2015  Does Patient Have a Medical Advance Directive? Yes Yes Yes Yes Yes Yes Yes  Type of Paramedic of Deputy;Living will - Living will [place];Living will Living will [place];Living will -  Does patient want to make changes to medical advance directive? No - Patient declined No - Patient declined No - Patient declined No - Patient declined No - Patient declined - No - Patient declined  Copy of [place] in Chart? Yes - validated most recent copy scanned in chart (See row information) - - Yes - validated most recent copy scanned in chart (See row information) - No - copy requested -    Current Medications (verified) Outpatient Encounter Medications as of 05/18/2021  Medication Sig   apixaban (ELIQUIS) 5 MG TABS tablet Take 1 tablet (5 mg total) by mouth 2 (two) times daily.   diphenhydramine-acetaminophen (TYLENOL PM) 25-500 MG TABS Take 1 tablet by mouth at bedtime as needed.   dorzolamide-timolol (COSOPT) 22.3-6.8 MG/ML ophthalmic solution Place 1 drop into both eyes 2 (two) times daily.   latanoprost (XALATAN) 0.005 % ophthalmic solution Place 1 drop into both eyes at bedtime.   metoprolol tartrate (LOPRESSOR) 25 MG tablet Take 12.5 mg by mouth 2 (two) times daily.    rosuvastatin (CRESTOR) 5 MG tablet Take 1 tablet (5 mg  total) by mouth daily.   No facility-administered encounter medications on file as of 05/18/2021.    Allergies (verified) Penicillins   History: Past Medical History:  Diagnosis Date   Arthritis    Depression    GERD (gastroesophageal reflux disease)    Glaucoma    History of chicken pox    History of colon polyps    Hx: UTI (urinary tract infection)    Past Surgical History:  Procedure Laterality Date   ABDOMINAL HYSTERECTOMY  1974   left ovary removed   ANKLE SURGERY Left 1998   APPENDECTOMY  1974   BREAST BIOPSY Right 1988   NEG   BREAST SURGERY Right 1988   biopsy   CARDIAC CATHETERIZATION N/A 09/06/2015   Procedure: Right/Left Heart Cath and Coronary Angiography;  Surgeon: Yolonda Kida, MD;  Location: [facility];  Service: Cardiovascular;  Laterality: N/A;   CHOLECYSTECTOMY  1996   EYE SURGERY Bilateral 2005/2006   cataract   LAPAROSCOPIC GASTRIC BANDING WITH HIATAL HERNIA REPAIR  0630   NISSEN FUNDOPLICATION  1601   OOPHORECTOMY Left    TONSILLECTOMY AND ADENOIDECTOMY  1940   Family History  Problem Relation Age of Onset   Arthritis Mother    Leukemia Mother    Lung cancer Father    Osteoporosis Other    Breast cancer Neg Hx    Social History   Socioeconomic History   Marital status: Widowed    Spouse name: Not on file   Number of children:  Not on file   Years of education: Not on file   Highest education level: Not on file  Occupational History   Not on file  Tobacco Use   Smoking status: Former    Packs/day: 1.00    Years: 30.00    Pack years: 30.00    Types: Cigarettes    Quit date: 05/20/1988    Years since quitting: 33.0   Smokeless tobacco: Never  Vaping Use   Vaping Use: Never used  Substance and Sexual Activity   Alcohol use: No    Alcohol/week: 0.0 standard drinks   Drug use: No   Sexual activity: Never  Other Topics Concern   Not on file  Social History Narrative   Not on file   Social Determinants of Health    Financial Resource Strain: Low Risk    Difficulty of Paying Living Expenses: Not hard at all  Food Insecurity: No Food Insecurity   Worried About Charity fundraiser in the Last Year: Never true   [place] in the Last Year: Never true  Transportation Needs: No Transportation Needs   Lack of Transportation (Medical): No   Lack of Transportation (Non-Medical): No  Physical Activity: Not on file  Stress: No Stress Concern Present   Feeling of Stress : Not at all  Social Connections: Not on file    Tobacco Counseling Counseling given: Not Answered   Clinical Intake:  Pre-visit preparation completed: Yes        Diabetes: No  How often do you need to have someone help you when you read instructions, pamphlets, or other written materials from your doctor or pharmacy?: 1 - Never  Interpreter Needed?: No      Activities of Daily Living In your present state of health, do you have any difficulty performing the following activities: 05/18/2021  Hearing? Y  Vision? N  Difficulty concentrating or making decisions? Y  Comment Dementia ([place])  Walking or climbing stairs? Y  Comment Paces self  Dressing or bathing? N  Doing errands, shopping? Y  Comment No longer drives  Preparing Food and eating ? N  Using the Toilet? N  In the past six months, have you accidently leaked urine? N  Do you have problems with loss of bowel control? N  Managing your Medications? N  Managing your Finances? N  Housekeeping or managing your Housekeeping? N  Some recent data might be hidden    Patient Care Team: Einar Pheasant, MD as PCP - General (Internal Medicine)  Indicate any recent Medical Services you may have received from other than [facility] providers in the past year (date may be approximate).     Assessment:   This is a routine wellness examination for Bridget Briggs.  Virtual Visit via Telephone Note  I connected via telephone with son of patient (HIPAA compliant). PHI discussed  on 05/18/21 at  1:15 PM EST by telephone. Once concluded, nurse connected with patient for continued health information regarding wellness. Identification verified using two identifiers.  Persons participating in the telephone visit: patient/son/Nurse Health Advisor   I discussed the limitations, risks, security and privacy concerns of performing an evaluation and management service by telephone and the availability of in person appointments. The patient expressed understanding and agreed to proceed.  Interactive audio and video telecommunications were attempted between this nurse and patient, however failed, due to patient having technical difficulties OR patient did not have access to video capability.  We continued and completed visit with audio  only.  Some vital signs may be absent or patient reported.   Hearing/Vision screen Hearing Screening - Comments:: Difficulty hearing  Patient no longer wears hearing aids due to patient preference Vision Screening - Comments:: Followed by Dr. Gloriann Loan  Wears corrective lenses  Glaucoma; drops in use  Cataract extraction, bilateral   Dietary issues and exercise activities discussed:   Regular diet Meals on Wheels    Goals Addressed               This Visit's Progress     Patient Stated     Follow up with PCP as needed (pt-stated)         Depression Screen PHQ 2/9 Scores 05/18/2021 10/10/2020 05/17/2020 05/11/2019 10/09/2018 05/07/2018 05/06/2017  PHQ - 2 Score 0 0 0 0 0 0 0  PHQ- 9 Score - 0 - - 0 - -    Fall Risk Fall Risk  05/18/2021 05/17/2020 05/11/2019 02/19/2019 10/09/2018  Falls in the past year? 0 0 0 0 0  Number falls in past yr: 0 0 - - 0  Injury with Fall? - 0 - - 0  Follow up Falls evaluation completed Falls evaluation completed Falls prevention discussed Falls evaluation completed Falls evaluation completed    [place]: Home free of loose throw rugs in walkways, pet beds, electrical  cords, etc? Yes  Adequate lighting in your home to reduce risk of falls? Yes   ASSISTIVE DEVICES UTILIZED TO PREVENT FALLS: Life alert? Yes  Use of a cane, walker or w/c? Yes  Grab bars in the bathroom? Yes  Shower chair or bench in shower? Yes   TIMED UP AND GO: Was the test performed? No .   Cognitive Function: MMSE - Mini Mental State Exam 05/18/2021 05/06/2017 04/26/2015  Not completed: Unable to complete - -  Orientation to time - 5 5  Orientation to Place - 5 5  Registration - 3 3  Attention/ Calculation - 5 5  Recall - 3 3  Language- name 2 objects - 2 2  Language- repeat - 1 1  Language- follow 3 step command - 3 3  Language- read & follow direction - 1 1  Write a sentence - 1 1  Copy design - 1 1  Total score - 30 30     6CIT Screen 05/11/2019 05/07/2018 04/25/2016  What Year? 0 points 0 points 0 points  What month? 0 points 0 points 0 points  What time? 0 points 0 points 0 points  Count back from 20 0 points 0 points 0 points  Months in reverse 0 points 0 points 0 points  Repeat phrase 4 points 0 points -  Total Score 4 0 -    Immunizations Immunization History  Administered Date(s) Administered   Fluad Quad(high Dose 65+) 02/19/2019, 06/29/2019, 02/29/2020, 01/31/2021   Influenza Split 02/22/2013   Influenza, High Dose Seasonal PF 06/10/2016, 02/27/2017   Influenza,inj,Quad PF,6+ Mos 01/20/2014, 03/02/2015, 02/13/2018   Moderna Covid-19 Vaccine Bivalent Booster 55yrs & up 02/20/2021   PFIZER(Purple Top)SARS-COV-2 Vaccination 07/13/2019, 08/03/2019, 04/25/2020   Pneumococcal Conjugate-13 01/20/2014   Pneumococcal Polysaccharide-23 09/12/2016   Zoster Recombinat (Shingrix) 08/02/2018, 02/20/2021   TDAP status: Due, Education has been provided regarding the importance of this vaccine. Advised may receive this vaccine at local pharmacy or Health Dept. Aware to provide a copy of the vaccination record if obtained from local pharmacy or Health Dept. Verbalized  acceptance and understanding.  Screening Tests Health Maintenance  Topic Date Due   TETANUS/TDAP  05/18/2022 (Originally 01/28/1950)   MAMMOGRAM  08/01/2021   Pneumonia Vaccine 65+ Years old  Completed   INFLUENZA VACCINE  Completed   DEXA SCAN  Completed   COVID-19 Vaccine  Completed   Zoster Vaccines- Shingrix  Completed   HPV VACCINES  Aged Out   Health Maintenance There are no preventive care reminders to display for this patient.  Hepatitis C Screening: does not qualify  Vision Screening: Recommended annual ophthalmology exams for early detection of glaucoma and other disorders of the eye.  Dental Screening: Recommended annual dental exams for proper oral hygiene  Community Resource Referral / Chronic Care Management: CRR required this visit?  No   CCM required this visit?  No      Plan:   Keep all routine maintenance appointments.   Notes: Patient lives independently in [place], son lives in [place] and is her Medical Power of [place]. Son has some concerns of patient overall wellbeing with medication administration and short term memory. Considering discussion with PCP in the near future in regards to independent living. Upcoming appointment noted.  I have personally reviewed and noted the following in the patients chart:   Medical and social history Use of alcohol, tobacco or illicit drugs  Current medications and supplements including opioid prescriptions. Not taking opioid.  Functional ability and status Nutritional status Physical activity Advanced directives List of other physicians Hospitalizations, surgeries, and ER visits in previous 12 months Vitals Screenings to include cognitive, depression, and falls Referrals and appointments  In addition, I have reviewed and discussed with patient certain preventive protocols, quality metrics, and best practice recommendations. A written personalized care plan for preventive services as well as general  preventive health recommendations were provided to patient.     Varney Biles, LPN   [id number]

## 2021-05-18 NOTE — Patient Instructions (Addendum)
°  Bridget Briggs , Thank you for taking time to come for your Medicare Wellness Visit. I appreciate your ongoing commitment to your health goals. Please review the following plan we discussed and let me know if I can assist you in the future.   These are the goals we discussed:  Goals       Patient Stated     Follow up with PCP as needed (pt-stated)        This is a list of the screening recommended for you and due dates:  Health Maintenance  Topic Date Due   Tetanus Vaccine  05/18/2022*   Mammogram  08/01/2021   Pneumonia Vaccine  Completed   Flu Shot  Completed   DEXA scan (bone density measurement)  Completed   COVID-19 Vaccine  Completed   Zoster (Shingles) Vaccine  Completed   HPV Vaccine  Aged Out  *Topic was postponed. The date shown is not the original due date.

## 2021-05-19 ENCOUNTER — Other Ambulatory Visit: Payer: Self-pay | Admitting: Internal Medicine

## 2021-05-28 ENCOUNTER — Encounter: Payer: Self-pay | Admitting: Internal Medicine

## 2021-06-05 ENCOUNTER — Ambulatory Visit: Payer: Medicare Other | Admitting: Internal Medicine

## 2021-06-13 ENCOUNTER — Encounter: Payer: Self-pay | Admitting: Internal Medicine

## 2021-06-13 NOTE — Telephone Encounter (Signed)
If he is ok with talking about placement in the presence of his mother, we can do at the visit.

## 2021-06-18 ENCOUNTER — Other Ambulatory Visit: Payer: Self-pay

## 2021-06-18 ENCOUNTER — Ambulatory Visit: Payer: Medicare Other | Admitting: Internal Medicine

## 2021-06-18 ENCOUNTER — Encounter: Payer: Self-pay | Admitting: Internal Medicine

## 2021-06-18 VITALS — BP 110/70 | HR 115 | Temp 97.4°F | Ht 62.0 in | Wt 172.8 lb

## 2021-06-18 DIAGNOSIS — F0390 Unspecified dementia without behavioral disturbance: Secondary | ICD-10-CM | POA: Diagnosis not present

## 2021-06-18 DIAGNOSIS — D649 Anemia, unspecified: Secondary | ICD-10-CM

## 2021-06-18 DIAGNOSIS — I779 Disorder of arteries and arterioles, unspecified: Secondary | ICD-10-CM

## 2021-06-18 DIAGNOSIS — I700 Atherosclerosis of aorta: Secondary | ICD-10-CM

## 2021-06-18 DIAGNOSIS — I480 Paroxysmal atrial fibrillation: Secondary | ICD-10-CM | POA: Diagnosis not present

## 2021-06-18 DIAGNOSIS — E7800 Pure hypercholesterolemia, unspecified: Secondary | ICD-10-CM

## 2021-06-18 DIAGNOSIS — E538 Deficiency of other specified B group vitamins: Secondary | ICD-10-CM

## 2021-06-18 DIAGNOSIS — D6869 Other thrombophilia: Secondary | ICD-10-CM

## 2021-06-18 DIAGNOSIS — M810 Age-related osteoporosis without current pathological fracture: Secondary | ICD-10-CM | POA: Diagnosis not present

## 2021-06-18 DIAGNOSIS — J479 Bronchiectasis, uncomplicated: Secondary | ICD-10-CM

## 2021-06-18 DIAGNOSIS — G319 Degenerative disease of nervous system, unspecified: Secondary | ICD-10-CM

## 2021-06-18 LAB — HEPATIC FUNCTION PANEL
ALT: 14 U/L (ref 0–35)
AST: 20 U/L (ref 0–37)
Albumin: 4.1 g/dL (ref 3.5–5.2)
Alkaline Phosphatase: 56 U/L (ref 39–117)
Bilirubin, Direct: 0.2 mg/dL (ref 0.0–0.3)
Total Bilirubin: 1.2 mg/dL (ref 0.2–1.2)
Total Protein: 6.5 g/dL (ref 6.0–8.3)

## 2021-06-18 LAB — BASIC METABOLIC PANEL
BUN: 16 mg/dL (ref 6–23)
CO2: 25 mEq/L (ref 19–32)
Calcium: 9.7 mg/dL (ref 8.4–10.5)
Chloride: 103 mEq/L (ref 96–112)
Creatinine, Ser: 1.1 mg/dL (ref 0.40–1.20)
GFR: 44.9 mL/min — ABNORMAL LOW (ref >60.00)
Glucose, Bld: 89 mg/dL (ref 70–99)
Potassium: 4.3 mEq/L (ref 3.5–5.1)
Sodium: 138 mEq/L (ref 135–145)

## 2021-06-18 LAB — LIPID PANEL
Cholesterol: 131 mg/dL (ref 0–200)
HDL: 51.3 mg/dL (ref >39.00)
LDL Cholesterol: 57 mg/dL (ref 0–99)
NonHDL: 79.33
Total CHOL/HDL Ratio: 3
Triglycerides: 112 mg/dL (ref 0.0–149.0)
VLDL: 22.4 mg/dL (ref 0.0–40.0)

## 2021-06-18 LAB — VITAMIN B12: Vitamin B-12: 174 pg/mL — ABNORMAL LOW (ref 211–911)

## 2021-06-18 NOTE — Progress Notes (Addendum)
Patient ID: Bridget Briggs, female   DOB: Oct 12, 1930, 88 y.o.   MRN: [id number]   Subjective:    Patient ID: Bridget Briggs, female    DOB: 11/04/1930, 88 y.o.   MRN: [id number]  This visit occurred during the SARS-CoV-2 public health emergency.  Safety protocols were in place, including screening questions prior to the visit, additional usage of staff PPE, and extensive cleaning of exam room while observing appropriate contact time as indicated for disinfecting solutions.   Patient here for a scheduled follow up.  Marland Kitchen   HPI Here to follow up regarding her cholesterol, afib and to discuss assisted living.  She is accompanied by her son.  History obtained from both of them. She reports she is doing relatively well.  Denies any chest pain.  Breathing stable.  No increased cough or congestion.  No abdominal pain.  Bowels moving.  Son reports concern regarding her continuing to live by herself.  He lives out of town.  Very involved.  Visits a lot.  Trying to help with her medications.  Counting medication.  She is missing medication.  Concern about her safety.  Discussed assisted living.  Discussed social work involvement to help with placement.     Past Medical History:  Diagnosis Date   Arthritis    Depression    GERD (gastroesophageal reflux disease)    Glaucoma    History of chicken pox    History of colon polyps    Hx: UTI (urinary tract infection)    Past Surgical History:  Procedure Laterality Date   ABDOMINAL HYSTERECTOMY  1974   left ovary removed   ANKLE SURGERY Left 1998   APPENDECTOMY  1974   BREAST BIOPSY Right 1988   NEG   BREAST SURGERY Right 1988   biopsy   CARDIAC CATHETERIZATION N/A 09/06/2015   Procedure: Right/Left Heart Cath and Coronary Angiography;  Surgeon: Yolonda Kida, MD;  Location: [facility];  Service: Cardiovascular;  Laterality: N/A;   CHOLECYSTECTOMY  1996   EYE SURGERY Bilateral 2005/2006   cataract   LAPAROSCOPIC GASTRIC BANDING WITH  HIATAL HERNIA REPAIR  1751   NISSEN FUNDOPLICATION  0258   OOPHORECTOMY Left    TONSILLECTOMY AND ADENOIDECTOMY  1940   Family History  Problem Relation Age of Onset   Arthritis Mother    Leukemia Mother    Lung cancer Father    Osteoporosis Other    Breast cancer Neg Hx    Social History   Socioeconomic History   Marital status: Widowed    Spouse name: Not on file   Number of children: Not on file   Years of education: Not on file   Highest education level: Not on file  Occupational History   Not on file  Tobacco Use   Smoking status: Former    Packs/day: 1.00    Years: 30.00    Pack years: 30.00    Types: Cigarettes    Quit date: 05/20/1988    Years since quitting: 33.1   Smokeless tobacco: Never  Vaping Use   Vaping Use: Never used  Substance and Sexual Activity   Alcohol use: No    Alcohol/week: 0.0 standard drinks   Drug use: No   Sexual activity: Never  Other Topics Concern   Not on file  Social History Narrative   Not on file   Social Determinants of Health   Financial Resource Strain: Low Risk    Difficulty of Paying Living Expenses: Not  hard at all  Food Insecurity: No Food Insecurity   Worried About Charity fundraiser in the Last Year: Never true   Ran Out of Food in the Last Year: Never true  Transportation Needs: No Transportation Needs   Lack of Transportation (Medical): No   Lack of Transportation (Non-Medical): No  Physical Activity: Not on file  Stress: No Stress Concern Present   Feeling of Stress : Not at all  Social Connections: Not on file     Review of Systems  Constitutional:  Negative for appetite change and unexpected weight change.  HENT:  Negative for congestion and sinus pressure.   Respiratory:  Negative for cough, chest tightness and shortness of breath.   Cardiovascular:  Negative for chest pain, palpitations and leg swelling.  Gastrointestinal:  Negative for abdominal pain, diarrhea, nausea and vomiting.  Genitourinary:   Negative for difficulty urinating and dysuria.  Musculoskeletal:  Negative for joint swelling and myalgias.  Skin:  Negative for color change and rash.  Neurological:  Negative for dizziness, light-headedness and headaches.  Psychiatric/Behavioral:  Negative for agitation and dysphoric mood.       Objective:     BP 110/70 (BP Location: Left Arm, Patient Position: Sitting, Cuff Size: Large)    Pulse (!) 115    Temp (!) 97.4 F (36.3 C) (Temporal)    Ht 5\' 2"  (1.575 m)    Wt 172 lb 12.8 oz (78.4 kg)    SpO2 97%    BMI 31.61 kg/m  Wt Readings from Last 3 Encounters:  06/18/21 172 lb 12.8 oz (78.4 kg)  05/18/21 175 lb (79.4 kg)  01/31/21 175 lb 3.2 oz (79.5 kg)    Physical Exam Vitals reviewed.  Constitutional:      General: She is not in acute distress.    Appearance: Normal appearance.  HENT:     Head: Normocephalic and atraumatic.     Right Ear: External ear normal.     Left Ear: External ear normal.  Eyes:     General: No scleral icterus.       Right eye: No discharge.        Left eye: No discharge.     Conjunctiva/sclera: Conjunctivae normal.  Neck:     Thyroid: No thyromegaly.  Cardiovascular:     Rate and Rhythm: Normal rate and regular rhythm.  Pulmonary:     Effort: No respiratory distress.     Breath sounds: Normal breath sounds. No wheezing.  Abdominal:     General: Bowel sounds are normal.     Palpations: Abdomen is soft.     Tenderness: There is no abdominal tenderness.  Musculoskeletal:        General: No swelling or tenderness.     Cervical back: Neck supple. No tenderness.  Lymphadenopathy:     Cervical: No cervical adenopathy.  Skin:    Findings: No erythema or rash.  Neurological:     Mental Status: She is alert.  Psychiatric:        Mood and Affect: Mood normal.        Behavior: Behavior normal.     Outpatient Encounter Medications as of 06/18/2021  Medication Sig   dorzolamide-timolol (COSOPT) 22.3-6.8 MG/ML ophthalmic solution Place 1 drop  into both eyes 2 (two) times daily.   ELIQUIS 5 MG TABS tablet TAKE 1 TABLET(5 MG) BY MOUTH TWICE DAILY   latanoprost (XALATAN) 0.005 % ophthalmic solution Place 1 drop into both eyes at bedtime.   metoprolol tartrate (  LOPRESSOR) 25 MG tablet Take 12.5 mg by mouth 2 (two) times daily.    rosuvastatin (CRESTOR) 5 MG tablet Take 1 tablet (5 mg total) by mouth daily.   VYZULTA 0.024 % SOLN Apply 1 drop to eye at bedtime.   [DISCONTINUED] diphenhydramine-acetaminophen (TYLENOL PM) 25-500 MG TABS Take 1 tablet by mouth at bedtime as needed. (Patient not taking: Reported on 06/18/2021)   No facility-administered encounter medications on file as of 06/18/2021.     Lab Results  Component Value Date   WBC 6.9 01/31/2021   HGB 13.9 01/31/2021   HCT 43.0 01/31/2021   PLT 152.0 01/31/2021   GLUCOSE 89 06/18/2021   CHOL 131 06/18/2021   TRIG 112.0 06/18/2021   HDL 51.30 06/18/2021   LDLCALC 57 06/18/2021   ALT 14 06/18/2021   AST 20 06/18/2021   NA 138 06/18/2021   K 4.3 06/18/2021   CL 103 06/18/2021   CREATININE 1.10 06/18/2021   BUN 16 06/18/2021   CO2 25 06/18/2021   TSH 2.73 01/31/2021    DG Chest 2 View  Result Date: 10/12/2020 CLINICAL DATA:  Follow-up abnormal chest radiograph. EXAM: CHEST - 2 VIEW COMPARISON:  06/30/2018 FINDINGS: Mild cardiomegaly. Chronic aortic atherosclerosis. The pulmonary vascularity is normal. The lungs are presently clear. No infiltrate, collapse or effusion. Mild spinal scoliotic curvature and moderate kyphosis. No acute bone finding. IMPRESSION: No active disease. Mild cardiomegaly. Aortic atherosclerosis. Spinal scoliosis and kyphosis. Electronically Signed   By: Nelson Chimes M.D.   On: 10/12/2020 08:35       Assessment & Plan:   Problem List Items Addressed This Visit     Anemia    Follow cbc.       Aortic atherosclerosis (HCC)    Continue crestor.       B12 deficiency    Check B12 level.       Bronchiectasis without acute exacerbation  ([place])    Previously saw pulmonary.  Breathing stable.       Carotid artery disease (HCC)    Saw Dr Delana Meyer.  Carotid ultrasound - no significant stenosis.       Cerebral atrophy ([place])    Found on MRI. Seeing neurology.       Dementia [facility])    Seeing neurology for concern regarding lewy body dementia.  No longer driving.  Son very involved.  Concern regarding taking medication regularly, etc.  Discussed assisted living.  Consult social work.       Relevant Orders   Vitamin B12 (Completed)   AMB Referral to Community Care Coordinaton   Hypercholesterolemia    Continue crestor.  Low cholesterol diet and exercise.  Follow lipid panel and liver function tests.        Relevant Orders   Lipid panel (Completed)   Hepatic function panel (Completed)   Basic metabolic panel (Completed)   Osteoporosis   Other thrombophilia ([place])    History of afib.  On eliquis.  Stable.       Paroxysmal atrial fibrillation (HCC) - Primary    Continue eliquis.  No increased heart rate or palpitations reported.  Follow.          Einar Pheasant, MD

## 2021-06-21 ENCOUNTER — Telehealth: Payer: Self-pay

## 2021-06-21 NOTE — Telephone Encounter (Signed)
LM for son regarding labs

## 2021-06-25 NOTE — Telephone Encounter (Signed)
See result note.  

## 2021-06-25 NOTE — Telephone Encounter (Signed)
Patient son Bridget Briggs calling to speak with Bridget Briggs, returning a call from last week. Please call Bridget Briggs @ [phone] this is the best number to reach him at

## 2021-06-26 ENCOUNTER — Encounter: Payer: Self-pay | Admitting: Internal Medicine

## 2021-06-27 NOTE — Telephone Encounter (Signed)
See result note.  

## 2021-06-30 ENCOUNTER — Encounter: Payer: Self-pay | Admitting: Internal Medicine

## 2021-06-30 NOTE — Assessment & Plan Note (Signed)
Check B12 level. 

## 2021-06-30 NOTE — Assessment & Plan Note (Signed)
Follow cbc.  

## 2021-06-30 NOTE — Assessment & Plan Note (Signed)
History of afib.  On eliquis.  Stable.

## 2021-06-30 NOTE — Assessment & Plan Note (Signed)
Found on MRI. Seeing neurology.

## 2021-06-30 NOTE — Assessment & Plan Note (Signed)
Continue eliquis.  No increased heart rate or palpitations reported.  Follow.

## 2021-06-30 NOTE — Assessment & Plan Note (Signed)
Continue crestor 

## 2021-06-30 NOTE — Assessment & Plan Note (Signed)
Saw Dr Delana Meyer.  Carotid ultrasound - no significant stenosis.

## 2021-06-30 NOTE — Assessment & Plan Note (Signed)
Continue crestor.  Low cholesterol diet and exercise. Follow lipid panel and liver function tests.   

## 2021-06-30 NOTE — Assessment & Plan Note (Addendum)
Seeing neurology for concern regarding lewy body dementia.  No longer driving.  Son very involved.  Concern regarding taking medication regularly, etc.  Discussed assisted living.  Consult social work.

## 2021-06-30 NOTE — Assessment & Plan Note (Signed)
Previously saw pulmonary.  Breathing stable.

## 2021-07-03 ENCOUNTER — Telehealth: Payer: Self-pay

## 2021-07-03 NOTE — Chronic Care Management (AMB) (Signed)
Chronic Care Management   Note  07/03/2021 Name: Bridget Briggs MRN: [id number] DOB: 1930-08-21  Bridget Briggs is a 88 y.o. year old female who is a primary care patient of Einar Pheasant, MD. I reached out to Rometta Emery by phone today in response to a referral sent by Ms. Bergen PCP.  Ms. Perrier was given information about Chronic Care Management services today including:  CCM service includes personalized support from designated clinical staff supervised by her physician, including individualized plan of care and coordination with other care providers 24/7 contact phone numbers for assistance for urgent and routine care needs. Service will only be billed when office clinical staff spend 20 minutes or more in a month to coordinate care. Only one practitioner may furnish and bill the service in a calendar month. The patient may stop CCM services at any time (effective at the end of the month) by phone call to the office staff. The patient is responsible for co-pay (up to 20% after annual deductible is met) if co-pay is required by the individual health plan.   Patient agreed to services and verbal consent obtained.   Follow up plan: Telephone appointment with care management team member scheduled for:07/25/2021  Noreene Larsson, [address], [place], [place] [address] Direct Dial: [phone] [email or web address]

## 2021-07-04 ENCOUNTER — Ambulatory Visit (INDEPENDENT_AMBULATORY_CARE_PROVIDER_SITE_OTHER): Payer: Medicare Other | Admitting: *Deleted

## 2021-07-04 ENCOUNTER — Other Ambulatory Visit: Payer: Self-pay

## 2021-07-04 DIAGNOSIS — E538 Deficiency of other specified B group vitamins: Secondary | ICD-10-CM

## 2021-07-04 MED ORDER — CYANOCOBALAMIN 1000 MCG/ML IJ SOLN
1000.0000 ug | Freq: Once | INTRAMUSCULAR | Status: AC
  Administered 2021-07-04: 1000 ug via INTRAMUSCULAR

## 2021-07-04 NOTE — Progress Notes (Signed)
Patient presented for B 12 injection to left deltoid, patient voiced no concerns nor showed any signs of distress during injection. First Of 4 weekly injections see lab note dated 06/18/2021

## 2021-07-11 ENCOUNTER — Ambulatory Visit: Payer: Medicare Other

## 2021-07-11 ENCOUNTER — Ambulatory Visit (INDEPENDENT_AMBULATORY_CARE_PROVIDER_SITE_OTHER): Payer: Medicare Other

## 2021-07-11 ENCOUNTER — Other Ambulatory Visit: Payer: Self-pay

## 2021-07-11 DIAGNOSIS — E538 Deficiency of other specified B group vitamins: Secondary | ICD-10-CM

## 2021-07-11 MED ORDER — CYANOCOBALAMIN 1000 MCG/ML IJ SOLN
1000.0000 ug | Freq: Once | INTRAMUSCULAR | Status: AC
  Administered 2021-07-11: 1000 ug via INTRAMUSCULAR

## 2021-07-11 NOTE — Progress Notes (Signed)
Patient presented for B 12 injection to right deltoid, patient voiced no concerns nor showed any signs of distress during injection. 

## 2021-07-18 ENCOUNTER — Ambulatory Visit (INDEPENDENT_AMBULATORY_CARE_PROVIDER_SITE_OTHER): Payer: Medicare Other | Admitting: *Deleted

## 2021-07-18 ENCOUNTER — Other Ambulatory Visit: Payer: Self-pay

## 2021-07-18 DIAGNOSIS — E538 Deficiency of other specified B group vitamins: Secondary | ICD-10-CM | POA: Diagnosis not present

## 2021-07-18 DIAGNOSIS — H40153 Residual stage of open-angle glaucoma, bilateral: Secondary | ICD-10-CM | POA: Diagnosis not present

## 2021-07-18 MED ORDER — CYANOCOBALAMIN 1000 MCG/ML IJ SOLN
1000.0000 ug | Freq: Once | INTRAMUSCULAR | Status: AC
  Administered 2021-07-18: 1000 ug via INTRAMUSCULAR

## 2021-07-18 NOTE — Progress Notes (Signed)
Pt arrived for 3/4 B12 injection, given in L deltoid. Pt tolerated injection well, showed no signs of distress nor voiced any concerns.  

## 2021-07-25 ENCOUNTER — Ambulatory Visit: Payer: Medicare Other

## 2021-07-25 ENCOUNTER — Other Ambulatory Visit: Payer: Self-pay

## 2021-07-25 ENCOUNTER — Ambulatory Visit (INDEPENDENT_AMBULATORY_CARE_PROVIDER_SITE_OTHER): Payer: Medicare Other | Admitting: *Deleted

## 2021-07-25 DIAGNOSIS — E538 Deficiency of other specified B group vitamins: Secondary | ICD-10-CM | POA: Diagnosis not present

## 2021-07-25 DIAGNOSIS — F0390 Unspecified dementia without behavioral disturbance: Secondary | ICD-10-CM

## 2021-07-25 DIAGNOSIS — I480 Paroxysmal atrial fibrillation: Secondary | ICD-10-CM

## 2021-07-25 MED ORDER — CYANOCOBALAMIN 1000 MCG/ML IJ SOLN
1000.0000 ug | Freq: Once | INTRAMUSCULAR | Status: AC
  Administered 2021-07-25: 1000 ug via INTRAMUSCULAR

## 2021-07-25 NOTE — Progress Notes (Signed)
Patient presented for B 12 injection to right deltoid, patient voiced no concerns nor showed any signs of distress during injection.  Noticed while reading chart that patient has not had labs for the Intrinsic factor completed so I have arranged for next visit. ?

## 2021-07-26 NOTE — Patient Instructions (Signed)
Visit Information ? ?Thank you for taking time to visit with me today. Please don't hesitate to contact me if I can be of assistance to you before our next scheduled telephone appointment. ? ?Following are the goals we discussed today:  ?- begin a notebook of services in my neighborhood or community ?- follow-up on any referrals for help I am given ?- think ahead to make sure my need does not become an emergency ?- have a back-up plan ? ?Our next appointment is by telephone on 08/08/21 at 10:30am ? ?Please call the care guide team at [phone] if you need to cancel or reschedule your appointment.  ? ?If you are experiencing a Mental Health or [place] or need someone to talk to, please call the Suicide and Crisis Lifeline: 988  ? ?Following is a copy of your full plan of care:  ?Care Plan : General Social Work (Adult)  ?Updates made by Bridget Claude, LCSW since 07/26/2021 12:00 AM  ?  ? ?Problem: CHL AMB "PATIENT-SPECIFIC PROBLEM"   ?Note:   ?CARE PLAN ENTRY ?(see longitudinal plan of care for additional care plan information) ? ?Current Barriers:  ?Patient with Dementia in need of assistance with connection to community resources  ?Knowledge deficits and need for support, education and care coordination related to community resources support  ?Limited social support, ADL IADL limitations, Memory Deficits, and Lacks knowledge of community resource: related to in home care needs and Day Program possibilities ? ?Clinical Goal(s)  ?Over the next 90 days, patient's son will work with care management team member to address concerns related to in home care needs ? ?Interventions provided by LCSW:  ?Assessed patient's care coordination needs related to in home care needs and discussed ongoing care management follow up  ?Confirmed with son that patient lives alone, son resides out of town however visits weekly to assist with care-patient confirmed to have a Life Alert system, pill box, and is able to  prepare small meals ?Patient's son discussed  concern that patient's condition continues to progress, additional assistance may be needed due progressing memory deficits(forgetting to lock front door, missed dosages of medication, hygiene) ?Confirmed that patient had Meals on Wheels in the past, however discontinued-socially attends church and goes to lunch regularly with family friend ?Provided patient's son with information about the possibility of patient attending a Adult Day Program([facility], [facility]) during the day or hiring a private duty aid for additional in home supervision-patient's son would like to keep patient home as long as possible  ?Email provided [email or web address] to send information to for review ?Advised patient's son  to review information received  ?Collaborated with appropriate clinical care team members regarding patient needs ?Solution-Focused Strategies employed:  ?Active listening / Reflection utilized  ?Caregiver stress acknowledged   ? ?Patient Self Care Activities & Deficits:  ?Patient is unable to independently navigate community resource options without care coordination support  ?Acknowledges deficits and is motivated to resolve concern  ?Does not adhere to prescribed medication regimen ?Unable to perform IADLs independently ?Attends all scheduled provider appointments ?Strong support from son ? ?Initial goal documentation ? ? ?  ? ? ?Bridget Briggs was given information about Care Management services by the embedded care coordination team including:  ?Care Management services include personalized support from designated clinical staff supervised by her physician, including individualized plan of care and coordination with other care providers ?24/7 contact phone numbers for assistance for urgent and routine care needs. ?The patient  may stop CCM services at any time (effective at the end of the month) by phone call to the office staff. ? ?Patient agreed  to services and verbal consent obtained.  ? ?Patient verbalizes understanding of instructions and care plan provided today and agrees to view in [facility]. Active MyChart status confirmed with patient.   ? ?Telephone follow up appointment with care management team member scheduled for: 08/08/21 ? ?Lejon Afzal, LCSW ?[place] ?[phone] ? ? ?  ?

## 2021-07-26 NOTE — Chronic Care Management (AMB) (Signed)
?Chronic Care Management  ? ? Clinical Social Work Note ? ?07/26/2021 ?Name: Bridget Briggs MRN: [id number] DOB: 1930-08-15 ? ?Bridget Briggs is a 88 y.o. year old female who is a primary care patient of Einar Pheasant, MD. The CCM team was consulted to assist the patient with chronic disease management and/or care coordination needs related to: [employer] .  ? ?Collaboration with patient;s son [name]  for initial visit in response to provider referral for social work chronic care management and care coordination services.  ? ?Consent to Services:  ?The patient was given the following information about Chronic Care Management services today, agreed to services, and gave verbal consent: 1. CCM service includes personalized support from designated clinical staff supervised by the primary care provider, including individualized plan of care and coordination with other care providers 2. 24/7 contact phone numbers for assistance for urgent and routine care needs. 3. Service will only be billed when office clinical staff spend 20 minutes or more in a month to coordinate care. 4. Only one practitioner may furnish and bill the service in a calendar month. 5.The patient may stop CCM services at any time (effective at the end of the month) by phone call to the office staff. 6. The patient will be responsible for cost sharing (co-pay) of up to 20% of the service fee (after annual deductible is met). Patient agreed to services and consent obtained. ? ?Patient agreed to services and consent obtained.  ? ?Assessment: Review of patient past medical history, allergies, medications, and health status, including review of relevant consultants reports was performed today as part of a comprehensive evaluation and provision of chronic care management and care coordination services.    ? ?SDOH (Social Determinants of Health) assessments and interventions performed:   ? ?Advanced Directives Status: Not addressed in this  encounter. ? ?CCM Care Plan ? ?Allergies  ?Allergen Reactions  ? Penicillins Other (See Comments)  ?  Has patient had a PCN reaction causing immediate rash, facial/tongue/throat swelling, SOB or lightheadedness with hypotension: no  ?Has patient had a PCN reaction causing severe rash involving mucus membranes or skin necrosis: no ?Has patient had a PCN reaction that required hospitalization: no ?Has patient had a PCN reaction occurring within the last 10 years: yes  ?If all of the above answers are "NO", then may proceed with Cephalosporin use. ?  ? ? ?Outpatient Encounter Medications as of 07/25/2021  ?Medication Sig  ? dorzolamide-timolol (COSOPT) 22.3-6.8 MG/ML ophthalmic solution Place 1 drop into both eyes 2 (two) times daily.  ? ELIQUIS 5 MG TABS tablet TAKE 1 TABLET(5 MG) BY MOUTH TWICE DAILY  ? latanoprost (XALATAN) 0.005 % ophthalmic solution Place 1 drop into both eyes at bedtime.  ? metoprolol tartrate (LOPRESSOR) 25 MG tablet Take 12.5 mg by mouth 2 (two) times daily.   ? rosuvastatin (CRESTOR) 5 MG tablet Take 1 tablet (5 mg total) by mouth daily.  ? VYZULTA 0.024 % SOLN Apply 1 drop to eye at bedtime.  ? ?No facility-administered encounter medications on file as of 07/25/2021.  ? ? ?Patient Active Problem List  ? Diagnosis Date Noted  ? Other thrombophilia ([place]) 10/16/2020  ? Cerebral atrophy ([place]) 07/15/2020  ? Dementia ([place]) 04/16/2020  ? Diaphoresis 03/13/2020  ? Change in hearing 01/17/2020  ? Balance problem 01/17/2020  ? Change in vision 11/26/2019  ? Hypercholesterolemia 07/01/2019  ? Elevated TSH 07/02/2018  ? Right hip pain 07/02/2018  ? Sweating abnormality 12/28/2017  ? Aortic atherosclerosis ([place])  10/29/2016  ? B12 deficiency 10/29/2015  ? Anemia 08/28/2015  ? Cough 06/28/2015  ? Paroxysmal atrial fibrillation ([place]) 06/08/2015  ? Left hip pain 04/05/2015  ? 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 Pain of left thumb 08/06/2014  ? Health care maintenance 08/06/2014  ? Osteoporosis 07/28/2014   ? Bronchiectasis without acute exacerbation ([place]) 01/20/2014  ? Multiple pulmonary nodules 01/20/2014  ? Abnormal CXR 11/02/2013  ? Chest pain 11/01/2013  ? Fatigue 11/01/2013  ? Burning sensation in lower extremity 11/01/2013  ? Environmental allergies 04/19/2013  ? Arthritis 01/18/2013  ? Depression 01/18/2013  ? GERD (gastroesophageal reflux disease) 01/18/2013  ? Glaucoma 01/18/2013  ? History of colonic polyps 01/18/2013  ? Carotid artery disease ([place]) 01/18/2013  ? ? ?Conditions to be addressed/monitored: Dementia; ADL IADL limitations and Memory Deficits ? ?Care Plan : General Social Work (Adult)  ?Updates made by Vern Claude, LCSW since 07/26/2021 12:00 AM  ?  ? ?Problem: CHL AMB "PATIENT-SPECIFIC PROBLEM"   ?Note:   ?CARE PLAN ENTRY ?(see longitudinal plan of care for additional care plan information) ? ?Current Barriers:  ?Patient with Dementia in need of assistance with connection to community resources  ?Knowledge deficits and need for support, education and care coordination related to community resources support  ?Limited social support, ADL IADL limitations, Memory Deficits, and Lacks knowledge of community resource: related to in home care needs and Day Program possibilities ? ?Clinical Goal(s)  ?Over the next 90 days, patient's son will work with care management team member to address concerns related to in home care needs ? ?Interventions provided by LCSW:  ?Assessed patient's care coordination needs related to in home care needs and discussed ongoing care management follow up  ?Confirmed with son that patient lives alone, son resides out of town however visits weekly to assist with care-patient confirmed to have a Life Alert system, pill box, and is able to prepare small meals ?Patient's son discussed  concern that patient's condition continues to progress, additional assistance may be needed due progressing memory deficits(forgetting to lock front door, missed dosages of medication,  hygiene) ?Confirmed that patient had Meals on Wheels in the past, however discontinued-socially attends church and goes to lunch regularly with family friend ?Provided patient's son with information about the possibility of patient attending a Adult Day Program([facility], [facility]) during the day or hiring a private duty aid for additional in home supervision-patient's son would like to keep patient home as long as possible  ?Email provided [email or web address] to send information to for review ?Advised patient's son  to review information received  ?Collaborated with appropriate clinical care team members regarding patient needs ?Solution-Focused Strategies employed:  ?Active listening / Reflection utilized  ?Caregiver stress acknowledged   ? ?Patient Self Care Activities & Deficits:  ?Patient is unable to independently navigate community resource options without care coordination support  ?Acknowledges deficits and is motivated to resolve concern  ?Does not adhere to prescribed medication regimen ?Unable to perform IADLs independently ?Attends all scheduled provider appointments ?Strong support from son ? ?Initial goal documentation ? ? ?  ?  ? ?Follow Up Plan: SW will follow up with patient by phone over the next 14 business days ?   ? ?  Lajune Perine, LCSW ?[place] ?[phone]  ? ? ?

## 2021-08-08 ENCOUNTER — Ambulatory Visit: Payer: Medicare Other | Admitting: *Deleted

## 2021-08-08 DIAGNOSIS — E7800 Pure hypercholesterolemia, unspecified: Secondary | ICD-10-CM

## 2021-08-08 DIAGNOSIS — F0390 Unspecified dementia without behavioral disturbance: Secondary | ICD-10-CM

## 2021-08-08 NOTE — Chronic Care Management (AMB) (Signed)
?Chronic Care Management  ? ? Clinical Social Work Note ? ?08/08/2021 ?Name: Bridget Briggs MRN: [id number] DOB: 12-08-1930 ? ?Bridget Briggs is a 88 y.o. year old female who is a primary care patient of Einar Pheasant, MD. The CCM team was consulted to assist the patient with chronic disease management and/or care coordination needs related to: [employer] .  ? ?Engaged with patient by telephone for follow up visit in response to provider referral for social work chronic care management and care coordination services.  ? ?Consent to Services:  ?The patient was given information about Chronic Care Management services, agreed to services, and gave verbal consent prior to initiation of services.  Please see initial visit note for detailed documentation.  ? ?Patient agreed to services and consent obtained.  ? ?Assessment: Review of patient past medical history, allergies, medications, and health status, including review of relevant consultants reports was performed today as part of a comprehensive evaluation and provision of chronic care management and care coordination services.    ? ?SDOH (Social Determinants of Health) assessments and interventions performed:   ? ?Advanced Directives Status: Not addressed in this encounter. ? ?CCM Care Plan ? ?Allergies  ?Allergen Reactions  ? Penicillins Other (See Comments)  ?  Has patient had a PCN reaction causing immediate rash, facial/tongue/throat swelling, SOB or lightheadedness with hypotension: no  ?Has patient had a PCN reaction causing severe rash involving mucus membranes or skin necrosis: no ?Has patient had a PCN reaction that required hospitalization: no ?Has patient had a PCN reaction occurring within the last 10 years: yes  ?If all of the above answers are "NO", then may proceed with Cephalosporin use. ?  ? ? ?Outpatient Encounter Medications as of 08/08/2021  ?Medication Sig  ? dorzolamide-timolol (COSOPT) 22.3-6.8 MG/ML ophthalmic solution Place 1 drop  into both eyes 2 (two) times daily.  ? ELIQUIS 5 MG TABS tablet TAKE 1 TABLET(5 MG) BY MOUTH TWICE DAILY  ? latanoprost (XALATAN) 0.005 % ophthalmic solution Place 1 drop into both eyes at bedtime.  ? metoprolol tartrate (LOPRESSOR) 25 MG tablet Take 12.5 mg by mouth 2 (two) times daily.   ? rosuvastatin (CRESTOR) 5 MG tablet Take 1 tablet (5 mg total) by mouth daily.  ? VYZULTA 0.024 % SOLN Apply 1 drop to eye at bedtime.  ? ?No facility-administered encounter medications on file as of 08/08/2021.  ? ? ?Patient Active Problem List  ? Diagnosis Date Noted  ? Other thrombophilia ([place]) 10/16/2020  ? Cerebral atrophy ([place]) 07/15/2020  ? Dementia ([place]) 04/16/2020  ? Diaphoresis 03/13/2020  ? Change in hearing 01/17/2020  ? Balance problem 01/17/2020  ? Change in vision 11/26/2019  ? Hypercholesterolemia 07/01/2019  ? Elevated TSH 07/02/2018  ? Right hip pain 07/02/2018  ? Sweating abnormality 12/28/2017  ? Aortic atherosclerosis ([place]) 10/29/2016  ? B12 deficiency 10/29/2015  ? Anemia 08/28/2015  ? Cough 06/28/2015  ? Paroxysmal atrial fibrillation ([place]) 06/08/2015  ? Left hip pain 04/05/2015  ? 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 Pain of left thumb 08/06/2014  ? Health care maintenance 08/06/2014  ? Osteoporosis 07/28/2014  ? Bronchiectasis without acute exacerbation ([place]) 01/20/2014  ? Multiple pulmonary nodules 01/20/2014  ? Abnormal CXR 11/02/2013  ? Chest pain 11/01/2013  ? Fatigue 11/01/2013  ? Burning sensation in lower extremity 11/01/2013  ? Environmental allergies 04/19/2013  ? Arthritis 01/18/2013  ? Depression 01/18/2013  ? GERD (gastroesophageal reflux disease) 01/18/2013  ? Glaucoma 01/18/2013  ? History of colonic polyps 01/18/2013  ?  Carotid artery disease ([place]) 01/18/2013  ? ? ?Conditions to be addressed/monitored: Dementia; Memory Deficits ? ?Care Plan : General Social Work (Adult)  ?Updates made by Vern Claude, LCSW since 08/08/2021 12:00 AM  ?  ? ?Problem: CHL AMB "PATIENT-SPECIFIC PROBLEM"    ?Note:   ?CARE PLAN ENTRY ?(see longitudinal plan of care for additional care plan information) ? ?Current Barriers:  ?Patient with Dementia in need of assistance with connection to community resources  ?Knowledge deficits and need for support, education and care coordination related to community resources support  ?Limited social support, ADL IADL limitations, Memory Deficits, and Lacks knowledge of community resource: related to in home care needs and Day Program possibilities ? ?Clinical Goal(s)  ?Over the next 90 days, patient's son will work with care management team member to address concerns related to in home care needs ? ?Interventions provided by LCSW:  ?Continued to assess patient's care coordination needs related to in home care needs and discussed ongoing care management follow up  ?Confirmed with patient's son that patient continues to live alone with the assistance of her son who resides out of town however visits weekly to assist with care-patient previously confirmed to have a Life Alert system, pill box, and is able to prepare small meals ?Followed up on information provided on  Adult Day Program([facility], [place] [facility] program) as well as available in home agencies-patient's son continues to have desire to keep patient home as long as possible  ?Email confirmed-[email or web address] above resources emailed to patient's son-additional private duty agency list provided for agencies that do not require a minimum  ?Advised patient's son  to review information received  ?Collaborated with appropriate clinical care team members regarding patient needs ?Solution-Focused Strategies employed:  ?Active listening / Reflection utilized  ?Caregiver stress acknowledged   ? ?Patient Self Care Activities & Deficits:  ?Patient is unable to independently navigate community resource options without care coordination support  ?Acknowledges deficits and is motivated to resolve concern  ?Does not  adhere to prescribed medication regimen ?Unable to perform IADLs independently ?Attends all scheduled provider appointments ?Strong support from son ? ?Please see past updates related to this goal by clicking on the "Past Updates" button in the selected goal  ? ? ?  ?  ? ?Follow Up Plan: SW will follow up with patient by phone over the next 30 business days ?     ?Travers Goodley, LCSW ?[place] ?[phone] ? ? ? ?

## 2021-08-08 NOTE — Patient Instructions (Signed)
Visit Information ? ?Thank you for taking time to visit with me today. Please don't hesitate to contact me if I can be of assistance to you before our next scheduled telephone appointment. ? ?Following are the goals we discussed today:  ?- begin a notebook of services in my neighborhood or community ?- follow-up on any referrals for help I am given ?- think ahead to make sure my need does not become an emergency ?- have a back-up plan ? ?Our next appointment is by telephone on 08/22/21 at 1pm ? ?Please call the care guide team at [phone] if you need to cancel or reschedule your appointment.  ? ?If you are experiencing a Mental Health or [place] or need someone to talk to, please call the Suicide and Crisis Lifeline: 988  ? ?Patient verbalizes understanding of instructions and care plan provided today and agrees to view in [place]. Active MyChart status confirmed with patient.   ? ?Telephone follow up appointment with care management team member scheduled for: 08/22/21 ? ?Josefine Fuhr, LCSW ?[place] ?[phone] ? ?

## 2021-08-16 DIAGNOSIS — I700 Atherosclerosis of aorta: Secondary | ICD-10-CM | POA: Diagnosis not present

## 2021-08-16 DIAGNOSIS — E7800 Pure hypercholesterolemia, unspecified: Secondary | ICD-10-CM | POA: Diagnosis not present

## 2021-08-16 DIAGNOSIS — I208 Other forms of angina pectoris: Secondary | ICD-10-CM | POA: Diagnosis not present

## 2021-08-16 DIAGNOSIS — I480 Paroxysmal atrial fibrillation: Secondary | ICD-10-CM | POA: Diagnosis not present

## 2021-08-17 DIAGNOSIS — F0390 Unspecified dementia without behavioral disturbance: Secondary | ICD-10-CM

## 2021-08-17 DIAGNOSIS — E7800 Pure hypercholesterolemia, unspecified: Secondary | ICD-10-CM

## 2021-08-22 ENCOUNTER — Telehealth: Payer: Self-pay | Admitting: *Deleted

## 2021-08-22 ENCOUNTER — Telehealth: Payer: Medicare Other

## 2021-08-22 NOTE — Telephone Encounter (Signed)
?  Care Management  ? ?Follow Up Note ? ? ?08/22/2021 ?Name: Bridget Briggs MRN: [id number] DOB: May 12, 1931 ? ? ?Referred by: Einar Pheasant, MD ?Reason for referral : Care Coordination ? ? ?Phone call to patient's son to confirm and discuss resources sent for Day Programs and in home care. Patient's son requested that the appointment be re-scheduled. ? ?Follow Up Plan: Telephone follow up appointment with care management team member scheduled for: 09/05/21 ? ?Ismelda Weatherman, LCSW ?[place] ?[phone] ? ?

## 2021-08-30 ENCOUNTER — Ambulatory Visit (INDEPENDENT_AMBULATORY_CARE_PROVIDER_SITE_OTHER): Payer: Medicare Other

## 2021-08-30 DIAGNOSIS — R944 Abnormal results of kidney function studies: Secondary | ICD-10-CM

## 2021-08-30 DIAGNOSIS — E538 Deficiency of other specified B group vitamins: Secondary | ICD-10-CM

## 2021-08-30 LAB — BASIC METABOLIC PANEL
BUN: 14 mg/dL (ref 6–23)
CO2: 25 mEq/L (ref 19–32)
Calcium: 9.3 mg/dL (ref 8.4–10.5)
Chloride: 102 mEq/L (ref 96–112)
Creatinine, Ser: 0.93 mg/dL (ref 0.40–1.20)
GFR: 54.85 mL/min — ABNORMAL LOW (ref >60.00)
Glucose, Bld: 96 mg/dL (ref 70–99)
Potassium: 4 mEq/L (ref 3.5–5.1)
Sodium: 134 mEq/L — ABNORMAL LOW (ref 135–145)

## 2021-08-30 MED ORDER — CYANOCOBALAMIN 1000 MCG/ML IJ SOLN
1000.0000 ug | Freq: Once | INTRAMUSCULAR | Status: AC
  Administered 2021-08-30: 1000 ug via INTRAMUSCULAR

## 2021-08-30 NOTE — Progress Notes (Signed)
Bridget Briggs presents today for injection per MD orders. ?B12 injection administered IM in right Upper Arm. ?Administration without incident. ?Patient tolerated well.  Olvin Rohr,cma  ? ?

## 2021-09-05 ENCOUNTER — Telehealth: Payer: Medicare Other

## 2021-09-05 ENCOUNTER — Telehealth: Payer: Self-pay | Admitting: *Deleted

## 2021-09-05 ENCOUNTER — Ambulatory Visit (INDEPENDENT_AMBULATORY_CARE_PROVIDER_SITE_OTHER): Payer: Medicare Other | Admitting: *Deleted

## 2021-09-05 DIAGNOSIS — E7800 Pure hypercholesterolemia, unspecified: Secondary | ICD-10-CM

## 2021-09-05 DIAGNOSIS — F0390 Unspecified dementia without behavioral disturbance: Secondary | ICD-10-CM

## 2021-09-05 NOTE — Telephone Encounter (Signed)
?  Care Management  ? ?Follow Up Note ? ? ?09/05/2021 ?Name: Bridget Briggs MRN: [id number] DOB: 12-16-1930 ? ? ?Referred by: Einar Pheasant, MD ?Reason for referral : Care Coordination ? ? ?An unsuccessful telephone outreach was attempted today. The patient was referred to the case management team for assistance with care management and care coordination.  ? ?Follow Up Plan: Telephone follow up appointment with care management team member scheduled for: 09/12/21 ? ? ?Kaprice Kage, LCSW ?[place] ?[phone] ? ? ?

## 2021-09-05 NOTE — Patient Instructions (Addendum)
Visit Information ? ?Thank you for taking time to visit with me today. Please don't hesitate to contact me if I can be of assistance to you before our next scheduled telephone appointment. ? ?Following are the goals we discussed today:  ?Please review community resources previously provided for future use(Day Programs and in home care options) ? ? ?If you are experiencing a Mental Health or [place] or need someone to talk to, please call the Suicide and Crisis Lifeline: 988  ? ?Patient verbalizes understanding of instructions and care plan provided today and agrees to view in [place]. Active MyChart status confirmed with patient.   ? ?No further follow up required: patient's son to contact this Education officer, museum with any additional community resource needs ? ?Bridget Cavness, LCSW ?[place] ?[phone] ? ?

## 2021-09-05 NOTE — Chronic Care Management (AMB) (Signed)
?Chronic Care Management  ? ? Clinical Social Work Note ? ?09/05/2021 ?Name: Bridget Briggs MRN: [id number] DOB: 13-Aug-1930 ? ?Bridget Briggs is a 88 y.o. year old female who is a primary care patient of Einar Pheasant, MD. The CCM team was consulted to assist the patient with chronic disease management and/or care coordination needs related to: [employer] .  ? ?Collaboration with patient's son by phone  for follow up visit in response to provider referral for social work chronic care management and care coordination services.  ? ?Consent to Services:  ?The patient was given information about Chronic Care Management services, agreed to services, and gave verbal consent prior to initiation of services.  Please see initial visit note for detailed documentation.  ? ?Patient agreed to services and consent obtained.  ? ?Assessment: Review of patient past medical history, allergies, medications, and health status, including review of relevant consultants reports was performed today as part of a comprehensive evaluation and provision of chronic care management and care coordination services.    ? ?SDOH (Social Determinants of Health) assessments and interventions performed:   ? ?Advanced Directives Status: Not addressed in this encounter. ? ?CCM Care Plan ? ?Allergies  ?Allergen Reactions  ? Penicillins Other (See Comments)  ?  Has patient had a PCN reaction causing immediate rash, facial/tongue/throat swelling, SOB or lightheadedness with hypotension: no  ?Has patient had a PCN reaction causing severe rash involving mucus membranes or skin necrosis: no ?Has patient had a PCN reaction that required hospitalization: no ?Has patient had a PCN reaction occurring within the last 10 years: yes  ?If all of the above answers are "NO", then may proceed with Cephalosporin use. ?  ? ? ?Outpatient Encounter Medications as of 09/05/2021  ?Medication Sig  ? dorzolamide-timolol (COSOPT) 22.3-6.8 MG/ML ophthalmic solution Place  1 drop into both eyes 2 (two) times daily.  ? ELIQUIS 5 MG TABS tablet TAKE 1 TABLET(5 MG) BY MOUTH TWICE DAILY  ? latanoprost (XALATAN) 0.005 % ophthalmic solution Place 1 drop into both eyes at bedtime.  ? metoprolol tartrate (LOPRESSOR) 25 MG tablet Take 12.5 mg by mouth 2 (two) times daily.   ? rosuvastatin (CRESTOR) 5 MG tablet Take 1 tablet (5 mg total) by mouth daily.  ? VYZULTA 0.024 % SOLN Apply 1 drop to eye at bedtime.  ? ?No facility-administered encounter medications on file as of 09/05/2021.  ? ? ?Patient Active Problem List  ? Diagnosis Date Noted  ? Other thrombophilia ([place]) 10/16/2020  ? Cerebral atrophy ([place]) 07/15/2020  ? Dementia ([place]) 04/16/2020  ? Diaphoresis 03/13/2020  ? Change in hearing 01/17/2020  ? Balance problem 01/17/2020  ? Change in vision 11/26/2019  ? Hypercholesterolemia 07/01/2019  ? Elevated TSH 07/02/2018  ? Right hip pain 07/02/2018  ? Sweating abnormality 12/28/2017  ? Aortic atherosclerosis ([place]) 10/29/2016  ? B12 deficiency 10/29/2015  ? Anemia 08/28/2015  ? Cough 06/28/2015  ? Paroxysmal atrial fibrillation ([place]) 06/08/2015  ? Left hip pain 04/05/2015  ? 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 Pain of left thumb 08/06/2014  ? Health care maintenance 08/06/2014  ? Osteoporosis 07/28/2014  ? Bronchiectasis without acute exacerbation ([place]) 01/20/2014  ? Multiple pulmonary nodules 01/20/2014  ? Abnormal CXR 11/02/2013  ? Chest pain 11/01/2013  ? Fatigue 11/01/2013  ? Burning sensation in lower extremity 11/01/2013  ? Environmental allergies 04/19/2013  ? Arthritis 01/18/2013  ? Depression 01/18/2013  ? GERD (gastroesophageal reflux disease) 01/18/2013  ? Glaucoma 01/18/2013  ? History of colonic  polyps 01/18/2013  ? Carotid artery disease ([place]) 01/18/2013  ? ? ?Conditions to be addressed/monitored: Dementia; Memory Deficits ? ?Care Plan : General Social Work (Adult)  ?Updates made by Vern Claude, LCSW since 09/05/2021 12:00 AM  ?  ? ?Problem: [facility] AMB "PATIENT-SPECIFIC  PROBLEM"   ?Note:   ?CARE PLAN ENTRY ?(see longitudinal plan of care for additional care plan information) ? ?Current Barriers:  ?Patient with Dementia in need of assistance with connection to community resources  ?Knowledge deficits and need for support, education and care coordination related to community resources support  ?Limited social support, ADL IADL limitations, Memory Deficits, and Lacks knowledge of community resource: related to in home care needs and Day Program possibilities ? ?Clinical Goal(s)  ?Over the next 90 days, patient's son will work with care management team member to address concerns related to in home care needs ? ?Interventions provided by LCSW:  ?Continued to assess patient's care coordination needs related to in home care needs and discussed ongoing care management follow up  ?Confirmed with patient's son that patient continues to live alone with the assistance of her son who resides out of town however visits weekly to assist with care-patient has a sister that visits 1x per week as well. Patient's son previously confirmed to have a Life Alert system, pill box, and is able to prepare small meals ?Followed up on information provided on  Adult Day Program([facility], [facility]) as well as available in home agencies-information received and will keep information for future use-patient's son continues to have desire to keep patient home as long as possible as she is happy ?Patient's son confirmed that he has noticed slight changes in patient's mobility and memory, however remains satisfied with plan of care for patient at this time ?Patient's son agreeable to reviewing information received related to Day Programs and in home care resources in the event that these resources are needed in the future ?Collaborated with appropriate clinical care team members regarding patient needs ?Solution-Focused Strategies employed:  ?Active listening / Reflection utilized    ? ?Patient Self Care Activities & Deficits:  ?Patient is unable to independently navigate community resource options without care coordination support  ?Acknowledges deficits and is motivated to resolve concern  ?Does not adhere to prescribed medication regimen ?Unable to perform IADLs independently ?Attends all scheduled provider appointments ?Strong support from son ? ?Please see past updates related to this goal by clicking on the "Past Updates" button in the selected goal  ? ? ?  ?  ? ?Follow Up Plan:  Client's son will contact this social worker with any additional community resource needs. ?     ?[employer], LCSW ?[place] ?[phone] ? ? ? ?

## 2021-09-12 ENCOUNTER — Telehealth: Payer: Medicare Other

## 2021-09-16 DIAGNOSIS — E7800 Pure hypercholesterolemia, unspecified: Secondary | ICD-10-CM

## 2021-09-16 DIAGNOSIS — F0390 Unspecified dementia without behavioral disturbance: Secondary | ICD-10-CM

## 2021-09-18 ENCOUNTER — Ambulatory Visit: Payer: Medicare Other | Admitting: Internal Medicine

## 2021-09-18 ENCOUNTER — Other Ambulatory Visit: Payer: Self-pay

## 2021-09-18 ENCOUNTER — Emergency Department: Payer: Medicare Other

## 2021-09-18 ENCOUNTER — Observation Stay
Admission: EM | Admit: 2021-09-18 | Discharge: 2021-09-21 | Disposition: A | Discharge status: Skilled Nursing Facility | Payer: Medicare Other | Attending: Internal Medicine | Admitting: Internal Medicine

## 2021-09-18 ENCOUNTER — Encounter: Payer: Self-pay | Admitting: Internal Medicine

## 2021-09-18 VITALS — BP 149/68 | HR 80 | Temp 99.4°F | Resp 17 | Ht 62.0 in | Wt 170.8 lb

## 2021-09-18 DIAGNOSIS — Z8619 Personal history of other infectious and parasitic diseases: Secondary | ICD-10-CM

## 2021-09-18 DIAGNOSIS — Z20822 Contact with and (suspected) exposure to covid-19: Secondary | ICD-10-CM | POA: Insufficient documentation

## 2021-09-18 DIAGNOSIS — I10 Essential (primary) hypertension: Secondary | ICD-10-CM | POA: Diagnosis not present

## 2021-09-18 DIAGNOSIS — R2689 Other abnormalities of gait and mobility: Secondary | ICD-10-CM | POA: Insufficient documentation

## 2021-09-18 DIAGNOSIS — M47816 Spondylosis without myelopathy or radiculopathy, lumbar region: Secondary | ICD-10-CM | POA: Diagnosis not present

## 2021-09-18 DIAGNOSIS — Y92008 Other place in unspecified non-institutional (private) residence as the place of occurrence of the external cause: Secondary | ICD-10-CM | POA: Diagnosis not present

## 2021-09-18 DIAGNOSIS — G9341 Metabolic encephalopathy: Secondary | ICD-10-CM | POA: Insufficient documentation

## 2021-09-18 DIAGNOSIS — F32A Depression, unspecified: Secondary | ICD-10-CM | POA: Insufficient documentation

## 2021-09-18 DIAGNOSIS — I4891 Unspecified atrial fibrillation: Secondary | ICD-10-CM

## 2021-09-18 DIAGNOSIS — Z90710 Acquired absence of both cervix and uterus: Secondary | ICD-10-CM | POA: Diagnosis not present

## 2021-09-18 DIAGNOSIS — I480 Paroxysmal atrial fibrillation: Secondary | ICD-10-CM

## 2021-09-18 DIAGNOSIS — A4151 Sepsis due to Escherichia coli [E. coli]: Secondary | ICD-10-CM | POA: Insufficient documentation

## 2021-09-18 DIAGNOSIS — J9811 Atelectasis: Secondary | ICD-10-CM | POA: Insufficient documentation

## 2021-09-18 DIAGNOSIS — I493 Ventricular premature depolarization: Secondary | ICD-10-CM | POA: Insufficient documentation

## 2021-09-18 DIAGNOSIS — Z90721 Acquired absence of ovaries, unilateral: Secondary | ICD-10-CM

## 2021-09-18 DIAGNOSIS — M169 Osteoarthritis of hip, unspecified: Secondary | ICD-10-CM | POA: Diagnosis not present

## 2021-09-18 DIAGNOSIS — I4819 Other persistent atrial fibrillation: Secondary | ICD-10-CM | POA: Diagnosis not present

## 2021-09-18 DIAGNOSIS — Z9181 History of falling: Secondary | ICD-10-CM | POA: Insufficient documentation

## 2021-09-18 DIAGNOSIS — Z602 Problems related to living alone: Secondary | ICD-10-CM | POA: Diagnosis not present

## 2021-09-18 DIAGNOSIS — Z7901 Long term (current) use of anticoagulants: Secondary | ICD-10-CM | POA: Insufficient documentation

## 2021-09-18 DIAGNOSIS — R7401 Elevation of levels of liver transaminase levels: Secondary | ICD-10-CM | POA: Insufficient documentation

## 2021-09-18 DIAGNOSIS — W19XXXA Unspecified fall, initial encounter: Secondary | ICD-10-CM | POA: Insufficient documentation

## 2021-09-18 DIAGNOSIS — M47812 Spondylosis without myelopathy or radiculopathy, cervical region: Secondary | ICD-10-CM | POA: Diagnosis not present

## 2021-09-18 DIAGNOSIS — Z79899 Other long term (current) drug therapy: Secondary | ICD-10-CM | POA: Insufficient documentation

## 2021-09-18 DIAGNOSIS — Z880 Allergy status to penicillin: Secondary | ICD-10-CM | POA: Diagnosis not present

## 2021-09-18 DIAGNOSIS — R4182 Altered mental status, unspecified: Secondary | ICD-10-CM | POA: Diagnosis not present

## 2021-09-18 DIAGNOSIS — D6869 Other thrombophilia: Secondary | ICD-10-CM

## 2021-09-18 DIAGNOSIS — J479 Bronchiectasis, uncomplicated: Secondary | ICD-10-CM

## 2021-09-18 DIAGNOSIS — Z86010 Personal history of colonic polyps: Secondary | ICD-10-CM

## 2021-09-18 DIAGNOSIS — M25552 Pain in left hip: Secondary | ICD-10-CM | POA: Insufficient documentation

## 2021-09-18 DIAGNOSIS — D72829 Elevated white blood cell count, unspecified: Secondary | ICD-10-CM | POA: Insufficient documentation

## 2021-09-18 DIAGNOSIS — M1990 Unspecified osteoarthritis, unspecified site: Secondary | ICD-10-CM | POA: Diagnosis present

## 2021-09-18 DIAGNOSIS — E872 Acidosis, unspecified: Secondary | ICD-10-CM | POA: Insufficient documentation

## 2021-09-18 DIAGNOSIS — R000 Tachycardia, unspecified: Secondary | ICD-10-CM | POA: Insufficient documentation

## 2021-09-18 DIAGNOSIS — E7800 Pure hypercholesterolemia, unspecified: Secondary | ICD-10-CM

## 2021-09-18 DIAGNOSIS — Z9049 Acquired absence of other specified parts of digestive tract: Secondary | ICD-10-CM

## 2021-09-18 DIAGNOSIS — I6782 Cerebral ischemia: Secondary | ICD-10-CM | POA: Diagnosis not present

## 2021-09-18 DIAGNOSIS — N390 Urinary tract infection, site not specified: Secondary | ICD-10-CM | POA: Diagnosis not present

## 2021-09-18 DIAGNOSIS — F0390 Unspecified dementia without behavioral disturbance: Secondary | ICD-10-CM | POA: Insufficient documentation

## 2021-09-18 DIAGNOSIS — Z043 Encounter for examination and observation following other accident: Secondary | ICD-10-CM | POA: Diagnosis not present

## 2021-09-18 DIAGNOSIS — R296 Repeated falls: Secondary | ICD-10-CM | POA: Insufficient documentation

## 2021-09-18 DIAGNOSIS — I700 Atherosclerosis of aorta: Secondary | ICD-10-CM | POA: Diagnosis not present

## 2021-09-18 DIAGNOSIS — R410 Disorientation, unspecified: Secondary | ICD-10-CM

## 2021-09-18 DIAGNOSIS — Z87440 Personal history of urinary (tract) infections: Secondary | ICD-10-CM | POA: Diagnosis not present

## 2021-09-18 DIAGNOSIS — R9431 Abnormal electrocardiogram [ECG] [EKG]: Secondary | ICD-10-CM | POA: Insufficient documentation

## 2021-09-18 DIAGNOSIS — Z87891 Personal history of nicotine dependence: Secondary | ICD-10-CM

## 2021-09-18 DIAGNOSIS — I779 Disorder of arteries and arterioles, unspecified: Secondary | ICD-10-CM

## 2021-09-18 DIAGNOSIS — F0393 Unspecified dementia, unspecified severity, with mood disturbance: Secondary | ICD-10-CM | POA: Insufficient documentation

## 2021-09-18 DIAGNOSIS — K219 Gastro-esophageal reflux disease without esophagitis: Secondary | ICD-10-CM | POA: Diagnosis not present

## 2021-09-18 DIAGNOSIS — A419 Sepsis, unspecified organism: Principal | ICD-10-CM | POA: Diagnosis present

## 2021-09-18 DIAGNOSIS — H409 Unspecified glaucoma: Secondary | ICD-10-CM | POA: Diagnosis present

## 2021-09-18 DIAGNOSIS — Z8261 Family history of arthritis: Secondary | ICD-10-CM

## 2021-09-18 DIAGNOSIS — R6889 Other general symptoms and signs: Secondary | ICD-10-CM | POA: Diagnosis not present

## 2021-09-18 DIAGNOSIS — M6281 Muscle weakness (generalized): Secondary | ICD-10-CM | POA: Insufficient documentation

## 2021-09-18 DIAGNOSIS — R2681 Unsteadiness on feet: Secondary | ICD-10-CM | POA: Insufficient documentation

## 2021-09-18 LAB — URINALYSIS, ROUTINE W REFLEX MICROSCOPIC
Bilirubin Urine: NEGATIVE
Glucose, UA: NEGATIVE mg/dL
Ketones, ur: 5 mg/dL — AB
Leukocytes,Ua: NEGATIVE
Nitrite: NEGATIVE
Protein, ur: 30 mg/dL — AB
Specific Gravity, Urine: 1.021 (ref 1.005–1.030)
pH: 5 (ref 5.0–8.0)

## 2021-09-18 LAB — COMPREHENSIVE METABOLIC PANEL
ALT: 26 U/L (ref 0–44)
AST: 70 U/L — ABNORMAL HIGH (ref 15–41)
Albumin: 4.3 g/dL (ref 3.5–5.0)
Alkaline Phosphatase: 51 U/L (ref 38–126)
Anion gap: 10 (ref 5–15)
BUN: 23 mg/dL (ref 8–23)
CO2: 23 mmol/L (ref 22–32)
Calcium: 10 mg/dL (ref 8.9–10.3)
Chloride: 104 mmol/L (ref 98–111)
Creatinine, Ser: 0.95 mg/dL (ref 0.44–1.00)
GFR, Estimated: 58 mL/min — ABNORMAL LOW (ref >60)
Glucose, Bld: 109 mg/dL — ABNORMAL HIGH (ref 70–99)
Potassium: 3.5 mmol/L (ref 3.5–5.1)
Sodium: 137 mmol/L (ref 135–145)
Total Bilirubin: 2.3 mg/dL — ABNORMAL HIGH (ref 0.3–1.2)
Total Protein: 7.2 g/dL (ref 6.5–8.1)

## 2021-09-18 LAB — CBC
HCT: 49.9 % — ABNORMAL HIGH (ref 36.0–46.0)
Hemoglobin: 15.8 g/dL — ABNORMAL HIGH (ref 12.0–15.0)
MCH: 28.6 pg (ref 26.0–34.0)
MCHC: 31.7 g/dL (ref 30.0–36.0)
MCV: 90.2 fL (ref 80.0–100.0)
Platelets: 225 10*3/uL (ref 150–400)
RBC: 5.53 MIL/uL — ABNORMAL HIGH (ref 3.87–5.11)
RDW: 13.9 % (ref 11.5–15.5)
WBC: 16.7 10*3/uL — ABNORMAL HIGH (ref 4.0–10.5)
nRBC: 0 % (ref 0.0–0.2)

## 2021-09-18 LAB — RESP PANEL BY RT-PCR (FLU A&B, COVID) ARPGX2
Influenza A by PCR: NEGATIVE
Influenza B by PCR: NEGATIVE
SARS Coronavirus 2 by RT PCR: NEGATIVE

## 2021-09-18 LAB — BLOOD GAS, VENOUS
Acid-Base Excess: 0.1 mmol/L (ref 0.0–2.0)
Bicarbonate: 24.7 mmol/L (ref 20.0–28.0)
O2 Saturation: 32.9 %
Patient temperature: 37
pCO2, Ven: 39 mmHg — ABNORMAL LOW (ref 44–60)
pH, Ven: 7.41 (ref 7.25–7.43)
pO2, Ven: <31 mmHg — CL (ref 32–45)

## 2021-09-18 LAB — LACTIC ACID, PLASMA
Lactic Acid, Venous: 3.8 mmol/L (ref 0.5–1.9)
Lactic Acid, Venous: 1.8 mmol/L (ref 0.5–1.9)
Lactic Acid, Venous: 1.4 mmol/L (ref 0.5–1.9)
Lactic Acid, Venous: 3 mmol/L (ref 0.5–1.9)

## 2021-09-18 MED ORDER — METOPROLOL TARTRATE 25 MG PO TABS
12.5000 mg | ORAL_TABLET | Freq: Two times a day (BID) | ORAL | Status: DC
  Administered 2021-09-18 – 2021-09-19 (×3): 12.5 mg via ORAL
  Filled 2021-09-18 (×3): qty 1

## 2021-09-18 MED ORDER — DILTIAZEM HCL 25 MG/5ML IV SOLN
5.0000 mg | Freq: Once | INTRAVENOUS | Status: AC
  Administered 2021-09-18: 5 mg via INTRAVENOUS
  Filled 2021-09-18: qty 5

## 2021-09-18 MED ORDER — ACETAMINOPHEN 325 MG PO TABS: 650.0000 mg | ORAL_TABLET | Freq: Four times a day (QID) | ORAL | Status: DC | PRN

## 2021-09-18 MED ORDER — ONDANSETRON HCL 4 MG/2ML IJ SOLN: 4.0000 mg | Freq: Four times a day (QID) | INTRAMUSCULAR | Status: DC | PRN

## 2021-09-18 MED ORDER — APIXABAN 5 MG PO TABS
5.0000 mg | ORAL_TABLET | Freq: Two times a day (BID) | ORAL | Status: DC
  Administered 2021-09-18 – 2021-09-21 (×6): 5 mg via ORAL
  Filled 2021-09-18 (×6): qty 1

## 2021-09-18 MED ORDER — ACETAMINOPHEN 650 MG RE SUPP: 650.0000 mg | Freq: Four times a day (QID) | RECTAL | Status: DC | PRN

## 2021-09-18 MED ORDER — METRONIDAZOLE 500 MG/100ML IV SOLN
500.0000 mg | Freq: Once | INTRAVENOUS | Status: AC
  Administered 2021-09-18: 500 mg via INTRAVENOUS
  Filled 2021-09-18: qty 100

## 2021-09-18 MED ORDER — LACTATED RINGERS IV BOLUS
500.0000 mL | Freq: Once | INTRAVENOUS | Status: AC
Start: 2021-09-18 — End: 2021-09-18
  Administered 2021-09-18: 500 mL via INTRAVENOUS

## 2021-09-18 MED ORDER — DILTIAZEM HCL-DEXTROSE 125-5 MG/125ML-% IV SOLN (PREMIX)
5.0000 mg/h | INTRAVENOUS | Status: DC
  Administered 2021-09-18: 5 mg/h via INTRAVENOUS
  Filled 2021-09-18: qty 125

## 2021-09-18 MED ORDER — SODIUM CHLORIDE 0.9 % IV BOLUS
1000.0000 mL | Freq: Once | INTRAVENOUS | Status: AC
  Administered 2021-09-18: 1000 mL via INTRAVENOUS

## 2021-09-18 MED ORDER — SODIUM CHLORIDE 0.9 % IV BOLUS
1000.0000 mL | Freq: Once | INTRAVENOUS | Status: AC
Start: 2021-09-18 — End: 2021-09-18
  Administered 2021-09-18: 1000 mL via INTRAVENOUS

## 2021-09-18 MED ORDER — ONDANSETRON HCL 4 MG PO TABS: 4.0000 mg | ORAL_TABLET | Freq: Four times a day (QID) | ORAL | Status: DC | PRN

## 2021-09-18 MED ORDER — ROSUVASTATIN CALCIUM 5 MG PO TABS
5.0000 mg | ORAL_TABLET | Freq: Every day | ORAL | Status: DC
  Administered 2021-09-18 – 2021-09-20 (×3): 5 mg via ORAL
  Filled 2021-09-18 (×4): qty 1

## 2021-09-18 MED ORDER — DORZOLAMIDE HCL-TIMOLOL MAL 2-0.5 % OP SOLN
1.0000 [drp] | Freq: Two times a day (BID) | OPHTHALMIC | Status: DC
  Administered 2021-09-19 – 2021-09-21 (×6): 1 [drp] via OPHTHALMIC
  Filled 2021-09-18: qty 10

## 2021-09-18 MED ORDER — LATANOPROST 0.005 % OP SOLN
1.0000 [drp] | Freq: Every day | OPHTHALMIC | Status: DC
  Administered 2021-09-19 – 2021-09-20 (×3): 1 [drp] via OPHTHALMIC
  Filled 2021-09-18: qty 2.5

## 2021-09-18 MED ORDER — SODIUM CHLORIDE 0.9 % IV SOLN
2.0000 g | Freq: Once | INTRAVENOUS | Status: AC
  Administered 2021-09-18: 2 g via INTRAVENOUS
  Filled 2021-09-18: qty 12.5

## 2021-09-18 MED ORDER — LACTATED RINGERS IV SOLN: INTRAVENOUS | Status: DC

## 2021-09-18 MED ORDER — LATANOPROSTENE BUNOD 0.024 % OP SOLN: 1.0000 [drp] | Freq: Every day | OPHTHALMIC | Status: DC

## 2021-09-18 MED ORDER — DILTIAZEM HCL 25 MG/5ML IV SOLN
10.0000 mg | Freq: Once | INTRAVENOUS | Status: AC
  Administered 2021-09-18: 10 mg via INTRAVENOUS

## 2021-09-18 MED ORDER — VANCOMYCIN HCL IN DEXTROSE 1-5 GM/200ML-% IV SOLN
1000.0000 mg | Freq: Once | INTRAVENOUS | Status: AC
  Administered 2021-09-18: 1000 mg via INTRAVENOUS
  Filled 2021-09-18: qty 200

## 2021-09-18 MED ORDER — SODIUM CHLORIDE 0.9 % IV SOLN
2.0000 g | INTRAVENOUS | Status: DC
  Administered 2021-09-19 – 2021-09-21 (×3): 2 g via INTRAVENOUS
  Filled 2021-09-18 (×3): qty 2

## 2021-09-18 NOTE — Sepsis Progress Note (Signed)
Notified bedside nurse of need to draw repeat lactic acid. 

## 2021-09-18 NOTE — Consult Note (Signed)
PHARMACY -  BRIEF ANTIBIOTIC NOTE  ? ?Pharmacy has received consult(s) for Vancomycin & cefepime from an ED provider.  The patient's profile has been reviewed for ht/wt/allergies/indication/available labs.   ? ?One time order(s) placed for: ?Vancomycin 1g IV x1 ?Cefepime 2g IV x1 ?Pt also receiving Metronidazole '500mg'$  IV x1 ? ?Further antibiotics/pharmacy consults should be ordered by admitting physician if indicated.       ?                ?Thank you, ?Shanon Brow Faline Langer ?09/18/2021  2:51 PM ? ?
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
## 2021-09-18 NOTE — ED Provider Notes (Signed)
Clinical Course as of 09/18/21 1619  ?Tue Sep 18, 2021  ?1505 Patient received in signout from Dr. Ellender Hose pending metabolic panel and reassessment after IV fluids.  Tachycardic in A-fib with RVR with soft pressures.  Severe sepsis, possibly UTI in etiology with associated delirium and metabolic encephalopathy.  Reassuring imaging. [DS]  ?1520 I choose myself to the patient and her son at the bedside.  We discussed pending blood work, medical admission. [DS]  ?1555 Blood pressure is drastically improving with IV fluids, is hypertensive now.  Remains tachycardic in atrial fibrillation with RVR.  We will start diltiazem for rate control consult medicine for admission. [DS]  ?  ?Clinical Course User Index ?[DS] Vladimir Crofts, MD  ? ?.1-3 Lead EKG Interpretation ?Performed by: Vladimir Crofts, MD ?Authorized by: Vladimir Crofts, MD  ? ?  Interpretation: abnormal   ?  ECG rate:  141 ?  ECG rate assessment: tachycardic   ?  Rhythm: atrial fibrillation   ?  Ectopy: none   ?  Conduction: normal   ?.Critical Care ?Performed by: Vladimir Crofts, MD ?Authorized by: Vladimir Crofts, MD  ? ?Critical care provider statement:  ?  Critical care time (minutes):  30 ?  Critical care time was exclusive of:  Separately billable procedures and treating other patients ?  Critical care was necessary to treat or prevent imminent or life-threatening deterioration of the following conditions:  Cardiac failure and circulatory failure ?  Critical care was time spent personally by me on the following activities:  Development of treatment plan with patient or surrogate, discussions with consultants, evaluation of patient's response to treatment, examination of patient, ordering and review of laboratory studies, ordering and review of radiographic studies, ordering and performing treatments and interventions, pulse oximetry, re-evaluation of patient's condition and review of old charts ? ? ? ? ?  ?Vladimir Crofts, MD ?09/18/21 1620 ? ?

## 2021-09-18 NOTE — Consult Note (Signed)
CODE SEPSIS - PHARMACY COMMUNICATION ? ?**Broad Spectrum Antibiotics should be administered within 1 hour of Sepsis diagnosis** ? ?Time Code Sepsis Called/Page Received: 4680 ? ?Antibiotics Ordered: cefepime, vanc, flagyl ? ?Time of 1st antibiotic administration: 1536 ? ?Additional action taken by pharmacy: none ? ?If necessary, Name of Provider/Nurse Contacted: n/a ? ? ? ?Pearla Dubonnet ,PharmD ?Clinical Pharmacist  ?09/18/2021  5:00 PM ? ?

## 2021-09-18 NOTE — Sepsis Progress Note (Signed)
Notified bedside nurse of need to draw blood cultures.  
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
## 2021-09-18 NOTE — Assessment & Plan Note (Addendum)
Patient brought in by her son for evaluation of disorientation which he said is different from her baseline. ?Patient has a history of mild cognitive deficits ?Mental status improved to baseline now.  Can be multifactorial with concern of UTI versus A-fib with RVR causing decreased perfusion. ?-Continue to monitor ?

## 2021-09-18 NOTE — Assessment & Plan Note (Addendum)
As evidenced by low-grade temp, tachycardia, marked leukocytosis, pyuria and lactic acidosis. ?Patient received her sepsis fluid requirement.  And started on Rocephin for concern of UTI. ?Blood cultures negative in 12 hours, no urine cultures were sent, requested as add-on today.  Patient denies any urinary symptoms. ?-Continue Rocephin 2 g IV daily ?-Follow-up results of urine and blood culture ?

## 2021-09-18 NOTE — Assessment & Plan Note (Addendum)
Patient with a history of A-fib noted to be tachycardic and in rapid A-fib ?Received 2 doses of IV Cardizem in the ER and was placed on metoprolol. ?Currently rate well controlled ?-Continue Eliquis as primary prophylaxis for an acute stroke ?-Continue with metoprolol ?

## 2021-09-18 NOTE — Sepsis Progress Note (Signed)
ELink tracking the Code Sepsis. 
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
## 2021-09-19 DIAGNOSIS — A419 Sepsis, unspecified organism: Secondary | ICD-10-CM | POA: Diagnosis not present

## 2021-09-19 DIAGNOSIS — N390 Urinary tract infection, site not specified: Secondary | ICD-10-CM | POA: Diagnosis not present

## 2021-09-19 LAB — BASIC METABOLIC PANEL
Anion gap: 6 (ref 5–15)
BUN: 16 mg/dL (ref 8–23)
CO2: 23 mmol/L (ref 22–32)
Calcium: 8.8 mg/dL — ABNORMAL LOW (ref 8.9–10.3)
Chloride: 108 mmol/L (ref 98–111)
Creatinine, Ser: 0.76 mg/dL (ref 0.44–1.00)
GFR, Estimated: >60 mL/min (ref >60)
Glucose, Bld: 102 mg/dL — ABNORMAL HIGH (ref 70–99)
Potassium: 3.7 mmol/L (ref 3.5–5.1)
Sodium: 137 mmol/L (ref 135–145)

## 2021-09-19 LAB — PROTIME-INR
INR: 1.5 — ABNORMAL HIGH (ref 0.8–1.2)
Prothrombin Time: 17.6 seconds — ABNORMAL HIGH (ref 11.4–15.2)

## 2021-09-19 LAB — CBC
HCT: 38.7 % (ref 36.0–46.0)
Hemoglobin: 12.6 g/dL (ref 12.0–15.0)
MCH: 28.5 pg (ref 26.0–34.0)
MCHC: 32.6 g/dL (ref 30.0–36.0)
MCV: 87.6 fL (ref 80.0–100.0)
Platelets: 175 10*3/uL (ref 150–400)
RBC: 4.42 MIL/uL (ref 3.87–5.11)
RDW: 14.1 % (ref 11.5–15.5)
WBC: 11.7 10*3/uL — ABNORMAL HIGH (ref 4.0–10.5)
nRBC: 0 % (ref 0.0–0.2)

## 2021-09-19 LAB — CORTISOL-AM, BLOOD: Cortisol - AM: 12.3 ug/dL (ref 6.7–22.6)

## 2021-09-19 LAB — PROCALCITONIN: Procalcitonin: <0.1 ng/mL

## 2021-09-19 LAB — LACTIC ACID, PLASMA: Lactic Acid, Venous: 1.4 mmol/L (ref 0.5–1.9)

## 2021-09-19 NOTE — TOC CM/SW Note (Signed)
CSW acknowledges consult for home health/DME needs. Please consult PT and OT so patient can be evaluated. ? ?Dayton Scrape, [place] ?[phone] ? ?
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
## 2021-09-19 NOTE — Progress Notes (Signed)
?   09/19/21 1400  ?Clinical Encounter Type  ?Visited With Patient not available  ?Visit Type Initial  ?Referral From Patient  ?Consult/Referral To Chaplain  ? ?Chaplain Burris atempted to respond to a request for prayer. Pt was sleeping soundly. Will attempt to visit later today, 5/3. ? ?

## 2021-09-19 NOTE — Plan of Care (Signed)

## 2021-09-20 DIAGNOSIS — N390 Urinary tract infection, site not specified: Secondary | ICD-10-CM | POA: Diagnosis not present

## 2021-09-20 DIAGNOSIS — A419 Sepsis, unspecified organism: Secondary | ICD-10-CM | POA: Diagnosis not present

## 2021-09-20 MED ORDER — METOPROLOL TARTRATE 25 MG PO TABS: 25.0000 mg | ORAL_TABLET | Freq: Two times a day (BID) | ORAL | 0 refills | Status: AC

## 2021-09-20 MED ORDER — CEPHALEXIN 500 MG PO CAPS
500.0000 mg | ORAL_CAPSULE | Freq: Two times a day (BID) | ORAL | 0 refills | Status: AC
Start: 2021-09-20 — End: 2021-09-23

## 2021-09-20 MED ORDER — METOPROLOL TARTRATE 25 MG PO TABS
25.0000 mg | ORAL_TABLET | Freq: Two times a day (BID) | ORAL | Status: DC
  Administered 2021-09-20 – 2021-09-21 (×3): 25 mg via ORAL
  Filled 2021-09-20 (×3): qty 1

## 2021-09-20 NOTE — Progress Notes (Signed)
?   09/20/21 1000  ?Clinical Encounter Type  ?Visited With Patient and family together  ?Visit Type Initial  ?Referral From Nurse  ?Spiritual Encounters  ?Spiritual Needs Prayer  ? ?Chaplain responded to Bridget Briggs for prayer.  ?
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
## 2021-09-20 NOTE — Assessment & Plan Note (Addendum)
Patient brought in by her son for evaluation of disorientation which he said is different from her baseline. ?Patient has a history of mild cognitive deficits ?Mental status improved to baseline now.  Can be multifactorial with concern of UTI versus A-fib with RVR causing decreased perfusion. ?-Continue to monitor ?-PT/OT is not recommending rehab and family is interested. ?-TOC to find a place ?

## 2021-09-20 NOTE — Assessment & Plan Note (Signed)
Patient with a history of A-fib noted to be tachycardic and in rapid A-fib ?Received 2 doses of IV Cardizem in the ER and was placed on metoprolol. ?Currently rate well controlled ?-Continue Eliquis as primary prophylaxis for an acute stroke ?-Continue with metoprolol-dose was increased to 25 mg twice daily ?
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
## 2021-09-20 NOTE — TOC Initial Note (Addendum)
Transition of Care (TOC) - Initial/Assessment Note  ? ? ?Patient Details  ?Name: Bridget Briggs ?MRN: [id number] ?Date of Birth: January 24, 1931 ? ?Transition of Care (TOC) CM/SW Contact:    ?Candie Chroman, LCSW ?Phone Number: ?09/20/2021, 12:30 PM ? ?Clinical Narrative:   Patient not fully oriented. Son and daughter-in-law at bedside. CSW introduced role and explained that therapy recommendations would be discussed. Son and daughter-in-law are agreeable to SNF placement. Provided CMS scores for facilities within 25 miles of her zip code. Will send out referral once therapy notes are in and provide bed offers once available. PASARR under manual review. No further concerns. CSW encouraged patient's family to contact CSW as needed. CSW will continue to follow patient for support and facilitate discharge to SNF once bed available and auth obtained.        ? ?1:53 pm: Sent out SNF referral.     ? ?4:08 pm: Reviewed bed offers when son. Asked [facility] admissions coordinator to review referral. Uploaded clinicals into [facility] portal to start [facility] with pending facility. ? ?Expected Discharge Plan: [place] ?Barriers to Discharge: Continued Medical Work up ? ? ?Patient Goals and CMS Choice ?  ?CMS Medicare.gov Compare Post Acute Care list provided to:: Patient Represenative (must comment) (Son and daughter-in-law) ?  ? ?Expected Discharge Plan and Services ?Expected Discharge Plan: [place] ?  ?  ?Post Acute Care Choice: [place] ?Living arrangements for the past 2 months: [place] ?Expected Discharge Date: 09/20/21               ?  ?  ?  ?  ?  ?  ?  ?  ?  ?  ? ?Prior Living Arrangements/Services ?Living arrangements for the past 2 months: [place] ?Lives with:: Self ?Patient language and need for interpreter reviewed:: Yes ?Do you feel safe going back to the place where you live?: Yes      ?Need for Family Participation in Patient Care: Yes  (Comment) ?Care giver support system in place?: Yes (comment) ?  ?Criminal Activity/Legal Involvement Pertinent to Current Situation/Hospitalization: No - Comment as needed ? ?Activities of Daily Living ?Home Assistive Devices/Equipment: Grab bars around toilet, Grab bars in shower, Eyeglasses ?ADL Screening (condition at time of admission) ?Patient's cognitive ability adequate to safely complete daily activities?: Yes (Baseline) ?Is the patient deaf or have difficulty hearing?: Yes ?Does the patient have difficulty seeing, even when wearing glasses/contacts?: No ?Does the patient have difficulty concentrating, remembering, or making decisions?: Yes ?Patient able to express need for assistance with ADLs?: Yes ?Does the patient have difficulty dressing or bathing?: Yes ?Independently performs ADLs?: No ?Communication: Independent ?Dressing (OT): Needs assistance ?Is this a change from baseline?: Change from baseline, expected to last <3days ?Grooming: Needs assistance ?Is this a change from baseline?: Change from baseline, expected to last <3 days ?Feeding: Independent ?Bathing: Needs assistance ?Is this a change from baseline?: Change from baseline, expected to last <3 days ?Toileting: Needs assistance ?Is this a change from baseline?: Change from baseline, expected to last <3 days ?In/Out Bed: Needs assistance ?Is this a change from baseline?: Change from baseline, expected to last <3 days ?Walks in Home: Needs assistance ?Is this a change from baseline?: Change from baseline, expected to last <3 days ?Does the patient have difficulty walking or climbing stairs?: No ?Weakness of Legs: Both ?Weakness of Arms/Hands: None ? ?Permission Sought/Granted ?Permission sought to share information with : Customer service manager, Family  Supports ?  ? Share Information with NAME: Bridget Briggs ? Permission granted to share info w AGENCY: SNF's ? Permission granted to share info w Relationship: Son ? Permission granted  to share info w Contact Information: [phone] ? ?Emotional Assessment ?Appearance:: Appears stated age ?Attitude/Demeanor/Rapport: Engaged ?Affect (typically observed): Calm, Pleasant, Appropriate ?Orientation: : Oriented to Self, Oriented to Place, Oriented to  Time, Oriented to Situation ?Alcohol / Substance Use: Not Applicable ?Psych Involvement: No (comment) ? ?Admission diagnosis:  Sepsis ([place]) [A41.9] ?Patient Active Problem List  ? Diagnosis Date Noted  ? Sepsis secondary to UTI ([place]) 09/18/2021  ? Hx: UTI (urinary tract infection)   ? Acute metabolic encephalopathy   ? Other thrombophilia ([place]) 10/16/2020  ? Cerebral atrophy ([place]) 07/15/2020  ? Dementia ([place]) 04/16/2020  ? Diaphoresis 03/13/2020  ? Change in hearing 01/17/2020  ? Balance problem 01/17/2020  ? Change in vision 11/26/2019  ? Hypercholesterolemia 07/01/2019  ? Elevated TSH 07/02/2018  ? Right hip pain 07/02/2018  ? Sweating abnormality 12/28/2017  ? Aortic atherosclerosis ([place]) 10/29/2016  ? B12 deficiency 10/29/2015  ? Anemia 08/28/2015  ? Cough 06/28/2015  ? Rapid atrial fibrillation ([place]) 06/08/2015  ? Paroxysmal atrial fibrillation ([place]) 06/08/2015  ? Left hip pain 04/05/2015  ? 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 Pain of left thumb 08/06/2014  ? Health care maintenance 08/06/2014  ? Osteoporosis 07/28/2014  ? Bronchiectasis without acute exacerbation ([place]) 01/20/2014  ? Multiple pulmonary nodules 01/20/2014  ? Abnormal CXR 11/02/2013  ? Chest pain 11/01/2013  ? Fatigue 11/01/2013  ? Burning sensation in lower extremity 11/01/2013  ? Environmental allergies 04/19/2013  ? Arthritis 01/18/2013  ? Depression 01/18/2013  ? GERD (gastroesophageal reflux disease) 01/18/2013  ? Glaucoma 01/18/2013  ? History of colonic polyps 01/18/2013  ? Carotid artery disease ([place]) 01/18/2013  ? ?PCP:  Einar Pheasant, MD ?Pharmacy:   ?[place], [address] ?[place] [address] ?Phone:  [phone] Fax: [phone] ? ?[facility] [place], [place] ?[address] ?[place] [address] ?Phone: [phone] Fax: [phone] ? ?[facility], [address] ?[address] ?[place] [address] ?Phone: [phone] Fax: [phone] ? ?[id number] [address], [address] ?[address] ?[place] [address] ?Phone: [phone] Fax: [phone] ? ? ? ? ?Social Determinants of Health (SDOH) Interventions ?  ? ?Readmission Risk Interventions ?   ? View : No data to display.  ?  ?  ?  ? ? ? ?

## 2021-09-20 NOTE — Evaluation (Signed)
Occupational Therapy Evaluation ?Patient Details ?Name: Bridget Briggs ?MRN: [id number] ?DOB: 02-Dec-1930 ?Today's Date: 09/20/2021 ? ? ?History of Present Illness 88 y.o. female with medical history significant for A-fib on chronic anticoagulation therapy, depression, GERD who was brought into the ER by her son for evaluation of mental status changes.  ? ?Clinical Impression ?  ?Pt was seen for OT evaluation this date. Prior to hospital admission, pt was living alone and had family/neighbors checking in on her. Pt typically does not need AD for mobility, however relying more on son for handheld assist when he has visited her over the past couple weeks. Son/pt also report that pt is generally indep with basic ADL and light meal prep. Son provides transportation to Dr appts, picks up/sets up medications (although son reports pt hasn't been consistent with med mgt), and provides groceries. Son clarifies that pt does NOT drive and that a neighbor couple take her to/from church and she has a cleaning lady comes 1x/mo, and sister in law takes her out to eat a couple times per mo. Pt has had at least a couple falls in past 49moper son but pt does not recall.  ? ?Currently pt demonstrates impairments in balance, activity tolerance, strength, and safety as described below (See OT problem list) which functionally limit her ability to perform ADL/self-care tasks, requiring increased assist. Pt currently requires supervision for seated LB ADL tasks, SBA for grooming tasks at the sink., CGA for ADL transfers, and SBA - CGA for mobility improved safety/balance with RW versus HHA versus no UE support. Pt tolerated ambulating ~20'+20' without over LOB but unsteady. HR up to 120's with exertion. Pt/family educated in home/routines modifications, AE/DME to improve safety with ADL/IADL, falls prevention, and OT recommendations for increased supports at home for safety. Pt would benefit from skilled OT services to address noted  impairments and functional limitations (see below for any additional details) in order to maximize safety and independence while minimizing falls risk and caregiver burden. Upon hospital discharge, recommend return to familiar home environment with [facility] increased supports to maximize pt safety and return to functional independence during meaningful occupations of daily life.  ? ?Recommendations for follow up therapy are one component of a multi-disciplinary discharge planning process, led by the attending physician.  Recommendations may be updated based on patient status, additional functional criteria and insurance authorization.  ? ?Follow Up Recommendations ? Home health OT  ?  ?Assistance Recommended at Discharge Frequent or constant Supervision/Assistance  ?Patient can return home with the following A little help with walking and/or transfers;A little help with bathing/dressing/bathroom;Assistance with cooking/housework;Assist for transportation;Help with stairs or ramp for entrance;Direct supervision/assist for medications management;Direct supervision/assist for financial management ? ?  ?Functional Status Assessment ? Patient has had a recent decline in their functional status and demonstrates the ability to make significant improvements in function in a reasonable and predictable amount of time.  ?Equipment Recommendations ? None recommended by OT  ?  ?Recommendations for Other Services   ? ? ?  ?Precautions / Restrictions Precautions ?Precautions: Fall ?Restrictions ?Weight Bearing Restrictions: No  ? ?  ? ?Mobility Bed Mobility ?Overal bed mobility: Needs Assistance ?Bed Mobility: Supine to Sit, Sit to Supine ?  ?  ?Supine to sit: Min assist, HOB elevated ?Sit to supine: Supervision ?  ?General bed mobility comments: increased time/effort, pt reaches out for assist ?  ? ?Transfers ?Overall transfer level: Needs assistance ?Equipment used: None ?Transfers: Sit to/from Stand ?Sit to Stand: Min  guard ?  ?   ?  ?  ?  ?  ?  ? ?  ?Balance Overall balance assessment: Needs assistance ?Sitting-balance support: No upper extremity supported, Feet supported ?Sitting balance-Leahy Scale: Good ?  ?  ?Standing balance support: No upper extremity supported, Single extremity supported, Bilateral upper extremity supported ?Standing balance-Leahy Scale: Fair ?Standing balance comment: balance much improved with RW ?  ?  ?  ?  ?  ?  ?  ?  ?  ?  ?  ?   ? ?ADL either performed or assessed with clinical judgement  ? ?ADL   ?  ?  ?  ?  ?  ?  ?  ?  ?  ?  ?  ?  ?  ?  ?  ?  ?  ?  ?  ?General ADL Comments: Pt able to doff/don socks in bed without assist but did demo mild SOB afterwards. SBA for grooming tasks at the sink. CGA for ADL transfers. SBA - CGA for mobility improved safety/balance with RW versus HHA versus no UE support.  ? ? ? ?Vision   ?   ?   ?Perception   ?  ?Praxis   ?  ? ?Pertinent Vitals/Pain Pain Assessment ?Pain Assessment: No/denies pain  ? ? ? ?Hand Dominance Right ?  ?Extremity/Trunk Assessment Upper Extremity Assessment ?Upper Extremity Assessment: Generalized weakness ?  ?Lower Extremity Assessment ?Lower Extremity Assessment: Generalized weakness ?  ?  ?  ?Communication Communication ?Communication: No difficulties ?  ?Cognition Arousal/Alertness: Awake/alert ?Behavior During Therapy: [facility] for tasks assessed/performed ?Overall Cognitive Status: History of cognitive impairments - at baseline ?  ?  ?  ?  ?  ?  ?  ?  ?  ?  ?  ?  ?  ?  ?  ?  ?General Comments: Son reports hx of mild dementia diagnosis, pt alert and oriented x3 (not to situation), and pt does demo decreased STM, responds well to verbal cues ?  ?  ?General Comments  HR up to 128 with mobility ? ?  ?Exercises Other Exercises ?Other Exercises: Pt/family educated in home/routines modifications, AE/DME to improve safety with ADL/IADL, falls prevention, and OT recommendations for increased supports at home for safety. ?  ?Shoulder Instructions    ? ? ?Home  Living Family/patient expects to be discharged to:: Private residence ?Living Arrangements: Alone ?Available Help at Discharge: Family;Available PRN/intermittently;Available 24 hours/day (son reports he can increase home supports initially until pt/family are able to make a longer term plan) ?Type of Home: House ?Home Access: Stairs to enter ?Entrance Stairs-Number of Steps: threshold from front, 1 step in garage ?  ?Home Layout: One level ?  ?  ?Bathroom Shower/Tub: Tub/shower unit ?  ?Bathroom Toilet: Standard (comfort height) ?  ?  ?Home Equipment: Rollator (4 wheels);Hand held shower head ?  ?  ?  ? ?  ?Prior Functioning/Environment Prior Level of Function : Needs assist;History of Falls (last six months) ? Cognitive Assist : ADLs (cognitive) ?  ?  ?Physical Assist : Mobility (physical);ADLs (physical) ?  ?ADLs (physical): IADLs ?Mobility Comments: no AD at baseline, but increasingly unsteady over past couple weeks and pt relying on handheld assist from son ?ADLs Comments: generally indep with basic ADL and light meal prep; son provides transportation to Dr appts, picks up/sets up medications (although son reports pt hasn't been consistent with med mgt), groceries, neighbor couple take her to/from church, cleaning lady comes 1x/mo, and sister in law takes  her out to eat a couple times per mo. Pt has had at least a couple falls in past 51moper son but pt does not recall. ?  ? ?  ?  ?OT Problem List: Decreased strength;Decreased cognition;Decreased safety awareness;Decreased activity tolerance;Impaired balance (sitting and/or standing);Decreased knowledge of use of DME or AE ?  ?   ?OT Treatment/Interventions: Self-care/ADL training;Therapeutic exercise;Therapeutic activities;Cognitive remediation/compensation;DME and/or AE instruction;Patient/family education;Balance training  ?  ?OT Goals(Current goals can be found in the care plan section) Acute Rehab OT Goals ?Patient Stated Goal: get better ?OT Goal  Formulation: With patient/family ?Time For Goal Achievement: 10/04/21 ?Potential to Achieve Goals: Good ?ADL Goals ?Pt Will Transfer to Toilet: with modified independence;ambulating (LRAD) ?Additional ADL Goal #1: Pt will co
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
## 2021-09-20 NOTE — Assessment & Plan Note (Signed)
As evidenced by low-grade temp, tachycardia, marked leukocytosis, pyuria and lactic acidosis. ?Patient received her sepsis fluid requirement.  And started on Rocephin for concern of UTI. ?Blood cultures negative in 24 hours, no urine cultures were sent, requested as add-on yesterday but apparently there is no urine in the lab.  Patient denies any urinary symptoms. ?-Continue Rocephin 2 g IV daily, can be discharged on Keflex to complete a total of 5-day course ? ?

## 2021-09-20 NOTE — [place] FL2 (Signed)
?[place] MEDICAID FL2 LEVEL OF CARE SCREENING TOOL  ?  ? ?IDENTIFICATION  ?Patient Name: ?Bridget Briggs Birthdate: 11/22/1930 Sex: female Admission Date (Current Location): ?09/18/2021  ?[place] and [place] Number: ? [place] ?  Facility and Address:  ?[facility], [address] [place], [place] [address] ?     Provider Number: ?[id number]  ?Attending Physician Name and Address:  ?Lorella Nimrod, MD ? Relative Name and Phone Number:  ?  ?   ?Current Level of Care: ?Hospital Recommended Level of Care: ?[place] Prior Approval Number: ?  ? ?Date Approved/Denied: ?  PASRR Number: ?Manual review ? ?Discharge Plan: ?SNF ?  ? ?Current Diagnoses: ?Patient Active Problem List  ? Diagnosis Date Noted  ? Sepsis secondary to UTI ([place]) 09/18/2021  ? Hx: UTI (urinary tract infection)   ? Acute metabolic encephalopathy   ? Other thrombophilia ([place]) 10/16/2020  ? Cerebral atrophy ([place]) 07/15/2020  ? Dementia ([place]) 04/16/2020  ? Diaphoresis 03/13/2020  ? Change in hearing 01/17/2020  ? Balance problem 01/17/2020  ? Change in vision 11/26/2019  ? Hypercholesterolemia 07/01/2019  ? Elevated TSH 07/02/2018  ? Right hip pain 07/02/2018  ? Sweating abnormality 12/28/2017  ? Aortic atherosclerosis ([place]) 10/29/2016  ? B12 deficiency 10/29/2015  ? Anemia 08/28/2015  ? Cough 06/28/2015  ? Rapid atrial fibrillation ([place]) 06/08/2015  ? Paroxysmal atrial fibrillation ([place]) 06/08/2015  ? Left hip pain 04/05/2015  ? 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 Pain of left thumb 08/06/2014  ? Health care maintenance 08/06/2014  ? Osteoporosis 07/28/2014  ? Bronchiectasis without acute exacerbation ([place]) 01/20/2014  ? Multiple pulmonary nodules 01/20/2014  ? Abnormal CXR 11/02/2013  ? Chest pain 11/01/2013  ? Fatigue 11/01/2013  ? Burning sensation in lower extremity 11/01/2013  ? Environmental allergies 04/19/2013  ? Arthritis 01/18/2013  ? Depression 01/18/2013  ? GERD (gastroesophageal reflux disease)  01/18/2013  ? Glaucoma 01/18/2013  ? History of colonic polyps 01/18/2013  ? Carotid artery disease ([place]) 01/18/2013  ? ? ?Orientation RESPIRATION BLADDER Height & Weight   ?  ?Self, Place ? Normal Continent Weight: 177 lb 14.6 oz (80.7 kg) ?Height:  '5\' 2"'$  (157.5 cm)  ?BEHAVIORAL SYMPTOMS/MOOD NEUROLOGICAL BOWEL NUTRITION STATUS  ? (None)  (Dementia) Continent Diet (2 gram sodium)  ?AMBULATORY STATUS COMMUNICATION OF NEEDS Skin   ?Limited Assist Verbally Skin abrasions ?  ?  ?  ?    ?     ?     ? ? ?Personal Care Assistance Level of Assistance  ?Bathing, Feeding, Dressing Bathing Assistance: Limited assistance ?Feeding assistance: Limited assistance ?Dressing Assistance: Limited assistance ?   ? ?Functional Limitations Info  ?Sight, Hearing, Speech Sight Info: Adequate ?Hearing Info: Adequate ?Speech Info: Adequate  ? ? ?SPECIAL CARE FACTORS FREQUENCY  ?PT (By licensed PT), OT (By licensed OT)   ?  ?PT Frequency: 5 x week ?OT Frequency: 5 x week ?  ?  ?  ?   ? ? ?Contractures Contractures Info: Not present  ? ? ?Additional Factors Info  ?Code Status, Allergies Code Status Info: Full code ?Allergies Info: Penicillins ?  ?  ?  ?   ? ?Current Medications (09/20/2021):  This is the current hospital active medication list ?Current Facility-Administered Medications  ?Medication Dose Route Frequency Provider Last Rate Last Admin  ? acetaminophen (TYLENOL) tablet 650 mg  650 mg Oral Q6H PRN Agbata, Tochukwu, MD      ? Or  ? acetaminophen (TYLENOL) suppository 650 mg  650  mg Rectal Q6H PRN Agbata, Tochukwu, MD      ? apixaban (ELIQUIS) tablet 5 mg  5 mg Oral BID Agbata, Tochukwu, MD   5 mg at 09/20/21 0952  ? cefTRIAXone (ROCEPHIN) 2 g in sodium chloride 0.9 % 100 mL IVPB  2 g Intravenous Q24H Agbata, Tochukwu, MD   Stopped at 09/20/21 0824  ? dorzolamide-timolol (COSOPT) 22.3-6.8 MG/ML ophthalmic solution 1 drop  1 drop Both Eyes BID Agbata, Tochukwu, MD   1 drop at 09/20/21 0953  ? latanoprost (XALATAN) 0.005 % ophthalmic  solution 1 drop  1 drop Both Eyes QHS Agbata, Tochukwu, MD   1 drop at 09/19/21 2058  ? metoprolol tartrate (LOPRESSOR) tablet 25 mg  25 mg Oral BID Lorella Nimrod, MD   25 mg at 09/20/21 4827  ? ondansetron (ZOFRAN) tablet 4 mg  4 mg Oral Q6H PRN Agbata, Tochukwu, MD      ? Or  ? ondansetron (ZOFRAN) injection 4 mg  4 mg Intravenous Q6H PRN Agbata, Tochukwu, MD      ? rosuvastatin (CRESTOR) tablet 5 mg  5 mg Oral QHS Agbata, Tochukwu, MD   5 mg at 09/19/21 2056  ? ? ? ?Discharge Medications: ?Please see discharge summary for a list of discharge medications. ? ?Relevant Imaging Results: ? ?Relevant Lab Results: ? ? ?Additional Information ?SS#: [id number] ? ?Candie Chroman, LCSW ? ? ? ? ?

## 2021-09-20 NOTE — Plan of Care (Signed)
?  Problem: Education: ?Goal: Knowledge of General Education information will improve ?Description: Including pain rating scale, medication(s)/side effects and non-pharmacologic comfort measures ?09/20/2021 0255 by Bonner Puna, RN ?Outcome: Progressing ?09/20/2021 0104 by Bonner Puna, RN ?Outcome: Progressing ?  ?Problem: Health Behavior/Discharge Planning: ?Goal: Ability to manage health-related needs will improve ?09/20/2021 0255 by Bonner Puna, RN ?Outcome: Progressing ?09/20/2021 0104 by Bonner Puna, RN ?Outcome: Progressing ?  ?Problem: Clinical Measurements: ?Goal: Ability to maintain clinical measurements within normal limits will improve ?09/20/2021 0255 by Bonner Puna, RN ?Outcome: Progressing ?09/20/2021 0104 by Bonner Puna, RN ?Outcome: Progressing ?Goal: Will remain free from infection ?09/20/2021 0255 by Bonner Puna, RN ?Outcome: Progressing ?09/20/2021 0104 by Bonner Puna, RN ?Outcome: Progressing ?Goal: Diagnostic test results will improve ?09/20/2021 0255 by Bonner Puna, RN ?Outcome: Progressing ?09/20/2021 0104 by Bonner Puna, RN ?Outcome: Progressing ?Goal: Respiratory complications will improve ?09/20/2021 0255 by Bonner Puna, RN ?Outcome: Progressing ?09/20/2021 0104 by Bonner Puna, RN ?Outcome: Progressing ?Goal: Cardiovascular complication will be avoided ?09/20/2021 0255 by Bonner Puna, RN ?Outcome: Progressing ?09/20/2021 0104 by Bonner Puna, RN ?Outcome: Progressing ?  ?Problem: Activity: ?Goal: Risk for activity intolerance will decrease ?09/20/2021 0255 by Bonner Puna, RN ?Outcome: Progressing ?09/20/2021 0104 by Bonner Puna, RN ?Outcome: Progressing ?  ?Problem: Nutrition: ?Goal: Adequate nutrition will be maintained ?09/20/2021 0255 by Bonner Puna, RN ?Outcome: Progressing ?09/20/2021 0104 by Bonner Puna, RN ?Outcome: Progressing ?  ?Problem: Coping: ?Goal: Level of anxiety will decrease ?09/20/2021 0255 by Bonner Puna, RN ?Outcome: Progressing ?09/20/2021 0104 by Bonner Puna, RN ?Outcome: Progressing ?  ?Problem:  Elimination: ?Goal: Will not experience complications related to bowel motility ?09/20/2021 0255 by Bonner Puna, RN ?Outcome: Progressing ?09/20/2021 0104 by Bonner Puna, RN ?Outcome: Progressing ?Goal: Will not experience complications related to urinary retention ?09/20/2021 0255 by Bonner Puna, RN ?Outcome: Progressing ?09/20/2021 0104 by Bonner Puna, RN ?Outcome: Progressing ?  ?Problem: Pain Managment: ?Goal: General experience of comfort will improve ?09/20/2021 0255 by Bonner Puna, RN ?Outcome: Progressing ?09/20/2021 0104 by Bonner Puna, RN ?Outcome: Progressing ?  ?Problem: Safety: ?Goal: Ability to remain free from injury will improve ?09/20/2021 0255 by Bonner Puna, RN ?Outcome: Progressing ?09/20/2021 0104 by Bonner Puna, RN ?Outcome: Progressing ?  ?Problem: Skin Integrity: ?Goal: Risk for impaired skin integrity will decrease ?09/20/2021 0255 by Bonner Puna, RN ?Outcome: Progressing ?09/20/2021 0104 by Bonner Puna, RN ?Outcome: Progressing ?  ?Problem: Fluid Volume: ?Goal: Hemodynamic stability will improve ?09/20/2021 0255 by Bonner Puna, RN ?Outcome: Progressing ?09/20/2021 0104 by Bonner Puna, RN ?Outcome: Progressing ?  ?Problem: Clinical Measurements: ?Goal: Diagnostic test results will improve ?09/20/2021 0255 by Bonner Puna, RN ?Outcome: Progressing ?09/20/2021 0104 by Bonner Puna, RN ?Outcome: Progressing ?Goal: Signs and symptoms of infection will decrease ?09/20/2021 0255 by Bonner Puna, RN ?Outcome: Progressing ?09/20/2021 0104 by Bonner Puna, RN ?Outcome: Progressing ?  ?Problem: Respiratory: ?Goal: Ability to maintain adequate ventilation will improve ?09/20/2021 0255 by Bonner Puna, RN ?Outcome: Progressing ?09/20/2021 0104 by Bonner Puna, RN ?Outcome: Progressing ?  ?

## 2021-09-20 NOTE — Plan of Care (Signed)

## 2021-09-21 DIAGNOSIS — R269 Unspecified abnormalities of gait and mobility: Secondary | ICD-10-CM | POA: Diagnosis not present

## 2021-09-21 DIAGNOSIS — R7401 Elevation of levels of liver transaminase levels: Secondary | ICD-10-CM | POA: Diagnosis not present

## 2021-09-21 DIAGNOSIS — I4891 Unspecified atrial fibrillation: Secondary | ICD-10-CM | POA: Diagnosis not present

## 2021-09-21 DIAGNOSIS — R2681 Unsteadiness on feet: Secondary | ICD-10-CM | POA: Diagnosis not present

## 2021-09-21 DIAGNOSIS — N390 Urinary tract infection, site not specified: Secondary | ICD-10-CM | POA: Diagnosis not present

## 2021-09-21 DIAGNOSIS — I482 Chronic atrial fibrillation, unspecified: Secondary | ICD-10-CM | POA: Diagnosis not present

## 2021-09-21 DIAGNOSIS — I10 Essential (primary) hypertension: Secondary | ICD-10-CM | POA: Diagnosis not present

## 2021-09-21 DIAGNOSIS — E785 Hyperlipidemia, unspecified: Secondary | ICD-10-CM | POA: Diagnosis not present

## 2021-09-21 DIAGNOSIS — E039 Hypothyroidism, unspecified: Secondary | ICD-10-CM | POA: Diagnosis not present

## 2021-09-21 DIAGNOSIS — I480 Paroxysmal atrial fibrillation: Secondary | ICD-10-CM | POA: Diagnosis not present

## 2021-09-21 DIAGNOSIS — H26239 Glaucomatous flecks (subcapsular), unspecified eye: Secondary | ICD-10-CM | POA: Diagnosis not present

## 2021-09-21 DIAGNOSIS — A419 Sepsis, unspecified organism: Secondary | ICD-10-CM | POA: Diagnosis not present

## 2021-09-21 DIAGNOSIS — M6259 Muscle wasting and atrophy, not elsewhere classified, multiple sites: Secondary | ICD-10-CM | POA: Diagnosis not present

## 2021-09-21 DIAGNOSIS — R600 Localized edema: Secondary | ICD-10-CM | POA: Diagnosis not present

## 2021-09-21 DIAGNOSIS — M6281 Muscle weakness (generalized): Secondary | ICD-10-CM | POA: Diagnosis not present

## 2021-09-21 DIAGNOSIS — R488 Other symbolic dysfunctions: Secondary | ICD-10-CM | POA: Diagnosis not present

## 2021-09-21 DIAGNOSIS — G9341 Metabolic encephalopathy: Secondary | ICD-10-CM | POA: Diagnosis not present

## 2021-09-21 DIAGNOSIS — I700 Atherosclerosis of aorta: Secondary | ICD-10-CM | POA: Diagnosis not present

## 2021-09-21 DIAGNOSIS — E559 Vitamin D deficiency, unspecified: Secondary | ICD-10-CM | POA: Diagnosis not present

## 2021-09-21 DIAGNOSIS — Z736 Limitation of activities due to disability: Secondary | ICD-10-CM | POA: Diagnosis not present

## 2021-09-21 DIAGNOSIS — I208 Other forms of angina pectoris: Secondary | ICD-10-CM | POA: Diagnosis not present

## 2021-09-21 DIAGNOSIS — F03B0 Unspecified dementia, moderate, without behavioral disturbance, psychotic disturbance, mood disturbance, and anxiety: Secondary | ICD-10-CM | POA: Diagnosis not present

## 2021-09-21 NOTE — TOC Progression Note (Addendum)
Transition of Care (TOC) - Progression Note  ? ? ?Patient Details  ?Name: KARENNA ROMANOFF ?MRN: [id number] ?Date of Birth: 11/23/1930 ? ?Transition of Care (TOC) CM/SW Contact  ?Candie Chroman, LCSW ?Phone Number: ?09/21/2021, 9:29 AM ? ?Clinical Narrative: Patient's son has accepted bed offer from [facility]. Admissions coordinator is aware. [facility] and assigned facility to pending authorization. Son said they feel comfortable transporting her in the car as long as they can get some assistance getting her in and out. ? ?10:52 am: Auth approved but Josem Kaufmann number has not generated yet. Left message for admissions coordinator to see if we need to wait on that before discharging her. ? ?11:13 am: PASARR is under review. Asked MD to sign two progress notes to send to [facility]. ? ?11:52 am: Uploaded clinicals into [place] [facility] for PASARR review. ? ?12:09 pm: PASARR obtained: [id number]. Expires 6/4. ? ?Expected Discharge Plan: [place] ?Barriers to Discharge: Continued Medical Work up ? ?Expected Discharge Plan and Services ?Expected Discharge Plan: [place] ?  ?  ?Post Acute Care Choice: [name] ?Living arrangements for the past 2 months: [place] ?Expected Discharge Date: 09/20/21               ?  ?  ?  ?  ?  ?  ?  ?  ?  ?  ? ? ?Social Determinants of Health (SDOH) Interventions ?  ? ?Readmission Risk Interventions ?   ? View : No data to display.  ?  ?  ?  ? ? ?
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
## 2021-09-21 NOTE — TOC CM/SW Note (Signed)
RE: Bridget Briggs ?Date of Birth: 12/11/1930 ?Date: 09/21/2021 ? ? ?To Whom It May Concern: ? ?Please be advised that the above-named patient will require a short-term nursing home stay - anticipated 30 days or less for rehabilitation and strengthening.  The plan is for return home. ?

## 2021-09-21 NOTE — TOC CM/SW Note (Signed)
Re: Bridget Briggs ?Date of Birth: 02-Oct-1930 ?Date: 09/21/2021 ? ? ?To Whom It May Concern: ? ?Please be advised that the above-name patient's dementia diagnosis is primary and supersedes his mental illness.  ?

## 2021-09-21 NOTE — Care Management Important Message (Signed)
Important Message ? ?Patient Details  ?Name: Bridget Briggs ?MRN: [id number] ?Date of Birth: August 23, 1930 ? ? ?Medicare Important Message Given:  Yes ? ? ? ? ?Dannette Barbara ?09/21/2021, 1:40 PM ?

## 2021-09-21 NOTE — TOC Transition Note (Signed)
Transition of Care (TOC) - CM/SW Discharge Note ? ? ?Patient Details  ?Name: [name] ?MRN: [id number] ?Date of Birth: 13-Feb-1931 ? ?Transition of Care (TOC) CM/SW Contact:  ?Candie Chroman, LCSW ?Phone Number: ?09/21/2021, 1:16 PM ? ? ?Clinical Narrative:  Patient has orders to discharge to [facility] today. RN will call report to [phone] (Room 702). Family will transport her to the facility. Talked with son about potential ALF/ILF placement in [place] after rehab. Made referral to [place] with [facility] to assist with this. No further concerns. CSW signing off. ? ?Final next level of care: [place] ?Barriers to Discharge: Barriers Resolved ? ? ?Patient Goals and CMS Choice ?  ?CMS Medicare.gov Compare Post Acute Care list provided to:: Patient Represenative (must comment) (Son and daughter-in-law) ?Choice offered to / list presented to : Adult Children, Patient ? ?Discharge Placement ?PASRR number recieved: 09/21/21 ?           ?Patient chooses bed at: [facility] [place] ?Patient to be transferred to facility by: Son and daughter-in-law ?Name of family member notified: Airel Magadan ?Patient and family notified of of transfer: 09/21/21 ? ?Discharge Plan and Services ?  ?  ?Post Acute Care Choice: [place]          ?  ?  ?  ?  ?  ?  ?  ?  ?  ?  ? ?Social Determinants of Health (SDOH) Interventions ?  ? ? ?Readmission Risk Interventions ?   ? View : No data to display.  ?  ?  ?  ? ? ? ? ? ?

## 2021-09-21 NOTE — Progress Notes (Signed)
OT Cancellation Note ? ?Patient Details ?Name: Bridget Briggs ?MRN: [id number] ?DOB: 08/10/1930 ? ? ?Cancelled Treatment:    Reason Eval/Treat Not Completed: Other (comment). Pt with nursing for care upon attempt. Will re-attempt at later date/time as pt is available.  ? ?Ardeth Perfect., MPH, MS, OTR/L ?ascom [phone] ?09/21/21, 1:40 PM ? ?

## 2021-09-24 ENCOUNTER — Encounter: Payer: Self-pay | Admitting: Internal Medicine

## 2021-09-24 DIAGNOSIS — R410 Disorientation, unspecified: Secondary | ICD-10-CM | POA: Insufficient documentation

## 2021-09-24 DIAGNOSIS — R269 Unspecified abnormalities of gait and mobility: Secondary | ICD-10-CM | POA: Diagnosis not present

## 2021-09-24 DIAGNOSIS — F03B0 Unspecified dementia, moderate, without behavioral disturbance, psychotic disturbance, mood disturbance, and anxiety: Secondary | ICD-10-CM | POA: Diagnosis not present

## 2021-09-24 DIAGNOSIS — I10 Essential (primary) hypertension: Secondary | ICD-10-CM | POA: Diagnosis not present

## 2021-09-24 DIAGNOSIS — I482 Chronic atrial fibrillation, unspecified: Secondary | ICD-10-CM | POA: Diagnosis not present

## 2021-09-24 NOTE — Assessment & Plan Note (Signed)
Continue crestor 

## 2021-09-24 NOTE — Assessment & Plan Note (Signed)
Saw Dr Delana Meyer.  Carotid ultrasound - no significant stenosis.  ?

## 2021-09-24 NOTE — Telephone Encounter (Signed)
S/w [facility] - advised while pt is in skilled nursing, dr there cares for her. ?When she is discharged from skilled nursing, we will do follow up and discuss next steps - memory care vs palliative care vs assisted living ?

## 2021-09-24 NOTE — Assessment & Plan Note (Signed)
Continue crestor.  Low cholesterol diet and exercise. Follow lipid panel and liver function tests.   

## 2021-09-24 NOTE — Assessment & Plan Note (Signed)
Irregular increased heart rate noted on exam.  EKG - Afib with ventricular rate 130. Has a known history of afib.  On eliquis.  Discussed with pt and son.  Given increased confusion, recent falls and increased heart rate on eliquis, discussed the need for further evaluation and w/up in ER.  They are in agreement and ER notified.  On metoprolol.  ?

## 2021-09-24 NOTE — Assessment & Plan Note (Signed)
Previously saw pulmonary.  Breathing stable.  ?

## 2021-09-24 NOTE — Assessment & Plan Note (Signed)
Acute confusion as outlined.  No focal abnormality or bruising noted on exam.  Recent falls. On eliquis.  Known afib, but with increased ventricular rate 130.  Denies any increased cough or congestion.  No dysuria.  Given acute change in mental status, afib with RVR and recent falls, discussed the need for ER evaluation.  Pt and sone in agreement.  ER notified.   ?

## 2021-09-24 NOTE — Assessment & Plan Note (Signed)
History of afib.  On eliquis.  Stable.  ?

## 2021-09-25 LAB — CULTURE, BLOOD (SINGLE)
Culture: NO GROWTH
Culture: NO GROWTH
Special Requests: ADEQUATE
Special Requests: ADEQUATE

## 2021-09-26 ENCOUNTER — Ambulatory Visit (INDEPENDENT_AMBULATORY_CARE_PROVIDER_SITE_OTHER): Payer: Medicare Other | Admitting: *Deleted

## 2021-09-26 DIAGNOSIS — G319 Degenerative disease of nervous system, unspecified: Secondary | ICD-10-CM

## 2021-09-26 DIAGNOSIS — F0390 Unspecified dementia without behavioral disturbance: Secondary | ICD-10-CM

## 2021-09-27 DIAGNOSIS — R7401 Elevation of levels of liver transaminase levels: Secondary | ICD-10-CM | POA: Diagnosis not present

## 2021-09-27 DIAGNOSIS — R269 Unspecified abnormalities of gait and mobility: Secondary | ICD-10-CM | POA: Diagnosis not present

## 2021-09-27 DIAGNOSIS — I482 Chronic atrial fibrillation, unspecified: Secondary | ICD-10-CM | POA: Diagnosis not present

## 2021-09-27 DIAGNOSIS — I10 Essential (primary) hypertension: Secondary | ICD-10-CM | POA: Diagnosis not present

## 2021-09-27 NOTE — Patient Instructions (Addendum)
Visit Information ? ?Thank you for taking time to visit with me today. Please don't hesitate to contact me if I can be of assistance to you before our next scheduled telephone appointment. ? ?Following are the goals we discussed today:  ?- begin a notebook of services in my neighborhood or community ?- follow-up on any referrals for help I am given ?- think ahead to make sure my need does not become an emergency ?- have a back-up plan  ?-patient's son will continue to seek longterm placement for patient in the [place] area ?-patient's son to continue to follow up with [facility] to assist with long term care options ? ?Please call the care guide team at [phone] if you need to cancel or reschedule your appointment.  ? ?If you are experiencing a Mental Health or [place] or need someone to talk to, please call the Suicide and Crisis Lifeline: 988  ? ?Patient verbalizes understanding of instructions and care plan provided today and agrees to view in [place]. Active MyChart status confirmed with patient.   ? ?No further follow up required: patient's son working towards identifying a higher level of care in [place], [place] with the assistance of [facility] ? ?Jatinder Mcdonagh, LCSW ?[place] ?[phone] ? ?

## 2021-09-27 NOTE — Chronic Care Management (AMB) (Signed)
?Chronic Care Management  ? ? Clinical Social Work Note ? ?09/27/2021 ?Name: Bridget Briggs MRN: [id number] DOB: [birth year] ? ?Bridget Briggs is a 88 y.o. year old female who is a primary care patient of Einar Pheasant, MD. The CCM team was consulted to assist the patient with chronic disease management and/or care coordination needs related to: Level of Care Concerns.  ? ?Engaged with patient;s son  by telephone for follow up visit in response to provider referral for social work chronic care management and care coordination services.  ? ?Consent to Services:  ?The patient was given information about Chronic Care Management services, agreed to services, and gave verbal consent prior to initiation of services.  Please see initial visit note for detailed documentation.  ? ?Patient agreed to services and consent obtained.  ? ?Assessment: Review of patient past medical history, allergies, medications, and health status, including review of relevant consultants reports was performed today as part of a comprehensive evaluation and provision of chronic care management and care coordination services.    ? ?SDOH (Social Determinants of Health) assessments and interventions performed:   ? ?Advanced Directives Status: Not addressed in this encounter. ? ?CCM Care Plan ? ?Allergies  ?Allergen Reactions  ? Penicillins Other (See Comments)  ?  Has patient had a PCN reaction causing immediate rash, facial/tongue/throat swelling, SOB or lightheadedness with hypotension: no  ?Has patient had a PCN reaction causing severe rash involving mucus membranes or skin necrosis: no ?Has patient had a PCN reaction that required hospitalization: no ?Has patient had a PCN reaction occurring within the last 10 years: yes  ?If all of the above answers are "NO", then may proceed with Cephalosporin use. ?  ? ? ?Outpatient Encounter Medications as of 09/26/2021  ?Medication Sig  ? dorzolamide-timolol (COSOPT) 22.3-6.8 MG/ML ophthalmic solution Place  1 drop into both eyes 2 (two) times daily.  ? ELIQUIS 5 MG TABS tablet TAKE 1 TABLET(5 MG) BY MOUTH TWICE DAILY  ? latanoprost (XALATAN) 0.005 % ophthalmic solution Place 1 drop into both eyes at bedtime.  ? metoprolol tartrate (LOPRESSOR) 25 MG tablet Take 1 tablet (25 mg total) by mouth 2 (two) times daily.  ? rosuvastatin (CRESTOR) 5 MG tablet Take 1 tablet (5 mg total) by mouth daily.  ? VYZULTA 0.024 % SOLN Apply 1 drop to eye at bedtime.  ? ?No facility-administered encounter medications on file as of 09/26/2021.  ? ? ?Patient Active Problem List  ? Diagnosis Date Noted  ? Confusion 09/24/2021  ? Sepsis secondary to UTI ([place]) 09/18/2021  ? Hx: UTI (urinary tract infection)   ? Acute metabolic encephalopathy   ? Other thrombophilia ([place]) 10/16/2020  ? Cerebral atrophy ([place]) 07/15/2020  ? Dementia ([place]) 04/16/2020  ? Diaphoresis 03/13/2020  ? Change in hearing 01/17/2020  ? Balance problem 01/17/2020  ? Change in vision 11/26/2019  ? Hypercholesterolemia 07/01/2019  ? Elevated TSH 07/02/2018  ? Right hip pain 07/02/2018  ? Sweating abnormality 12/28/2017  ? Aortic atherosclerosis ([place]) 10/29/2016  ? B12 deficiency 10/29/2015  ? Anemia 08/28/2015  ? Cough 06/28/2015  ? Rapid atrial fibrillation ([place]) 06/08/2015  ? Paroxysmal atrial fibrillation ([place]) 06/08/2015  ? Left hip pain 04/05/2015  ? 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 Pain of left thumb 08/06/2014  ? Health care maintenance 08/06/2014  ? Osteoporosis 07/28/2014  ? Bronchiectasis without acute exacerbation ([place]) 01/20/2014  ? Multiple pulmonary nodules 01/20/2014  ? Abnormal CXR 11/02/2013  ? Chest pain 11/01/2013  ? Fatigue 11/01/2013  ?  Burning sensation in lower extremity 11/01/2013  ? Environmental allergies 04/19/2013  ? Arthritis 01/18/2013  ? Depression 01/18/2013  ? GERD (gastroesophageal reflux disease) 01/18/2013  ? Glaucoma 01/18/2013  ? History of colonic polyps 01/18/2013  ? Carotid artery disease ([place]) 01/18/2013  ? ? ?Conditions to  be addressed/monitored: Dementia; Memory Deficits ? ?Care Plan : General Social Work (Adult)  ?Updates made by Vern Claude, LCSW since 09/27/2021 12:00 AM  ?  ? ?Problem: [facility] AMB "PATIENT-SPECIFIC PROBLEM"   ?Note:   ?CARE PLAN ENTRY ?(see longitudinal plan of care for additional care plan information) ? ?Current Barriers:  ?Patient with Dementia in need of assistance with connection to community resources  ?Knowledge deficits and need for support, education and care coordination related to community resources support  ?Limited social support, ADL IADL limitations, Memory Deficits, and Lacks knowledge of community resource: related to in home care needs and Day Program possibilities ? ?Clinical Goal(s)  ?Over the next 90 days, patient's son will work with care management team member to address concerns related to in home care needs ? ?Interventions provided by LCSW:  ?Continued to assess patient's care coordination needs related to in home care needs and discussed ongoing care management follow up  ?Confirmed with patient's son that patient had a recent hospital stay and is now in rehab ?Patient's son discussed concern that patient is no longer safe in her home alone and is in need of a higher level of care ?Confirmed son's plan to arrange for a long term care placement for patient in [place](closer to his home) post discharge from rehab and has 2 appointments to tour possible facilities ?Patient's son now working with [facility] [employer] to assist with linkage to possible long term care facilities and will plan for patient to move in with him if placement is not available before her discharge from rehab. ?Patient's son encouraged to call this social worker with any additional community resource needs ?Patient Self Care Activities & Deficits:  ?Patient is unable to independently navigate community resource options without care coordination support  ?Acknowledges deficits and is motivated to resolve concern  ?Does not  adhere to prescribed medication regimen ?Unable to perform IADLs independently ?Attends all scheduled provider appointments ?Strong support from son ? ?Please see past updates related to this goal by clicking on the "Past Updates" button in the selected goal  ? ? ?  ?  ? ?Follow Up Plan:  Client's son will contact this social worker with any additional assistance needs with long term care placement ?     ? ?Alba Perillo, LCSW ?Clinical Social Worker  ?[facility] ?[phone] ? ? ? ?

## 2021-09-28 ENCOUNTER — Inpatient Hospital Stay: Payer: Medicare Other | Admitting: Internal Medicine

## 2021-10-02 DIAGNOSIS — R269 Unspecified abnormalities of gait and mobility: Secondary | ICD-10-CM | POA: Diagnosis not present

## 2021-10-02 DIAGNOSIS — R600 Localized edema: Secondary | ICD-10-CM | POA: Diagnosis not present

## 2021-10-02 DIAGNOSIS — I480 Paroxysmal atrial fibrillation: Secondary | ICD-10-CM | POA: Diagnosis not present

## 2021-10-02 DIAGNOSIS — I700 Atherosclerosis of aorta: Secondary | ICD-10-CM | POA: Diagnosis not present

## 2021-10-02 DIAGNOSIS — I208 Other forms of angina pectoris: Secondary | ICD-10-CM | POA: Diagnosis not present

## 2021-10-02 DIAGNOSIS — I10 Essential (primary) hypertension: Secondary | ICD-10-CM | POA: Diagnosis not present

## 2021-10-03 ENCOUNTER — Encounter: Payer: Self-pay | Admitting: Internal Medicine

## 2021-10-05 DIAGNOSIS — F03B0 Unspecified dementia, moderate, without behavioral disturbance, psychotic disturbance, mood disturbance, and anxiety: Secondary | ICD-10-CM | POA: Diagnosis not present

## 2021-10-05 DIAGNOSIS — I10 Essential (primary) hypertension: Secondary | ICD-10-CM | POA: Diagnosis not present

## 2021-10-05 DIAGNOSIS — R269 Unspecified abnormalities of gait and mobility: Secondary | ICD-10-CM | POA: Diagnosis not present

## 2021-10-05 DIAGNOSIS — I482 Chronic atrial fibrillation, unspecified: Secondary | ICD-10-CM | POA: Diagnosis not present

## 2021-10-06 DIAGNOSIS — Z736 Limitation of activities due to disability: Secondary | ICD-10-CM | POA: Diagnosis not present

## 2021-10-06 DIAGNOSIS — M6281 Muscle weakness (generalized): Secondary | ICD-10-CM | POA: Diagnosis not present

## 2021-10-06 DIAGNOSIS — E785 Hyperlipidemia, unspecified: Secondary | ICD-10-CM | POA: Diagnosis not present

## 2021-10-06 DIAGNOSIS — I4891 Unspecified atrial fibrillation: Secondary | ICD-10-CM | POA: Diagnosis not present

## 2021-10-06 DIAGNOSIS — H26239 Glaucomatous flecks (subcapsular), unspecified eye: Secondary | ICD-10-CM | POA: Diagnosis not present

## 2021-10-06 DIAGNOSIS — R2681 Unsteadiness on feet: Secondary | ICD-10-CM | POA: Diagnosis not present

## 2021-10-06 DIAGNOSIS — G9341 Metabolic encephalopathy: Secondary | ICD-10-CM | POA: Diagnosis not present

## 2021-10-06 DIAGNOSIS — I10 Essential (primary) hypertension: Secondary | ICD-10-CM | POA: Diagnosis not present

## 2021-10-11 DIAGNOSIS — G3184 Mild cognitive impairment, so stated: Secondary | ICD-10-CM | POA: Diagnosis not present

## 2021-10-11 DIAGNOSIS — I482 Chronic atrial fibrillation, unspecified: Secondary | ICD-10-CM | POA: Diagnosis not present

## 2021-10-11 DIAGNOSIS — K219 Gastro-esophageal reflux disease without esophagitis: Secondary | ICD-10-CM | POA: Diagnosis not present

## 2021-10-11 DIAGNOSIS — D649 Anemia, unspecified: Secondary | ICD-10-CM | POA: Diagnosis not present

## 2021-10-15 DIAGNOSIS — F4321 Adjustment disorder with depressed mood: Secondary | ICD-10-CM | POA: Diagnosis not present

## 2021-10-15 DIAGNOSIS — G3184 Mild cognitive impairment, so stated: Secondary | ICD-10-CM | POA: Diagnosis not present

## 2021-10-17 DIAGNOSIS — F0390 Unspecified dementia without behavioral disturbance: Secondary | ICD-10-CM

## 2021-10-18 ENCOUNTER — Encounter: Payer: Self-pay | Admitting: Internal Medicine

## 2021-10-25 DIAGNOSIS — G3184 Mild cognitive impairment, so stated: Secondary | ICD-10-CM | POA: Diagnosis not present

## 2021-10-25 DIAGNOSIS — F4321 Adjustment disorder with depressed mood: Secondary | ICD-10-CM | POA: Diagnosis not present

## 2021-11-01 DIAGNOSIS — U071 COVID-19: Secondary | ICD-10-CM | POA: Diagnosis not present

## 2021-11-06 DIAGNOSIS — G3183 Dementia with Lewy bodies: Secondary | ICD-10-CM | POA: Diagnosis not present

## 2021-11-06 DIAGNOSIS — F0280 Dementia in other diseases classified elsewhere without behavioral disturbance: Secondary | ICD-10-CM | POA: Diagnosis not present

## 2021-11-30 ENCOUNTER — Encounter: Payer: Self-pay | Admitting: Internal Medicine

## 2021-12-03 DIAGNOSIS — G3184 Mild cognitive impairment, so stated: Secondary | ICD-10-CM | POA: Diagnosis not present

## 2021-12-03 DIAGNOSIS — F4321 Adjustment disorder with depressed mood: Secondary | ICD-10-CM | POA: Diagnosis not present

## 2022-03-18 ENCOUNTER — Encounter (INDEPENDENT_AMBULATORY_CARE_PROVIDER_SITE_OTHER): Payer: Self-pay

## 2022-04-05 ENCOUNTER — Telehealth: Payer: Self-pay | Admitting: Internal Medicine

## 2022-04-05 NOTE — Telephone Encounter (Signed)
Pt son would like to be called in regards to the pt allergies

## 2022-04-08 NOTE — Telephone Encounter (Signed)
S/w pt - wanted to confirm pt is allergic to PCN - pt advised yes
